# Patient Record
Sex: Female | Born: 1955 | Race: Black or African American | Hispanic: No | Marital: Single | Smoking: Former smoker
Health system: Southern US, Community
[De-identification: ages and names within clinical notes are randomized; demographics above are authoritative.]

## PROBLEM LIST (undated history)

## (undated) DIAGNOSIS — I639 Cerebral infarction, unspecified: Secondary | ICD-10-CM

## (undated) DIAGNOSIS — I10 Essential (primary) hypertension: Secondary | ICD-10-CM

## (undated) HISTORY — PX: TONSILLECTOMY: SUR1361

## (undated) HISTORY — PX: ABDOMINAL HYSTERECTOMY: SHX81

---

## 2019-07-21 ENCOUNTER — Other Ambulatory Visit: Payer: Self-pay

## 2019-07-21 ENCOUNTER — Emergency Department (HOSPITAL_BASED_OUTPATIENT_CLINIC_OR_DEPARTMENT_OTHER): Payer: Commercial Managed Care - PPO

## 2019-07-21 ENCOUNTER — Encounter (HOSPITAL_BASED_OUTPATIENT_CLINIC_OR_DEPARTMENT_OTHER): Payer: Self-pay | Admitting: *Deleted

## 2019-07-21 ENCOUNTER — Inpatient Hospital Stay (HOSPITAL_COMMUNITY): Payer: Commercial Managed Care - PPO

## 2019-07-21 ENCOUNTER — Inpatient Hospital Stay (HOSPITAL_BASED_OUTPATIENT_CLINIC_OR_DEPARTMENT_OTHER)
Admission: EM | Admit: 2019-07-21 | Discharge: 2019-07-25 | DRG: 065 | Disposition: A | Discharge status: Home Health | Payer: Commercial Managed Care - PPO | Attending: Neurology | Admitting: Neurology

## 2019-07-21 DIAGNOSIS — Z6829 Body mass index (BMI) 29.0-29.9, adult: Secondary | ICD-10-CM

## 2019-07-21 DIAGNOSIS — R29702 NIHSS score 2: Secondary | ICD-10-CM | POA: Diagnosis present

## 2019-07-21 DIAGNOSIS — E854 Organ-limited amyloidosis: Secondary | ICD-10-CM | POA: Diagnosis present

## 2019-07-21 DIAGNOSIS — I619 Nontraumatic intracerebral hemorrhage, unspecified: Secondary | ICD-10-CM | POA: Diagnosis present

## 2019-07-21 DIAGNOSIS — Z20828 Contact with and (suspected) exposure to other viral communicable diseases: Secondary | ICD-10-CM | POA: Diagnosis present

## 2019-07-21 DIAGNOSIS — Z79899 Other long term (current) drug therapy: Secondary | ICD-10-CM | POA: Diagnosis not present

## 2019-07-21 DIAGNOSIS — I10 Essential (primary) hypertension: Secondary | ICD-10-CM | POA: Diagnosis present

## 2019-07-21 DIAGNOSIS — I611 Nontraumatic intracerebral hemorrhage in hemisphere, cortical: Principal | ICD-10-CM | POA: Diagnosis present

## 2019-07-21 DIAGNOSIS — I6389 Other cerebral infarction: Secondary | ICD-10-CM | POA: Diagnosis not present

## 2019-07-21 DIAGNOSIS — E669 Obesity, unspecified: Secondary | ICD-10-CM | POA: Diagnosis present

## 2019-07-21 DIAGNOSIS — Z79890 Hormone replacement therapy (postmenopausal): Secondary | ICD-10-CM

## 2019-07-21 DIAGNOSIS — H53462 Homonymous bilateral field defects, left side: Secondary | ICD-10-CM | POA: Diagnosis present

## 2019-07-21 DIAGNOSIS — E871 Hypo-osmolality and hyponatremia: Secondary | ICD-10-CM | POA: Diagnosis present

## 2019-07-21 DIAGNOSIS — I680 Cerebral amyloid angiopathy: Secondary | ICD-10-CM | POA: Diagnosis present

## 2019-07-21 DIAGNOSIS — I629 Nontraumatic intracranial hemorrhage, unspecified: Secondary | ICD-10-CM

## 2019-07-21 DIAGNOSIS — R739 Hyperglycemia, unspecified: Secondary | ICD-10-CM | POA: Diagnosis not present

## 2019-07-21 DIAGNOSIS — E785 Hyperlipidemia, unspecified: Secondary | ICD-10-CM | POA: Diagnosis present

## 2019-07-21 DIAGNOSIS — E7800 Pure hypercholesterolemia, unspecified: Secondary | ICD-10-CM | POA: Diagnosis not present

## 2019-07-21 HISTORY — DX: Essential (primary) hypertension: I10

## 2019-07-21 LAB — DIFFERENTIAL
Abs Immature Granulocytes: 0.02 10*3/uL (ref 0.00–0.07)
Basophils Absolute: 0 10*3/uL (ref 0.0–0.1)
Basophils Relative: 0 %
Eosinophils Absolute: 0 10*3/uL (ref 0.0–0.5)
Eosinophils Relative: 0 %
Immature Granulocytes: 0 %
Lymphocytes Relative: 24 %
Lymphs Abs: 1.8 10*3/uL (ref 0.7–4.0)
Monocytes Absolute: 0.6 10*3/uL (ref 0.1–1.0)
Monocytes Relative: 8 %
Neutro Abs: 5 10*3/uL (ref 1.7–7.7)
Neutrophils Relative %: 68 %

## 2019-07-21 LAB — COMPREHENSIVE METABOLIC PANEL
ALT: 20 U/L (ref 0–44)
AST: 23 U/L (ref 15–41)
Albumin: 4.5 g/dL (ref 3.5–5.0)
Alkaline Phosphatase: 53 U/L (ref 38–126)
Anion gap: 11 (ref 5–15)
BUN: 11 mg/dL (ref 6–20)
CO2: 23 mmol/L (ref 22–32)
Calcium: 9.3 mg/dL (ref 8.9–10.3)
Chloride: 100 mmol/L (ref 98–111)
Creatinine, Ser: 0.91 mg/dL (ref 0.44–1.00)
GFR calc Af Amer: >60 mL/min (ref >60)
GFR calc non Af Amer: >60 mL/min (ref >60)
Glucose, Bld: 172 mg/dL — ABNORMAL HIGH (ref 70–99)
Potassium: 3.9 mmol/L (ref 3.5–5.1)
Sodium: 134 mmol/L — ABNORMAL LOW (ref 135–145)
Total Bilirubin: 1 mg/dL (ref 0.3–1.2)
Total Protein: 8.2 g/dL — ABNORMAL HIGH (ref 6.5–8.1)

## 2019-07-21 LAB — CBG MONITORING, ED: Glucose-Capillary: 155 mg/dL — ABNORMAL HIGH (ref 70–99)

## 2019-07-21 LAB — CBC
HCT: 44.7 % (ref 36.0–46.0)
HCT: 39.7 % (ref 36.0–46.0)
Hemoglobin: 14.3 g/dL (ref 12.0–15.0)
Hemoglobin: 12.8 g/dL (ref 12.0–15.0)
MCH: 28.4 pg (ref 26.0–34.0)
MCH: 28.3 pg (ref 26.0–34.0)
MCHC: 32 g/dL (ref 30.0–36.0)
MCHC: 32.2 g/dL (ref 30.0–36.0)
MCV: 88.9 fL (ref 80.0–100.0)
MCV: 87.6 fL (ref 80.0–100.0)
Platelets: 256 10*3/uL (ref 150–400)
Platelets: 248 10*3/uL (ref 150–400)
RBC: 5.03 MIL/uL (ref 3.87–5.11)
RBC: 4.53 MIL/uL (ref 3.87–5.11)
RDW: 14.4 % (ref 11.5–15.5)
RDW: 14.4 % (ref 11.5–15.5)
WBC: 7.5 10*3/uL (ref 4.0–10.5)
WBC: 7.3 10*3/uL (ref 4.0–10.5)
nRBC: 0 % (ref 0.0–0.2)
nRBC: 0 % (ref 0.0–0.2)

## 2019-07-21 LAB — PROTIME-INR
INR: 1 (ref 0.8–1.2)
Prothrombin Time: 12.6 seconds (ref 11.4–15.2)

## 2019-07-21 LAB — MRSA PCR SCREENING: MRSA by PCR: NEGATIVE

## 2019-07-21 LAB — APTT: aPTT: 27 seconds (ref 24–36)

## 2019-07-21 LAB — SARS CORONAVIRUS 2 AG (30 MIN TAT): SARS Coronavirus 2 Ag: NEGATIVE

## 2019-07-21 LAB — HIV ANTIBODY (ROUTINE TESTING W REFLEX): HIV Screen 4th Generation wRfx: NONREACTIVE

## 2019-07-21 MED ORDER — ACETAMINOPHEN 325 MG PO TABS
650.0000 mg | ORAL_TABLET | ORAL | Status: DC | PRN
  Administered 2019-07-24: 650 mg via ORAL
  Filled 2019-07-21: qty 2

## 2019-07-21 MED ORDER — PANTOPRAZOLE SODIUM 40 MG IV SOLR
40.0000 mg | Freq: Every day | INTRAVENOUS | Status: DC
  Administered 2019-07-21: 40 mg via INTRAVENOUS
  Filled 2019-07-21: qty 40

## 2019-07-21 MED ORDER — ACETAMINOPHEN 650 MG RE SUPP: 650.0000 mg | RECTAL | Status: DC | PRN

## 2019-07-21 MED ORDER — AMLODIPINE BESYLATE 5 MG PO TABS
5.0000 mg | ORAL_TABLET | Freq: Every day | ORAL | Status: DC
  Administered 2019-07-22 – 2019-07-25 (×4): 5 mg via ORAL
  Filled 2019-07-21 (×4): qty 1

## 2019-07-21 MED ORDER — SODIUM CHLORIDE 0.9 % IV BOLUS
1000.0000 mL | Freq: Once | INTRAVENOUS | Status: AC
  Administered 2019-07-21: 1000 mL via INTRAVENOUS

## 2019-07-21 MED ORDER — ACETAMINOPHEN 160 MG/5ML PO SOLN: 650.0000 mg | ORAL | Status: DC | PRN

## 2019-07-21 MED ORDER — ONDANSETRON HCL 4 MG/2ML IJ SOLN
4.0000 mg | Freq: Once | INTRAMUSCULAR | Status: AC
  Administered 2019-07-21: 4 mg via INTRAVENOUS
  Filled 2019-07-21: qty 2

## 2019-07-21 MED ORDER — POTASSIUM CHLORIDE 10 MEQ/100ML IV SOLN
10.0000 meq | INTRAVENOUS | Status: DC
  Administered 2019-07-21: 10 meq via INTRAVENOUS
  Filled 2019-07-21: qty 100

## 2019-07-21 MED ORDER — MORPHINE SULFATE (PF) 4 MG/ML IV SOLN
4.0000 mg | Freq: Once | INTRAVENOUS | Status: AC
  Administered 2019-07-21: 4 mg via INTRAVENOUS
  Filled 2019-07-21: qty 1

## 2019-07-21 MED ORDER — CLEVIDIPINE BUTYRATE 0.5 MG/ML IV EMUL
0.0000 mg/h | INTRAVENOUS | Status: DC
  Administered 2019-07-21: 2 mg/h via INTRAVENOUS
  Administered 2019-07-22: 3 mg/h via INTRAVENOUS
  Filled 2019-07-21: qty 100
  Filled 2019-07-21 (×2): qty 50

## 2019-07-21 MED ORDER — CHLORHEXIDINE GLUCONATE CLOTH 2 % EX PADS
6.0000 | MEDICATED_PAD | Freq: Every day | CUTANEOUS | Status: DC
  Administered 2019-07-21 – 2019-07-24 (×4): 6 via TOPICAL

## 2019-07-21 MED ORDER — SENNOSIDES-DOCUSATE SODIUM 8.6-50 MG PO TABS
1.0000 | ORAL_TABLET | Freq: Two times a day (BID) | ORAL | Status: DC
  Administered 2019-07-21 – 2019-07-24 (×5): 1 via ORAL
  Filled 2019-07-21 (×8): qty 1

## 2019-07-21 MED ORDER — STROKE: EARLY STAGES OF RECOVERY BOOK
Freq: Once | Status: AC
  Administered 2019-07-21: 21:00:00
  Filled 2019-07-21: qty 1

## 2019-07-21 NOTE — H&P (Signed)
Admission H&P    Chief Complaint: Acute right parietal ICH.   HPI: Stacy Flores is an 63 y.o. female who presented this AM to the [facility] ED with confusion. LKN was on Wednesday. Later that day the patient's daughter was contacted by patient's significant other who stated she was having difficulty getting her pants on. She was complaining of a headache. Today, daughter was called again with report that patient was having difficulty walking and had put her pants on backwards. She also fell into the wall. She was taken to the ED, where she reported a right frontal headache with onset last night suddenly. A CT head was obtained, revealing a large right parietal lobe ICH.    Past Medical History:  Diagnosis Date  . Hypertension     Past Surgical History:  Procedure Laterality Date  . ABDOMINAL HYSTERECTOMY    . TONSILLECTOMY      No family history on file. Social History:  reports that she has never smoked. She has never used smokeless tobacco. She reports current alcohol use. She reports that she does not use drugs.  Allergies: No Known Allergies  Home medications: Amlodipine Estradiol  ROS: As per HPI. Endorses no additional complaints.   Physical Examination: Blood pressure 137/82, pulse 95, temperature 99.8 F (37.7 C), temperature source Oral, resp. rate 14, height 5\' 2"  (1.575 m), weight 72.6 kg, SpO2 100 %.  HEENT-  /AT  Lungs - Respirations unlabored Extremities - Warm and well perfused  Neurologic Examination: Mental Status: Awake with decreased level of alertness. Increased latencies of verbal and motor responses. Speech fluent without dysarthria. Able to name objects and follow all commands.  Cranial Nerves: II:  Left visual field cut. PERRL.  III,IV, VI: EOM are full with hesitancy on gazing to the left.  V,VII: Smile symmetric, facial temp sensation equal bilaterally VIII: hearing intact to voice IX,X: No hypophonia XI: Head is at the midline XII: midline tongue  extension Motor: RUE and RLE 5/5 LUE 5/5 except for 4/5 triceps LLE 5/5 except for 3/5 ADF and APF Sensory: Temp decreased to LLE, otherwise normal sensation to temp and FT. Positive for extinction on the left to DSS.    Deep Tendon Reflexes:  2+ bilateral brachioradialis and biceps. 3+ right patellar 4+ left patellar Plantars: Right: downgoing   Left: downgoing Cerebellar: No ataxia with FNF bilaterally  Gait: Deferred  Results for orders placed or performed during the hospital encounter of 07/21/19 (from the past 48 hour(s))  CBG monitoring, ED     Status: Abnormal   Collection Time: 07/21/19 11:33 AM  Result Value Ref Range   Glucose-Capillary 155 (H) 70 - 99 mg/dL  Protime-INR     Status: None   Collection Time: 07/21/19 11:48 AM  Result Value Ref Range   Prothrombin Time 12.6 11.4 - 15.2 seconds   INR 1.0 0.8 - 1.2    Comment: (NOTE) INR goal varies based on device and disease states. Performed at [facility], [address] [place], [place] [address]   APTT     Status: None   Collection Time: 07/21/19 11:48 AM  Result Value Ref Range   aPTT 27 24 - 36 seconds    Comment: Performed at [facility], [address] [place], [place] [address]  CBC     Status: None   Collection Time: 07/21/19 11:48 AM  Result Value Ref Range   WBC 7.5 4.0 - 10.5 K/uL   RBC 5.03  3.87 - 5.11 MIL/uL   Hemoglobin 14.3 12.0 - 15.0 g/dL   HCT 44.7 36.0 - 46.0 %   MCV 88.9 80.0 - 100.0 fL   MCH 28.4 26.0 - 34.0 pg   MCHC 32.0 30.0 - 36.0 g/dL   RDW 14.4 11.5 - 15.5 %   Platelets 256 150 - 400 K/uL   nRBC 0.0 0.0 - 0.2 %    Comment: Performed at [facility], [address] [place], [place] [address]  Differential     Status: None   Collection Time: 07/21/19 11:48 AM  Result Value Ref Range   Neutrophils Relative % 68 %   Neutro Abs 5.0 1.7 - 7.7 K/uL   Lymphocytes Relative 24 %   Lymphs Abs 1.8 0.7 - 4.0 K/uL   Monocytes Relative 8 %   Monocytes  Absolute 0.6 0.1 - 1.0 K/uL   Eosinophils Relative 0 %   Eosinophils Absolute 0.0 0.0 - 0.5 K/uL   Basophils Relative 0 %   Basophils Absolute 0.0 0.0 - 0.1 K/uL   Immature Granulocytes 0 %   Abs Immature Granulocytes 0.02 0.00 - 0.07 K/uL    Comment: Performed at [facility], [address] [place], [place] [address]  Comprehensive metabolic panel     Status: Abnormal   Collection Time: 07/21/19 11:48 AM  Result Value Ref Range   Sodium 134 (L) 135 - 145 mmol/L   Potassium 3.9 3.5 - 5.1 mmol/L   Chloride 100 98 - 111 mmol/L   CO2 23 22 - 32 mmol/L   Glucose, Bld 172 (H) 70 - 99 mg/dL   BUN 11 6 - 20 mg/dL   Creatinine, Ser 0.91 0.44 - 1.00 mg/dL   Calcium 9.3 8.9 - 10.3 mg/dL   Total Protein 8.2 (H) 6.5 - 8.1 g/dL   Albumin 4.5 3.5 - 5.0 g/dL   AST 23 15 - 41 U/L   ALT 20 0 - 44 U/L   Alkaline Phosphatase 53 38 - 126 U/L   Total Bilirubin 1.0 0.3 - 1.2 mg/dL   GFR calc non Af Amer >60 >60 mL/min   GFR calc Af Amer >60 >60 mL/min   Anion gap 11 5 - 15    Comment: Performed at [facility], [address] [place], [place] [address]   CT HEAD WO CONTRAST  Result Date: 07/21/2019 CLINICAL DATA:  Neuro deficit, acute, stroke suspected. Additional history provided: Headache EXAM: CT HEAD WITHOUT CONTRAST TECHNIQUE: Contiguous axial images were obtained from the base of the skull through the vertex without intravenous contrast. COMPARISON:  No pertinent prior studies available for comparison. FINDINGS: Brain: There is an acute parenchymal hemorrhage centered within the right frontoparietal white matter measuring 3.3 x 2.3 x 3.2 cm (AP x TV x CC). Surrounding edema. Regional mass effect with partial effacement of the right lateral ventricle. Mild leftward bowing of the falx without midline shift at the level of the septum pellucidum. A subtle focus of cortical/subcortical edema is also questioned within the right occipital lobe (series 5, image 17). Additional  ill-defined hypoattenuation within the cerebral white matter is nonspecific, but consistent with chronic small vessel ischemic disease. No evidence of hydrocephalus. No extra-axial fluid collection. Cerebral volume is normal for age. Vascular: No definite hyperdense vessel. Skull: Normal. Negative for fracture or focal lesion. Sinuses/Orbits: Visualized orbits demonstrate no acute abnormality. Other: Opacification of a posterior right ethmoid air cell. No significant mastoid effusion. These results  were called by telephone at the time of interpretation on 07/21/2019 at 12:30 pm to provider Dr. Dalene Seltzer, Who verbally acknowledged these results. IMPRESSION: 3.3 x 2.3 x 3.2 cm acute parenchymal hemorrhage centered within the right frontoparietal white matter. Surrounding edema. Regional mass effect with leftward bowing of the falx but no midline shift at the level of the septum pellucidum. Given the hemorrhage location, consider MR venography to exclude venous thrombosis. Contrast-enhanced brain MRI is also recommended as the hematoma involutes to exclude an underlying lesion A subtle focus of cortical/subcortical edema is questioned within the right occipital lobe. This is nonspecific, but may reflect a small infarct. This too could be further evaluated with brain MRI. Electronically Signed   By: Jackey Loge DO   On: 07/21/2019 12:32   Assessment: 63 year old female with acute right parietal ICH.  1. Exam reveals expected deficits from the right parietal ICH.  2. CT head: 3.3 x 2.3 x 3.2 cm acute parenchymal hemorrhage centered within the right frontoparietal white matter. Surrounding edema. Regional mass effect with leftward bowing of the falx but no midline shift at the level of the septum pellucidum. Given the hemorrhage location, consider MR venography to exclude venous thrombosis. Contrast-enhanced brain MRI is also recommended as the hematoma involutes to exclude an underlying lesion 3. Also noted on CT  head: A subtle focus of cortical/subcortical edema is questioned within the right occipital lobe. This is nonspecific, but may reflect a small infarct. This too could be further evaluated with brain MRI.  Recommendations: 1. Admit to 4N under the Neurology service.  2. Brain MRV with and without contrast to rule out venous sinus thrombosis.  3. MRI brain, MRA head.  4. Carotid ultrasound 5. TTE 6. Cardiac telemetry 7. Frequent neuro checks 8. Hold of on hypertonic saline for now.  9. Repeat head CT in 24 hours.  10. BP management with clevidipine drip if needed. Goal SBP < 140 11. No antiplatelet medications or anticoagulants 12. PT consult, OT consult, Speech consult  50 minutes spent in the emergent neurological evaluation and management of this critically ill patient.   Electronically signed: Dr. Caryl Pina 07/21/2019, 12:51 PM

## 2019-07-21 NOTE — ED Notes (Signed)
Swabbed for COVID tolerated well.

## 2019-07-21 NOTE — ED Notes (Signed)
Pt resting, asked family to let usknow when can provide urine sample.

## 2019-07-21 NOTE — ED Triage Notes (Signed)
She woke confused. She was last seen normal yesterday and told her daughter her back hurt. She is slow to respond but responds appropriately. C.o headache.

## 2019-07-21 NOTE — Progress Notes (Signed)
Patient arrived to 4NICU around 74.  Patient is alert and oriented. Patient has some delay response and visual field cut on the leftside, see NIH documentation.  Neurology paged and alerted that patient has arrived from [facility].

## 2019-07-21 NOTE — ED Provider Notes (Signed)
[facility] EMERGENCY DEPARTMENT Provider Note   CSN: [id number] Arrival date & time: 07/21/19  1119     History Chief Complaint  Patient presents with  . Weakness    Stacy Flores is a 63 y.o. female.  63 year old female with past medical history of hypertension brought in by daughter for headache and weakness.  Daughter reports family attended a funeral on Tuesday, on Wednesday daughter was contacted by patient's significant other who stated she was having difficulty getting her pants on, patient stated this was because her back hurt her.  Daughter was called again today when patient is having difficulty walking, noted to have put her pants on backwards, was falling into the wall.  Patient reports right frontal headache onset last night, sudden onset, no similar headaches previously, described as "nagging" in nature.  Not associated with nausea or vomiting. Difficult to assess last known well, report of difficulty putting pants on on Wednesday which patient reported to be due to back pain from working too much; headache onset last night with difficulty ambulating today.        Past Medical History:  Diagnosis Date  . Hypertension     There are no problems to display for this patient.   Past Surgical History:  Procedure Laterality Date  . ABDOMINAL HYSTERECTOMY    . TONSILLECTOMY       OB History   No obstetric history on file.     No family history on file.  Social History   Tobacco Use  . Smoking status: Never Smoker  . Smokeless tobacco: Never Used  Substance Use Topics  . Alcohol use: Yes  . Drug use: Never    Home Medications Prior to Admission medications   Medication Sig Start Date End Date Taking? Authorizing Provider  amLODipine (NORVASC) 5 MG tablet Take by mouth. 12/18/16  Yes [provider]  estradiol (ESTRACE) 0.5 MG tablet Take by mouth. 12/26/16  Yes [provider]    Allergies    Patient has no known  allergies.  Review of Systems   Review of Systems  Constitutional: Negative for fever.  Respiratory: Negative for shortness of breath.   Cardiovascular: Negative for chest pain.  Gastrointestinal: Negative for abdominal pain.  Musculoskeletal: Positive for back pain and gait problem. Negative for neck pain and neck stiffness.  Skin: Negative for rash and wound.  Allergic/Immunologic: Negative for immunocompromised state.  Neurological: Positive for weakness and headaches.  Psychiatric/Behavioral: Negative for confusion.  All other systems reviewed and are negative.   Physical Exam Updated Vital Signs BP 127/75   Pulse 99   Temp 99.8 F (37.7 C) (Oral)   Resp 20   Ht 5\' 2"  (1.575 m)   Wt 72.6 kg   SpO2 99%   BMI 29.26 kg/m   Physical Exam Vitals and nursing note reviewed.  Constitutional:      General: She is not in acute distress.    Appearance: She is well-developed. She is not diaphoretic.  HENT:     Head: Normocephalic and atraumatic.  Eyes:     Extraocular Movements: Extraocular movements intact.     Pupils: Pupils are equal, round, and reactive to light.  Cardiovascular:     Rate and Rhythm: Normal rate and regular rhythm.     Pulses: Normal pulses.     Heart sounds: Normal heart sounds.  Pulmonary:     Effort: Pulmonary effort is normal.     Breath sounds: Normal breath sounds.  Abdominal:  Tenderness: There is no abdominal tenderness.  Musculoskeletal:     Right lower leg: No edema.     Left lower leg: No edema.  Skin:    General: Skin is warm and dry.  Neurological:     Mental Status: She is alert.     GCS: GCS eye subscore is 4. GCS verbal subscore is 4. GCS motor subscore is 6.     Sensory: No sensory deficit.     Motor: Weakness present. No pronator drift.     Coordination: Coordination abnormal.     Gait: Gait abnormal.     Comments: Oriented to person and place ([facility]). Left leg and arm with questionable weakness compared to right, delay  reaction when asked to move arm and leg, no arm drift. Difficulty with nose to finger on left, unable to answer/assess peripheral vision.  Psychiatric:        Behavior: Behavior normal.     ED Results / Procedures / Treatments   Labs (all labs ordered are listed, but only abnormal results are displayed) Labs Reviewed  COMPREHENSIVE METABOLIC PANEL - Abnormal; Notable for the following components:      Result Value   Sodium 134 (*)    Glucose, Bld 172 (*)    Total Protein 8.2 (*)    All other components within normal limits  CBG MONITORING, ED - Abnormal; Notable for the following components:   Glucose-Capillary 155 (*)    All other components within normal limits  SARS CORONAVIRUS 2 AG (30 MIN TAT)  SARS CORONAVIRUS 2 (TAT 6-24 HRS)  PROTIME-INR  APTT  CBC  DIFFERENTIAL  ETHANOL  RAPID URINE DRUG SCREEN, HOSP PERFORMED  URINALYSIS, ROUTINE W REFLEX MICROSCOPIC  CBG MONITORING, ED    EKG EKG Interpretation  Date/Time:  Thursday July 21 2019 11:41:39 EST Ventricular Rate:  73 PR Interval:    QRS Duration: 87 QT Interval:  366 QTC Calculation: 404 R Axis:   25 Text Interpretation: Sinus rhythm Borderline short PR interval Abnrm T, consider ischemia, anterolateral lds No previous ECGs available Confirmed by Alvira Monday ([id number]) on 07/21/2019 12:21:22 PM   Radiology CT HEAD WO CONTRAST  Result Date: 07/21/2019 CLINICAL DATA:  Neuro deficit, acute, stroke suspected. Additional history provided: Headache EXAM: CT HEAD WITHOUT CONTRAST TECHNIQUE: Contiguous axial images were obtained from the base of the skull through the vertex without intravenous contrast. COMPARISON:  No pertinent prior studies available for comparison. FINDINGS: Brain: There is an acute parenchymal hemorrhage centered within the right frontoparietal white matter measuring 3.3 x 2.3 x 3.2 cm (AP x TV x CC). Surrounding edema. Regional mass effect with partial effacement of the right lateral  ventricle. Mild leftward bowing of the falx without midline shift at the level of the septum pellucidum. A subtle focus of cortical/subcortical edema is also questioned within the right occipital lobe (series 5, image 17). Additional ill-defined hypoattenuation within the cerebral white matter is nonspecific, but consistent with chronic small vessel ischemic disease. No evidence of hydrocephalus. No extra-axial fluid collection. Cerebral volume is normal for age. Vascular: No definite hyperdense vessel. Skull: Normal. Negative for fracture or focal lesion. Sinuses/Orbits: Visualized orbits demonstrate no acute abnormality. Other: Opacification of a posterior right ethmoid air cell. No significant mastoid effusion. These results were called by telephone at the time of interpretation on 07/21/2019 at 12:30 pm to provider Dr. Dalene Seltzer, Who verbally acknowledged these results. IMPRESSION: 3.3 x 2.3 x 3.2 cm acute parenchymal hemorrhage centered within the right frontoparietal white matter.  Surrounding edema. Regional mass effect with leftward bowing of the falx but no midline shift at the level of the septum pellucidum. Given the hemorrhage location, consider MR venography to exclude venous thrombosis. Contrast-enhanced brain MRI is also recommended as the hematoma involutes to exclude an underlying lesion A subtle focus of cortical/subcortical edema is questioned within the right occipital lobe. This is nonspecific, but may reflect a small infarct. This too could be further evaluated with brain MRI. Electronically Signed   By: Kellie Simmering DO   On: 07/21/2019 12:32    Procedures .Critical Care Performed by: Tacy Learn, PA-C Authorized by: Tacy Learn, PA-C   Critical care provider statement:    Critical care time (minutes):  45   Critical care was time spent personally by me on the following activities:  Discussions with consultants, evaluation of patient's response to treatment, examination of  patient, ordering and performing treatments and interventions, ordering and review of laboratory studies, ordering and review of radiographic studies, pulse oximetry, re-evaluation of patient's condition, obtaining history from patient or surrogate and review of old charts   (including critical care time)  Medications Ordered in ED Medications  ondansetron (ZOFRAN) injection 4 mg (has no administration in time range)  morphine 4 MG/ML injection 4 mg (has no administration in time range)  potassium chloride 10 mEq in 100 mL IVPB (has no administration in time range)  sodium chloride 0.9 % bolus 1,000 mL (has no administration in time range)    ED Course  I have reviewed the triage vital signs and the nursing notes.  Pertinent labs & imaging results that were available during my care of the patient were reviewed by me and considered in my medical decision making (see chart for details).  Clinical Course as of Jul 20 1316  Thu Jul 21, 2019  1309 62yo female with complaint of headache behind the right eye onset last night and difficulty walking today. Unable to put on her pants yesterday, reportedly due to pain in her back from working too much. Daughter provides majority of history as patient is slow to answer questions and gives very brief responses. She has a history of hypertension, is not anticoagulated. On exam, no arm or leg drift, left arm and left leg require specific prompting to push/pull/squeeze. Finger to nose abnormal on left (has to carefully watch for my hand to make contact anywhere on my hand), does not participate in peripheral vision check on either eye. No changes in sensation. No facial weakness. CT with intracranial hemorrhage, discussed with Dr. Billy Fischer, ER attending, plan to consult neurosurgery and neurology at [facility]. BP currently 137/82. Case discussed with Dr. Cheral Marker, neuro service accepts patient to 4N at [facility], call on arrival, recommends NS at 125/hr, Clovidipine  drip if needed for BP management.  Case discussed with Dr. Arnoldo Morale with neurosurgery, non operative, neuro to follow.  Patient and family updated with plan of care. Patient given Zofran and Morphine for her headache.   [LM]    Clinical Course User Index [LM] Roque Lias   MDM Rules/Calculators/A&P                            Final Clinical Impression(s) / ED Diagnoses Final diagnoses:  Intracranial hemorrhage [facility])    Rx / DC Orders ED Discharge Orders    None       Tacy Learn, PA-C 07/21/19 1754    Billy Fischer, Junie Panning,  MD 07/21/19 2247

## 2019-07-21 NOTE — ED Notes (Signed)
Report to TK, Rn at [facility]

## 2019-07-22 ENCOUNTER — Inpatient Hospital Stay (HOSPITAL_COMMUNITY): Payer: Commercial Managed Care - PPO

## 2019-07-22 DIAGNOSIS — I629 Nontraumatic intracranial hemorrhage, unspecified: Secondary | ICD-10-CM

## 2019-07-22 MED ORDER — GADOBUTROL 1 MMOL/ML IV SOLN
7.0000 mL | Freq: Once | INTRAVENOUS | Status: AC | PRN
  Administered 2019-07-22: 7 mL via INTRAVENOUS

## 2019-07-22 MED ORDER — DOCUSATE SODIUM 100 MG PO CAPS
100.0000 mg | ORAL_CAPSULE | Freq: Every day | ORAL | Status: DC
  Administered 2019-07-22: 100 mg via ORAL
  Filled 2019-07-22 (×4): qty 1

## 2019-07-22 MED ORDER — LABETALOL HCL 5 MG/ML IV SOLN: 20.0000 mg | INTRAVENOUS | Status: DC | PRN

## 2019-07-22 MED ORDER — HYDRALAZINE HCL 20 MG/ML IJ SOLN: 20.0000 mg | Freq: Three times a day (TID) | INTRAMUSCULAR | Status: DC | PRN

## 2019-07-22 MED ORDER — PANTOPRAZOLE SODIUM 40 MG PO TBEC
40.0000 mg | DELAYED_RELEASE_TABLET | Freq: Every day | ORAL | Status: DC
  Administered 2019-07-22 – 2019-07-24 (×3): 40 mg via ORAL
  Filled 2019-07-22 (×3): qty 1

## 2019-07-22 NOTE — Plan of Care (Signed)
  Problem: Clinical Measurements: Goal: Respiratory complications will improve Outcome: Progressing   Problem: Activity: Goal: Risk for activity intolerance will decrease Outcome: Progressing   Problem: Nutrition: Goal: Adequate nutrition will be maintained Outcome: Progressing   Problem: Coping: Goal: Level of anxiety will decrease Outcome: Progressing   Problem: Pain Managment: Goal: General experience of comfort will improve Outcome: Progressing   Problem: Safety: Goal: Ability to remain free from injury will improve Outcome: Progressing   

## 2019-07-22 NOTE — Evaluation (Addendum)
Occupational Therapy Evaluation Patient Details Name: Stacy Flores MRN: [id number] DOB: 1955/12/21 Today's Date: 07/22/2019    History of Present Illness 63 y.o. female who presented 12/10 to the [facility] ED with confusion. CT revealed large right parietal lobe ICH.   Clinical Impression   PTA pt fully independent with all BADL/IADL, living with boyfriend and very active. At time of eval, she presents with L sided sensory deficits significantly impacting safety for BADL engagement. She is able to complete bed mobility at min guard and transfers at min A. She required min A with slow steady cadence when completing functional mobility in the hallway. She was observed to be unaware of L sided hazards when navigating the environment. Visual assessment completed as noted below. Pt having difficulty with pursuits and also has poor attention to complete assessment. Education given to pt on L sided deficits, still needing maximum verbal safety cues to correct throughout functional tasks. This impacts her BADL completion, due to constant need of max cues to maintain safety and prevent falls. She also presents with cognitive deficits in attention and awareness of deficits. Pt is seen inappropriately laughing and very poor insight into potential fall risks. Given previous independent baseline, recommend CIR for intensive neuro rehabilitation to address safety in BADL, cognition, L sided sensory deficits to reduce change of readmission. Will continue to follow acutely per POC listed below.    Follow Up Recommendations  CIR;Supervision/Assistance - 24 hour    Equipment Recommendations  Other (comment)(TBD)    Recommendations for Other Services       Precautions / Restrictions Precautions Precautions: Fall;Other (comment) Precaution Comments: L inattention Restrictions Weight Bearing Restrictions: No      Mobility Bed Mobility Overal bed mobility: Needs Assistance Bed Mobility: Supine to Sit      Supine to sit: HOB elevated;Min guard     General bed mobility comments: +rail, min guard for safety  Transfers Overall transfer level: Needs assistance Equipment used: 2 person hand held assist Transfers: Sit to/from Stand Sit to Stand: Min assist         General transfer comment: min assist to power up and stabilize balance    Balance Overall balance assessment: Needs assistance Sitting-balance support: No upper extremity supported;Feet supported Sitting balance-Leahy Scale: Fair Sitting balance - Comments: L sided inattention   Standing balance support: During functional activity;No upper extremity supported Standing balance-Leahy Scale: Fair Standing balance comment: can maintain static standing, does bump into L sided objects                           ADL either performed or assessed with clinical judgement   ADL Overall ADL's : Needs assistance/impaired Eating/Feeding: Set up;Sitting Eating/Feeding Details (indicate cue type and reason): cues to scan entirety of tray Grooming: Minimal assistance;Standing Grooming Details (indicate cue type and reason): standing at sink, needing min-mod cues to attend to L side for BADL items Upper Body Bathing: Minimal assistance;Sitting   Lower Body Bathing: Minimal assistance;Sit to/from stand;Sitting/lateral leans   Upper Body Dressing : Minimal assistance;Sitting   Lower Body Dressing: Minimal assistance;Sit to/from stand;Sitting/lateral leans   Toilet Transfer: Minimal assistance;Regular Toilet   Toileting- Clothing Manipulation and Hygiene: Minimal assistance;Sit to/from stand;Sitting/lateral lean   Tub/ Shower Transfer: Minimal assistance;Shower seat;Ambulation   Functional mobility during ADLs: Minimal assistance;Cueing for safety;Cueing for sequencing General ADL Comments: pt presents with L sided sensory deficits impacting safety in BADL     Vision Baseline Vision/History: Wears glasses  Wears Glasses:  Reading only(has bifocals, but not wearing all the time because she thinks she needs reassessed) Patient Visual Report: Other (comment)(pt not aware) Vision Assessment?: Yes Tracking/Visual Pursuits: Decreased smoothness of eye movement to LEFT superior field;Decreased smoothness of eye movement to LEFT inferior field;Impaired - to be further tested in functional context;Requires cues, head turns, or add eye shifts to track Saccades: Additional head turns occurred during testing Additional Comments: pt appears to have L sided sensory deficits/inattention     Perception     Praxis      Pertinent Vitals/Pain Pain Assessment: No/denies pain Faces Pain Scale: No hurt     Hand Dominance Right   Extremity/Trunk Assessment Upper Extremity Assessment Upper Extremity Assessment: LUE deficits/detail LUE Deficits / Details: presents with inattention and sensory deficits. Able to attend with multimodal cueing   Lower Extremity Assessment Lower Extremity Assessment: Defer to PT evaluation LLE Deficits / Details: 5/5 hip and knee, 3/5 ankle   Cervical / Trunk Assessment Cervical / Trunk Assessment: Normal   Communication Communication Communication: No difficulties   Cognition Arousal/Alertness: Awake/alert Behavior During Therapy: WFL for tasks assessed/performed Overall Cognitive Status: Impaired/Different from baseline Area of Impairment: Safety/judgement;Attention                   Current Attention Level: Sustained     Safety/Judgement: Decreased awareness of deficits;Decreased awareness of safety     General Comments: pt somewhat labile, laughing at inappropriate times. Presents with poor attention and awareness of deficits   General Comments  VSS    Exercises     Shoulder Instructions      Home Living Family/patient expects to be discharged to:: Private residence Living Arrangements: Spouse/significant other Available Help at Discharge: Family;Available 24  hours/day Type of Home: House Home Access: Stairs to enter [employer] of Steps: 1 (threshold) Entrance Stairs-Rails: None Home Layout: One level     Bathroom Shower/Tub: IT trainer: Standard     Home Equipment: None          Prior Functioning/Environment Level of Independence: Independent        Comments: Drives, grocery shops, active        OT Problem List: Decreased strength;Impaired vision/perception;Decreased knowledge of use of DME or AE;Decreased coordination;Decreased knowledge of precautions;Decreased activity tolerance;Decreased cognition;Impaired UE functional use;Impaired balance (sitting and/or standing);Decreased safety awareness;Impaired sensation      OT Treatment/Interventions: Self-care/ADL training;Visual/perceptual remediation/compensation;Therapeutic exercise;Patient/family education;Neuromuscular education;Balance training;Energy conservation;Therapeutic activities;DME and/or AE instruction;Cognitive remediation/compensation    OT Goals(Current goals can be found in the care plan section) Acute Rehab OT Goals Patient Stated Goal: return to high level of ind OT Goal Formulation: With patient Time For Goal Achievement: 08/05/19 Potential to Achieve Goals: Good  OT Frequency: Min 2X/week   Barriers to D/C:            Co-evaluation PT/OT/SLP Co-Evaluation/Treatment: Yes Reason for Co-Treatment: For patient/therapist safety;To address functional/ADL transfers PT goals addressed during session: Mobility/safety with mobility;Balance OT goals addressed during session: ADL's and self-care;Strengthening/ROM      AM-PAC OT "6 Clicks" Daily Activity     Outcome Measure Help from another person eating meals?: A Little Help from another person taking care of personal grooming?: A Little Help from another person toileting, which includes using toliet, bedpan, or urinal?: A Little Help from another person bathing  (including washing, rinsing, drying)?: A Little Help from another person to put on and taking off regular upper body clothing?: A Little Help from  another person to put on and taking off regular lower body clothing?: A Little 6 Click Score: 18   End of Session Equipment Utilized During Treatment: Gait belt Nurse Communication: Mobility status  Activity Tolerance: Patient tolerated treatment well Patient left: in chair;with call bell/phone within reach;with chair alarm set  OT Visit Diagnosis: Unsteadiness on feet (R26.81);Other abnormalities of gait and mobility (R26.89);Other symptoms and signs involving cognitive function                Time: 0951-1010 OT Time Calculation (min): 19 min Charges:  OT General Charges $OT Visit: 1 Visit OT Evaluation $OT Eval Moderate Complexity: 1 Mod  Dalphine HandingKaylee Johnisha Louks, MSOT, OTR/L Behavioral Health OT/ Acute Relief OT [facility] Office: [phone]  Dalphine HandingKaylee Marine Lezotte 07/22/2019, 1:34 PM

## 2019-07-22 NOTE — Consult Note (Signed)
Reason for Consult: Right intracerebral hemorrhage Referring Physician: Dr. [name] is an 63 y.o. female.  HPI: Stacy Flores is a 63 year old black female who began acting strangely yesterday.  She was brought to [facility] and a head CT was obtained which demonstrated a right posterior frontal/parietal cranial hemorrhage with mild mass-effect.  I was contacted by Stacy ER doctor and reviewed Stacy films.  Presently Stacy Flores is alert and pleasant.  She has no complaints.  Past Medical History:  Diagnosis Date  . Hypertension     Past Surgical History:  Procedure Laterality Date  . ABDOMINAL HYSTERECTOMY    . TONSILLECTOMY      No family history on file.  Social History:  reports that she has never smoked. She has never used smokeless tobacco. She reports current alcohol use. She reports that she does not use drugs.  Allergies: No Known Allergies  Medications:  I have reviewed Stacy Flores's current medications. Prior to Admission:  Medications Prior to Admission  Medication Sig Dispense Refill Last Dose  . amLODipine (NORVASC) 5 MG tablet Take by mouth.   07/21/2019 at Unknown time  . estradiol (ESTRACE) 0.5 MG tablet Take 0.5 mg by mouth daily.    Past Week at Unknown time   Scheduled: . amLODipine  5 mg Oral Daily  . Chlorhexidine Gluconate Cloth  6 each Topical Daily  . pantoprazole (PROTONIX) IV  40 mg Intravenous QHS  . senna-docusate  1 tablet Oral BID   Continuous: . clevidipine 3 mg/hr (07/22/19 0330)   [id number] **OR** acetaminophen (TYLENOL) oral liquid 160 mg/5 mL **OR** acetaminophen Anti-infectives (From admission, onward)   None       Results for orders placed or performed during Stacy hospital encounter of 07/21/19 (from Stacy past 48 hour(s))  CBG monitoring, ED     Status: Abnormal   Collection Time: 07/21/19 11:33 AM  Result Value Ref Range   Glucose-Capillary 155 (H) 70 - 99 mg/dL  Protime-INR     Status: None    Collection Time: 07/21/19 11:48 AM  Result Value Ref Range   Prothrombin Time 12.6 11.4 - 15.2 seconds   INR 1.0 0.8 - 1.2    Comment: (NOTE) INR goal varies based on device and disease states. Performed at [facility], [address] [place], [place] [address]   APTT     Status: None   Collection Time: 07/21/19 11:48 AM  Result Value Ref Range   aPTT 27 24 - 36 seconds    Comment: Performed at [facility], [address] [place], [place] [address]  CBC     Status: None   Collection Time: 07/21/19 11:48 AM  Result Value Ref Range   WBC 7.5 4.0 - 10.5 K/uL   RBC 5.03 3.87 - 5.11 MIL/uL   Hemoglobin 14.3 12.0 - 15.0 g/dL   HCT 44.7 36.0 - 46.0 %   MCV 88.9 80.0 - 100.0 fL   MCH 28.4 26.0 - 34.0 pg   MCHC 32.0 30.0 - 36.0 g/dL   RDW 14.4 11.5 - 15.5 %   Platelets 256 150 - 400 K/uL   nRBC 0.0 0.0 - 0.2 %    Comment: Performed at [facility], [address] [place], [place] [address]  Differential     Status: None   Collection Time: 07/21/19 11:48 AM  Result Value Ref Range   Neutrophils Relative % 68 %   Neutro  Abs 5.0 1.7 - 7.7 K/uL   Lymphocytes Relative 24 %   Lymphs Abs 1.8 0.7 - 4.0 K/uL   Monocytes Relative 8 %   Monocytes Absolute 0.6 0.1 - 1.0 K/uL   Eosinophils Relative 0 %   Eosinophils Absolute 0.0 0.0 - 0.5 K/uL   Basophils Relative 0 %   Basophils Absolute 0.0 0.0 - 0.1 K/uL   Immature Granulocytes 0 %   Abs Immature Granulocytes 0.02 0.00 - 0.07 K/uL    Comment: Performed at [facility], [address] [place], [place] [address]  Comprehensive metabolic panel     Status: Abnormal   Collection Time: 07/21/19 11:48 AM  Result Value Ref Range   Sodium 134 (L) 135 - 145 mmol/L   Potassium 3.9 3.5 - 5.1 mmol/L   Chloride 100 98 - 111 mmol/L   CO2 23 22 - 32 mmol/L   Glucose, Bld 172 (H) 70 - 99 mg/dL   BUN 11 6 - 20 mg/dL   Creatinine, Ser 0.91 0.44 - 1.00 mg/dL   Calcium 9.3 8.9 - 10.3 mg/dL   Total  Protein 8.2 (H) 6.5 - 8.1 g/dL   Albumin 4.5 3.5 - 5.0 g/dL   AST 23 15 - 41 U/L   ALT 20 0 - 44 U/L   Alkaline Phosphatase 53 38 - 126 U/L   Total Bilirubin 1.0 0.3 - 1.2 mg/dL   GFR calc non Af Amer >60 >60 mL/min   GFR calc Af Amer >60 >60 mL/min   Anion gap 11 5 - 15    Comment: Performed at [facility], [address] [place], [place] [address]  SARS Coronavirus 2 Ag (30 min TAT) - Nasal Swab (BD Veritor Kit)     Status: None   Collection Time: 07/21/19  1:35 PM   Specimen: Nasal Swab (BD Veritor Kit)  Result Value Ref Range   SARS Coronavirus 2 Ag NEGATIVE NEGATIVE    Comment: (NOTE) SARS-CoV-2 antigen NOT DETECTED.  Negative results are presumptive.  Negative results do not preclude SARS-CoV-2 infection and should not be used as Stacy sole basis for treatment or other Flores management decisions, including infection  control decisions, particularly in Stacy presence of clinical signs and  symptoms consistent with COVID-19, or in those who have been in contact with Stacy virus.  Negative results must be combined with clinical observations, Flores history, and epidemiological information. Stacy expected result is Negative. Fact Sheet for Patients: PodPark.tn Fact Sheet for Healthcare Providers: GiftContent.is This test is not yet approved or cleared by Stacy [place] FDA and  has been authorized for detection and/or diagnosis of SARS-CoV-2 by FDA under an Emergency Use Authorization (EUA).  This EUA will remain in effect (meaning this test can be used) for Stacy duration of  Stacy COVID-19 de claration under Section 564(b)(1) of Stacy Act, 21 U.S.C. section 360bbb-3(b)(1), unless Stacy authorization is terminated or revoked sooner. Performed at [facility], [address] [place], [place] [address]   MRSA PCR Screening     Status: None   Collection Time: 07/21/19  6:55 PM   Specimen: Nasal Mucosa;  Nasopharyngeal  Result Value Ref Range   MRSA by PCR NEGATIVE NEGATIVE    Comment:        Stacy GeneXpert MRSA Assay (FDA approved for NASAL specimens only), is one component of a comprehensive MRSA colonization surveillance program. It is not intended to diagnose MRSA infection nor to guide  or monitor treatment for MRSA infections. Performed at [facility], [place] [address], [place], [place] [address]   HIV Antibody (routine testing w rflx)     Status: None   Collection Time: 07/21/19  8:35 PM  Result Value Ref Range   HIV Screen 4th Generation wRfx NON REACTIVE NON REACTIVE    Comment: Performed at [facility], [place] [address], [place] [address]  CBC     Status: None   Collection Time: 07/21/19  8:35 PM  Result Value Ref Range   WBC 7.3 4.0 - 10.5 K/uL   RBC 4.53 3.87 - 5.11 MIL/uL   Hemoglobin 12.8 12.0 - 15.0 g/dL   HCT 39.7 36.0 - 46.0 %   MCV 87.6 80.0 - 100.0 fL   MCH 28.3 26.0 - 34.0 pg   MCHC 32.2 30.0 - 36.0 g/dL   RDW 14.4 11.5 - 15.5 %   Platelets 248 150 - 400 K/uL   nRBC 0.0 0.0 - 0.2 %    Comment: Performed at [facility], [place] [address] [place], [place] [address]    CT HEAD WO CONTRAST  Result Date: 07/21/2019 CLINICAL DATA:  Neuro deficit, acute, stroke suspected. Additional history provided: Headache EXAM: CT HEAD WITHOUT CONTRAST TECHNIQUE: Contiguous axial images were obtained from Stacy base of Stacy skull through Stacy vertex without intravenous contrast. COMPARISON:  No pertinent prior studies available for comparison. FINDINGS: Brain: There is an acute parenchymal hemorrhage centered within Stacy right frontoparietal white matter measuring 3.3 x 2.3 x 3.2 cm (AP x TV x CC). Surrounding edema. Regional mass effect with partial effacement of Stacy right lateral ventricle. Mild leftward bowing of Stacy falx without midline shift at Stacy level of Stacy septum pellucidum. A subtle focus of cortical/subcortical edema is also questioned within Stacy  right occipital lobe (series 5, image 17). Additional ill-defined hypoattenuation within Stacy cerebral white matter is nonspecific, but consistent with chronic small vessel ischemic disease. No evidence of hydrocephalus. No extra-axial fluid collection. Cerebral volume is normal for age. Vascular: No definite hyperdense vessel. Skull: Normal. Negative for fracture or focal lesion. Sinuses/Orbits: Visualized orbits demonstrate no acute abnormality. Other: Opacification of a posterior right ethmoid air cell. No significant mastoid effusion. These results were called by telephone at Stacy time of interpretation on 07/21/2019 at 12:30 pm to provider Dr. Billy Fischer, Who verbally acknowledged these results. IMPRESSION: 3.3 x 2.3 x 3.2 cm acute parenchymal hemorrhage centered within Stacy right frontoparietal white matter. Surrounding edema. Regional mass effect with leftward bowing of Stacy falx but no midline shift at Stacy level of Stacy septum pellucidum. Given Stacy hemorrhage location, consider MR venography to exclude venous thrombosis. Contrast-enhanced brain MRI is also recommended as Stacy hematoma involutes to exclude an underlying lesion A subtle focus of cortical/subcortical edema is questioned within Stacy right occipital lobe. This is nonspecific, but may reflect a small infarct. This too could be further evaluated with brain MRI. Electronically Signed   By: Kellie Simmering DO   On: 07/21/2019 12:32   DG Chest Port 1 View  Result Date: 07/21/2019 CLINICAL DATA:  Intracranial hemorrhage EXAM: PORTABLE CHEST 1 VIEW COMPARISON:  Portable exam 2007 hours compared to 12/16/2016 FINDINGS: Normal heart size, mediastinal contours, and pulmonary vascularity. Lungs clear. No infiltrate, pleural effusion or pneumothorax. Minimal chronic dextroconvex thoracic scoliosis. IMPRESSION: No acute abnormalities. Electronically Signed   By: Lavonia Dana M.D.   On: 07/21/2019 20:23    ROS: As above Blood pressure  132/74, pulse 92,  temperature 98.1 F (36.7 C), temperature source Oral, resp. rate 13, height _0  (1.575 m), weight 72.6 kg, SpO2 98 %. Estimated body mass index is 29.26 kg/m as calculated from Stacy following:   Height as of this encounter: _1  (1.575 m).   Weight as of this encounter: 72.6 kg.  Physical Exam  General: An alert and pleasant 63 year old black female in no apparent distress  HEENT: Normocephalic, atraumatic, pupils are equal, extraocular muscles intact  Neck: Supple without obvious deformity  Thorax: Symmetric  Abdomen: Soft  Extremities: Unremarkable  Neurologic exam Stacy Flores is alert and oriented x2, person and a hospital, Glasgow Coma Scale 14, [id number].  Stacy Flores's motor strength is grossly normal in her bilateral bicep, handgrip, gastrocnemius and dorsiflexors.  Cerebellar function is intact to rapid altering movements of Stacy upper extremities bilaterally.  Sensory function is intact to light touch sensation all tested dermatomes bilaterally.  Cranial nerve exam was grossly normal.  There is limited evaluation of her visual fields.  I have reviewed Stacy Flores's head CT performed yesterday at [facility].  She has a proximally 3 cm cerebral hemorrhage in Stacy right posterior frontal/parietal region with mild edema and without significant mass-effect.  Assessment/Plan: Right posterior frontal/parietal intracerebral hemorrhage: Stacy Flores is doing fairly well.  Presently Stacy Flores would not benefit from surgery.  Neurology is managing her stroke.  Ophelia Charter 07/22/2019, 7:40 AM

## 2019-07-22 NOTE — Evaluation (Signed)
Speech Language Pathology Evaluation Patient Details Name: Stacy Flores MRN: [id number] DOB: 1956-03-14 Today's Date: 07/22/2019 Time: 9417-4081 SLP Time Calculation (min) (ACUTE ONLY): 30 min  Problem List:  Patient Active Problem List   Diagnosis Date Noted  . Intracranial hemorrhage ([place]) 07/21/2019  . ICH (intracerebral hemorrhage) ([place]) 07/21/2019   Past Medical History:  Past Medical History:  Diagnosis Date  . Hypertension    Past Surgical History:  Past Surgical History:  Procedure Laterality Date  . ABDOMINAL HYSTERECTOMY    . TONSILLECTOMY     HPI:  Stacy Flores is an 63 y.o. female who presented this AM to the [facility] ED with confusion. LKN was on Wednesday. Later that day the patient's daughter was contacted by patient's significant other who stated she was having difficulty getting her pants on. She was complaining of a headache. Today, daughter was called again with report that patient was having difficulty walking and had put her pants on backwards. She also fell into the wall. MRI 12/11: "3.3 x 2.3 x 3.2 cm acute parenchymal hemorrhage centered within theright frontoparietal white matter. Surrounding edema. Regional masseffect with leftward bowing of the falx but no midline shift at thelevel of the septum pellucidum. Given the hemorrhage location,consider MR venography to exclude venous thrombosis.Contrast-enhanced brain MRI is also recommended as the hematomainvolutes to exclude an underlying lesion"   Assessment / Plan / Recommendation Clinical Impression  Pt presents with mild cognitive deficits in setting of ICH.  Pt assessed using COGNISTAT (see below for additional information).  There were deficits in memory, calculation, and naming.  Per daughter 2 of the 3 items missed are not words pt knew at baseline.  Completed screen in addition and pt answered 2 of 4 items correctly.  Pt demonstrates ability to use circumlocution and did not exhibit word finding difficulties  in conversation.  Calculation impairments were mild.  Pt and daughter do not have concerns about ability to manage finances.  Daughter notes preexisting memory deficits and does express concerns about pt's ability to manage medications, but also had these concerns prior to admission.  Pt is demonstrated L neglect and requires visual cuing to attend to L side of visual field.  Pt is able to attend and answer questions/perform tasks related to L side once she has received cuing. Pt exhibited uncontrollable and inappropriate laughter during evaluation.  Daughter asked if this was normal.  Behavior appears consistent with pseudobulbar affect.  Recommended discussion with neurologist.  Pt had difficulty regaining control, but pragmatics were appropriate outside of this episode.  SLP will work with pt to address L neglect, memory, and problem solving.   COGNISTAT - all subtest are within the average range except where otherwise specified Orientation: 12/12 Attention: 7/8 Comprehension: 6/6 Repetition: 12/12 Naming: 5/8, mild impairment Construction: not assessed Memory: 4/12, severe impairment Calculations: 2/4, mild impairment Similarities: 6/8 Judgment: 4/6    SLP Assessment  SLP Recommendation/Assessment: Patient needs continued Speech Lanaguage Pathology Services SLP Visit Diagnosis: Cognitive communication deficit (R41.841)    Follow Up Recommendations  (Continue ST at next level of care)    Frequency and Duration min 2x/week  2 weeks      SLP Evaluation Cognition  Overall Cognitive Status: Impaired/Different from baseline Arousal/Alertness: Awake/alert Orientation Level: Oriented X4 Attention: Focused;Sustained Focused Attention: Appears intact Sustained Attention: Appears intact Memory: Impaired Memory Impairment: Decreased recall of new information Awareness: Impaired Awareness Impairment: Intellectual impairment Problem Solving: Appears intact Executive Function:  Reasoning Reasoning: Appears intact Behaviors: Lability  Comprehension  Auditory Comprehension Overall Auditory Comprehension: Appears within functional limits for tasks assessed Commands: Within Functional Limits Conversation: Complex    Expression Verbal Expression Overall Verbal Expression: Appears within functional limits for tasks assessed Repetition: No impairment Naming: Impairment(very mild) Responsive: 76-100% accurate Confrontation: Impaired Pragmatics: Impairment Impairments: (possible pseudobulbar affect) Written Expression Dominant Hand: Right   Oral / Motor  Oral Motor/Sensory Function Overall Oral Motor/Sensory Function: Within functional limits Facial ROM: Within Functional Limits Facial Symmetry: Within Functional Limits Lingual ROM: Within Functional Limits Lingual Symmetry: Within Functional Limits Lingual Strength: Within Functional Limits Velum: Within Functional Limits Mandible: Within Functional Limits Motor Speech Overall Motor Speech: Appears within functional limits for tasks assessed Respiration: Within functional limits Resonance: Within functional limits Articulation: Within functional limitis Intelligibility: Intelligible Motor Planning: Witnin functional limits Motor Speech Errors: Not applicable   GO                    Kerrie Pleasure, MA, CCC-SLP Acute Rehabilitation Services Office: [phone]  07/22/2019, 1:16 PM

## 2019-07-22 NOTE — Evaluation (Signed)
Physical Therapy Evaluation Patient Details Name: Stacy Flores MRN: [id number] DOB: 1956-05-09 Today's Date: 07/22/2019   History of Present Illness  63 y.o. female who presented 12/10 to the [facility] ED with confusion. CT revealed large right parietal lobe ICH.  Clinical Impression  Pt admitted with above diagnosis. PTA pt lived at home with her boyfriend. She was active and independent. On eval, she required min guard assist bed mobility, min assist transfers and min assist ambulation 150' HHA of one. She presents with L visual field cut and L side inattention. Pt currently with functional limitations due to the deficits listed below (see PT Problem List). Pt will benefit from skilled PT to increase their independence and safety with mobility to allow discharge to the venue listed below.       Follow Up Recommendations CIR    Equipment Recommendations  None recommended by PT    Recommendations for Other Services Rehab consult     Precautions / Restrictions Precautions Precautions: Fall;Other (comment) Precaution Comments: L visual field cut, L inattention      Mobility  Bed Mobility Overal bed mobility: Needs Assistance Bed Mobility: Supine to Sit     Supine to sit: HOB elevated;Min guard     General bed mobility comments: +rail, min guard for safety  Transfers Overall transfer level: Needs assistance Equipment used: None Transfers: Sit to/from Stand Sit to Stand: Min assist         General transfer comment: min assist to power up and stabilize balance  Ambulation/Gait Ambulation/Gait assistance: Min assist Gait Distance (Feet): 150 Feet Assistive device: 1 person hand held assist Gait Pattern/deviations: Step-through pattern;Decreased stride length Gait velocity: decreased Gait velocity interpretation: <1.31 ft/sec, indicative of household ambulator General Gait Details: short step length bilat, guarded gait; not attending to obstacles on L  Stairs             Wheelchair Mobility    Modified Rankin (Stroke Patients Only) Modified Rankin (Stroke Patients Only) Pre-Morbid Rankin Score: No symptoms Modified Rankin: Moderately severe disability     Balance Overall balance assessment: Needs assistance Sitting-balance support: No upper extremity supported;Feet supported Sitting balance-Leahy Scale: Good     Standing balance support: During functional activity;No upper extremity supported Standing balance-Leahy Scale: Fair                               Pertinent Vitals/Pain Pain Assessment: No/denies pain    Home Living Family/patient expects to be discharged to:: Private residence Living Arrangements: Spouse/significant other Available Help at Discharge: Family;Available 24 hours/day Type of Home: House Home Access: Stairs to enter Entrance Stairs-Rails: None Entrance Stairs-Number of Steps: 1 (threshold) Home Layout: One level Home Equipment: None      Prior Function Level of Independence: Independent         Comments: Drives, grocery shops, active     Hand Dominance   Dominant Hand: Right    Extremity/Trunk Assessment   Upper Extremity Assessment Upper Extremity Assessment: Defer to OT evaluation    Lower Extremity Assessment Lower Extremity Assessment: LLE deficits/detail(symmetrical) LLE Deficits / Details: 5/5 hip and knee, 3/5 ankle    Cervical / Trunk Assessment Cervical / Trunk Assessment: Normal  Communication   Communication: No difficulties  Cognition Arousal/Alertness: Awake/alert Behavior During Therapy: WFL for tasks assessed/performed Overall Cognitive Status: Impaired/Different from baseline Area of Impairment: Safety/judgement  Safety/Judgement: Decreased awareness of deficits;Decreased awareness of safety     General Comments: L visual field cut, L inattention      General Comments General comments (skin integrity, edema, etc.):  VSS    Exercises     Assessment/Plan    PT Assessment Patient needs continued PT services  PT Problem List Decreased mobility;Decreased strength;Decreased balance;Decreased activity tolerance;Decreased knowledge of precautions;Decreased safety awareness       PT Treatment Interventions Therapeutic activities;Gait training;Therapeutic exercise;Patient/family education;Balance training;Functional mobility training;Stair training    PT Goals (Current goals can be found in the Care Plan section)  Acute Rehab PT Goals Patient Stated Goal: independence PT Goal Formulation: With patient Time For Goal Achievement: 08/05/19 Potential to Achieve Goals: Good    Frequency Min 4X/week   Barriers to discharge        Co-evaluation PT/OT/SLP Co-Evaluation/Treatment: Yes Reason for Co-Treatment: For patient/therapist safety;To address functional/ADL transfers;Necessary to address cognition/behavior during functional activity PT goals addressed during session: Mobility/safety with mobility;Balance         AM-PAC PT "6 Clicks" Mobility  Outcome Measure Help needed turning from your back to your side while in a flat bed without using bedrails?: None Help needed moving from lying on your back to sitting on the side of a flat bed without using bedrails?: A Little Help needed moving to and from a bed to a chair (including a wheelchair)?: A Little Help needed standing up from a chair using your arms (e.g., wheelchair or bedside chair)?: A Little Help needed to walk in hospital room?: A Little Help needed climbing 3-5 steps with a railing? : A Lot 6 Click Score: 18    End of Session Equipment Utilized During Treatment: Gait belt Activity Tolerance: Patient tolerated treatment well Patient left: in chair;with chair alarm set;with call bell/phone within reach Nurse Communication: Mobility status PT Visit Diagnosis: Unsteadiness on feet (R26.81);Difficulty in walking, not elsewhere classified  (R26.2)    Time: 5974-1638 PT Time Calculation (min) (ACUTE ONLY): 35 min   Charges:   PT Evaluation $PT Eval Moderate Complexity: 1 Mod          Lorrin Goodell, PT  Office # [phone] Pager [phone]   Lorriane Shire 07/22/2019, 11:52 AM

## 2019-07-22 NOTE — Progress Notes (Signed)
Rehab Admissions Coordinator Note:  Patient was screened by Cleatrice Burke for appropriateness for an Inpatient Acute Rehab Consult per PT recs. I will see how she progresses over the weekend.  Cleatrice Burke RN MSN 07/22/2019, 12:50 PM  I can be reached at [phone].

## 2019-07-22 NOTE — Progress Notes (Signed)
STROKE TEAM PROGRESS NOTE   HISTORY OF PRESENT ILLNESS (per record) Stacy Flores is an 63 y.o. female who presented this AM to the [facility] ED with confusion. LKN was on Wednesday. Later that day the patient's daughter was contacted by patient's significant other who stated she was having difficulty getting her pants on. She was complaining of a headache. Today, daughter was called again with report that patient was having difficulty walking and had put her pants on backwards. She also fell into the wall. She was taken to the ED, where she reported a right frontal headache with onset last night suddenly. A CT head was obtained, revealing a large right parietal lobe ICH.     INTERVAL HISTORY I have reviewed history of presenting illness, electronic medical records and imaging films in PACS.  Patient has remained stable overnight and blood pressure has been adequately controlled.  No new changes. Follow-up imaging is pending   OBJECTIVE Vitals:   07/21/19 2300 07/21/19 2330 07/22/19 0000 07/22/19 0400  BP: 130/79 133/71 132/74   Pulse: 86 83 92   Resp: 14 19 13    Temp:   98.6 F (37 C) 98.1 F (36.7 C)  TempSrc:   Oral Oral  SpO2: 99% 99% 98%   Weight:      Height:        CBC:  Recent Labs  Lab 07/21/19 1148 07/21/19 2035  WBC 7.5 7.3  NEUTROABS 5.0  --   HGB 14.3 12.8  HCT 44.7 39.7  MCV 88.9 87.6  PLT 256 248    Basic Metabolic Panel:  Recent Labs  Lab 07/21/19 1148  NA 134*  K 3.9  CL 100  CO2 23  GLUCOSE 172*  BUN 11  CREATININE 0.91  CALCIUM 9.3    Lipid Panel: No results found for: CHOL, TRIG, HDL, CHOLHDL, VLDL, LDLCALC HgbA1c: No results found for: HGBA1C Urine Drug Screen: No results found for: LABOPIA, COCAINSCRNUR, LABBENZ, AMPHETMU, THCU, LABBARB  Alcohol Level No results found for: ETH  IMAGING  CT HEAD WO CONTRAST 07/21/2019 IMPRESSION:  3.3 x 2.3 x 3.2 cm acute parenchymal hemorrhage centered within the right frontoparietal white matter.  Surrounding edema. Regional mass effect with leftward bowing of the falx but no midline shift at the level of the septum pellucidum. Given the hemorrhage location, consider MR venography to exclude venous thrombosis. Contrast-enhanced brain MRI is also recommended as the hematoma involutes to exclude an underlying lesion A subtle focus of cortical/subcortical edema is questioned within the right occipital lobe. This is nonspecific, but may reflect a small infarct. This too could be further evaluated with brain MRI.   MRI head W&Wo - pending  MRA head - pending  DG Chest Port 1 View 07/21/2019 IMPRESSION:  No acute abnormalities.    Bilateral Carotid Dopplers - pending   ECG - SR rate 73 BPM. (See cardiology reading for complete details)   PHYSICAL EXAM Blood pressure 132/74, pulse 92, temperature 98.1 F (36.7 C), temperature source Oral, resp. rate 13, height 5\' 2"  (1.575 m), weight 72.6 kg, SpO2 98 %. Pleasant middle-aged lady not in distress. . Afebrile. Head is nontraumatic. Neck is supple without bruit.    Cardiac exam no murmur or gallop. Lungs are clear to auscultation. Distal pulses are well felt. Neurological Exam :  Awake alert follows simple commands speech is slightly hesitant but able to speak clearly.  Left gaze preference but able to look to the right of the midline only.  Left homonymous hemianopsia.  Mild left lower facial weakness.  Tongue midline.  Motor system exam no upper or lower extremity drift but mild weakness of left grip and intrinsic hand muscles orbits right over left upper extremity.  Mild weakness of left ankle dorsiflexors and hip flexors only.  Sensation appears slightly reduced on the left compared to the right.  Deep tendon reflexes are symmetric.  Plantars downgoing.  Gait not tested.     ASSESSMENT/PLAN [name] is a 63 y.o. female with history of hypertension presenting with headache, confusion, difficulty walking, difficulty dressing  herself, and recent fall. She did not receive IV t-PA due to [place].  Stroke:  3.3 x 2.3 x 3.2 cm acute parenchymal hemorrhage centered within the right frontoparietal white matter.  Resultant left-sided visual field defect and mild weakness   code Stroke CT Head - not ordered  CT head - 3.3 x 2.3 x 3.2 cm acute parenchymal hemorrhage centered within the right frontoparietal white matter. Surrounding edema. Regional mass effect with leftward bowing of the falx but no midline shift at the level of the septum pellucidum. Given the hemorrhage location, consider MR venography to exclude venous thrombosis. Contrast-enhanced brain MRI is also recommended as the hematoma involutes to exclude an underlying lesion A subtle focus of cortical/subcortical edema is questioned within the right occipital lobe. This is nonspecific, but may reflect a small infarct.   MRI head W&Wo - pending  MRA head - pending  CTA H&N - not ordered  CT Perfusion - not ordered  Carotid Doppler - pending  2D Echo - not ordered  [employer] Virus 2  - negative  LDL - not ordered  HgbA1c - pending  UDS - not ordered  VTE prophylaxis - SCDs Diet  Diet Order            Diet regular Room service appropriate? Yes; Fluid consistency: Thin  Diet effective now               No antithrombotic prior to admission, now on No antithrombotic  Ongoing aggressive stroke risk factor management  Therapy recommendations:  pending  Disposition:  Pending  Hypertension  Home BP meds: amlodipine  Current BP meds: amlodipine (cleviprex, hydralazine and Labetalol prn)  Stable . SBP goal < 140 mm Hg initially . Long-term BP goal normotensive  Hyperlipidemia  Home Lipid lowering medication: none   LDL - not ordered, goal < 70  Current lipid lowering medication: none - contraindicated in ICH  Continue statin at discharge   Other Stroke Risk Factors  Advanced age  ETOH use, advised to drink no more than 1  alcoholic beverage per day.  Obesity, Body mass index is 29.26 kg/m., recommend weight loss, diet and exercise as appropriate   Possible Hx stroke/TIA by imaging  Family hx stroke - not on file  Other Active Problems  Full Code  Hyponatremia - 134  Hyperglycemia - 172 -> check hemaglobin A1C  Hospital day # 1  I have personally obtained history,examined this patient, reviewed notes, independently viewed imaging studies, participated in medical decision making and plan of care.ROS completed by me personally and pertinent positives fully documented  I have made any additions or clarifications directly to the above note.  She presented with a large right parietal intracerebral hemorrhage etiology possibly due to hypertensive small vessel disease versus amyloid angiopathy.  Continue strict control of blood pressure with systolic goal below 144 for 24 hours and then below 160.  Close neurological monitoring.  Check MRI  scan of the brain later today.  Maintain euvolemia, euglycemia and normothermia.  DVT and GI prophylaxis. This patient is critically ill and at significant risk of neurological worsening, death and care requires constant monitoring of vital signs, hemodynamics,respiratory and cardiac monitoring, extensive review of multiple databases, frequent neurological assessment, discussion with family, other specialists and medical decision making of high complexity.I have made any additions or clarifications directly to the above note.This critical care time does not reflect procedure time, or teaching time or supervisory time of PA/NP/Med Resident etc but could involve care discussion time.  I spent 30 minutes of neurocritical care time  in the care of  this patient.      Delia HeadyPramod Myalynn Lingle, MD Medical Director [facility] Pager: [phone] 07/22/2019 3:34 PM   To contact Stroke Continuity provider, please refer to [email or web address]. After hours, contact General Neurology

## 2019-07-22 NOTE — Evaluation (Signed)
Clinical/Bedside Swallow Evaluation Patient Details  Name: Stacy Flores MRN: [id number] Date of Birth: 16-Dec-1955  Today's Date: 07/22/2019 Time: SLP Start Time (ACUTE ONLY): 4270 SLP Stop Time (ACUTE ONLY): 1142 SLP Time Calculation (min) (ACUTE ONLY): 6 min  Past Medical History:  Past Medical History:  Diagnosis Date  . Hypertension    Past Surgical History:  Past Surgical History:  Procedure Laterality Date  . ABDOMINAL HYSTERECTOMY    . TONSILLECTOMY     HPI:  Stacy Flores is an 63 y.o. female who presented this AM to the [facility] ED with confusion. LKN was on Wednesday. Later that day the patient's daughter was contacted by patient's significant other who stated she was having difficulty getting her pants on. She was complaining of a headache. Today, daughter was called again with report that patient was having difficulty walking and had put her pants on backwards. She also fell into the wall. MRI 12/11: "3.3 x 2.3 x 3.2 cm acute parenchymal hemorrhage centered within theright frontoparietal white matter. Surrounding edema. Regional masseffect with leftward bowing of the falx but no midline shift at thelevel of the septum pellucidum. Given the hemorrhage location,consider MR venography to exclude venous thrombosis.Contrast-enhanced brain MRI is also recommended as the hematomainvolutes to exclude an underlying lesion"   Assessment / Plan / Recommendation Clinical Impression  Pt present with functional swallowing as assessed clinically.  Pt tolerated all consistencies trialed with no clinical s/s of aspiration and exhibited good oral clearance of solids.  Pt has no ST needs for swallowing at this time. SLP Visit Diagnosis: Dysphagia, unspecified (R13.10)    Aspiration Risk  No limitations    Diet Recommendation Regular;Thin liquid   Liquid Administration via: Cup;Straw Medication Administration: Whole meds with liquid Supervision: Patient able to self feed Compensations: Slow  rate;Small sips/bites Postural Changes: Seated upright at 90 degrees    Other  Recommendations Oral Care Recommendations: Oral care BID   Follow up Recommendations None      Frequency and Duration  N/A          Prognosis   N/A     Swallow Study   General HPI: Stacy Flores is an 63 y.o. female who presented this AM to the [facility] ED with confusion. LKN was on Wednesday. Later that day the patient's daughter was contacted by patient's significant other who stated she was having difficulty getting her pants on. She was complaining of a headache. Today, daughter was called again with report that patient was having difficulty walking and had put her pants on backwards. She also fell into the wall. MRI 12/11: "3.3 x 2.3 x 3.2 cm acute parenchymal hemorrhage centered within theright frontoparietal white matter. Surrounding edema. Regional masseffect with leftward bowing of the falx but no midline shift at thelevel of the septum pellucidum. Given the hemorrhage location,consider MR venography to exclude venous thrombosis.Contrast-enhanced brain MRI is also recommended as the hematomainvolutes to exclude an underlying lesion" Type of Study: Bedside Swallow Evaluation Previous Swallow Assessment: None Diet Prior to this Study: Regular;Thin liquids Temperature Spikes Noted: No Respiratory Status: Room air History of Recent Intubation: No Behavior/Cognition: Alert;Cooperative;Pleasant mood Oral Cavity Assessment: Within Functional Limits Oral Care Completed by SLP: No Oral Cavity - Dentition: Dentures, top;Adequate natural dentition Vision: Functional for self-feeding Self-Feeding Abilities: Able to feed self Patient Positioning: Upright in chair Baseline Vocal Quality: Normal Volitional Cough: Strong Volitional Swallow: Able to elicit    Oral/Motor/Sensory Function Overall Oral Motor/Sensory Function: Within functional limits Facial ROM: Within Functional Limits  Facial Symmetry: Within  Functional Limits Lingual ROM: Within Functional Limits Lingual Symmetry: Within Functional Limits Lingual Strength: Within Functional Limits Velum: Within Functional Limits Mandible: Within Functional Limits   Ice Chips Ice chips: Not tested   Thin Liquid Thin Liquid: Within functional limits Presentation: Cup;Straw    Nectar Thick Nectar Thick Liquid: Not tested   Honey Thick Honey Thick Liquid: Not tested   Puree Puree: Within functional limits Presentation: Spoon   Solid     Solid: Within functional limits Presentation: Self Fed      Kerrie Pleasure, MA, CCC-SLP Acute Rehabilitation Services Office: [phone] 07/22/2019,1:06 PM

## 2019-07-22 NOTE — Progress Notes (Signed)
PT Cancellation Note  Patient Details Name: Stacy Flores MRN: [id number] DOB: September 21, 1955   Cancelled Treatment:    Reason Eval/Treat Not Completed: Active bedrest order. Please update activity order when appropriate for PT to proceed with eval.    Lorriane Shire 07/22/2019, 8:04 AM   Lorrin Goodell, PT  Office # [phone] Pager [phone]

## 2019-07-23 ENCOUNTER — Inpatient Hospital Stay (HOSPITAL_COMMUNITY): Payer: Commercial Managed Care - PPO

## 2019-07-23 DIAGNOSIS — I10 Essential (primary) hypertension: Secondary | ICD-10-CM

## 2019-07-23 DIAGNOSIS — I6389 Other cerebral infarction: Secondary | ICD-10-CM

## 2019-07-23 DIAGNOSIS — I680 Cerebral amyloid angiopathy: Secondary | ICD-10-CM

## 2019-07-23 DIAGNOSIS — I611 Nontraumatic intracerebral hemorrhage in hemisphere, cortical: Secondary | ICD-10-CM

## 2019-07-23 LAB — LIPID PANEL
Cholesterol: 172 mg/dL (ref 0–200)
HDL: 58 mg/dL (ref >40)
LDL Cholesterol: 93 mg/dL (ref 0–99)
Total CHOL/HDL Ratio: 3 RATIO
Triglycerides: 106 mg/dL (ref <150)
VLDL: 21 mg/dL (ref 0–40)

## 2019-07-23 LAB — ECHOCARDIOGRAM COMPLETE
Height: 62 in
Weight: 2560 oz

## 2019-07-23 LAB — RAPID URINE DRUG SCREEN, HOSP PERFORMED
Amphetamines: NOT DETECTED
Barbiturates: NOT DETECTED
Benzodiazepines: NOT DETECTED
Cocaine: NOT DETECTED
Opiates: NOT DETECTED
Tetrahydrocannabinol: NOT DETECTED

## 2019-07-23 LAB — CBC
HCT: 38.5 % (ref 36.0–46.0)
Hemoglobin: 12.7 g/dL (ref 12.0–15.0)
MCH: 28.6 pg (ref 26.0–34.0)
MCHC: 33 g/dL (ref 30.0–36.0)
MCV: 86.7 fL (ref 80.0–100.0)
Platelets: 231 10*3/uL (ref 150–400)
RBC: 4.44 MIL/uL (ref 3.87–5.11)
RDW: 13.9 % (ref 11.5–15.5)
WBC: 5.6 10*3/uL (ref 4.0–10.5)
nRBC: 0 % (ref 0.0–0.2)

## 2019-07-23 LAB — BASIC METABOLIC PANEL
Anion gap: 9 (ref 5–15)
BUN: 9 mg/dL (ref 6–20)
CO2: 27 mmol/L (ref 22–32)
Calcium: 9 mg/dL (ref 8.9–10.3)
Chloride: 105 mmol/L (ref 98–111)
Creatinine, Ser: 0.92 mg/dL (ref 0.44–1.00)
GFR calc Af Amer: >60 mL/min (ref >60)
GFR calc non Af Amer: >60 mL/min (ref >60)
Glucose, Bld: 108 mg/dL — ABNORMAL HIGH (ref 70–99)
Potassium: 3.6 mmol/L (ref 3.5–5.1)
Sodium: 141 mmol/L (ref 135–145)

## 2019-07-23 LAB — HEMOGLOBIN A1C
Hgb A1c MFr Bld: 5.4 % (ref 4.8–5.6)
Mean Plasma Glucose: 108.28 mg/dL

## 2019-07-23 MED ORDER — LABETALOL HCL 5 MG/ML IV SOLN: 5.0000 mg | INTRAVENOUS | Status: DC | PRN

## 2019-07-23 NOTE — Progress Notes (Signed)
VASCULAR LAB PRELIMINARY  PRELIMINARY  PRELIMINARY  PRELIMINARY  Carotid duplex completed.    Preliminary report:  See CV proc for preliminary results.   Riccardo Holeman, RVT 07/23/2019, 12:59 PM

## 2019-07-23 NOTE — Progress Notes (Signed)
Physical Therapy Treatment Patient Details Name: Stacy Flores MRN: [id number] DOB: 03-Sep-1955 Today's Date: 07/23/2019    History of Present Illness 63 y.o. female who presented 12/10 to the [facility] ED with confusion. CT revealed large right parietal lobe ICH.    PT Comments    Patient progressing well towards PT goals. Feels strength has improved however continues to demonstrate left inattention and left visual field deficits putting pt at increased risk for falls. Pt has difficulty navigating environment and bumps into things on left side consistently. Also lacks awareness of these deficits. Discussed disposition options with pt and daughter present in room. If pt is denied CIR, recommend home with HHPT. Pt has family support regardless. Would really benefit from follow up services to address compensatory strategies for the inattention, awareness and overall safety. Will follow.    Follow Up Recommendations  CIR     Equipment Recommendations  None recommended by PT    Recommendations for Other Services       Precautions / Restrictions Precautions Precautions: Fall;Other (comment) Precaution Comments: Lft inattention, left visual deficits Restrictions Weight Bearing Restrictions: No    Mobility  Bed Mobility               General bed mobility comments: up in chair upon PT arrival.  Transfers Overall transfer level: Needs assistance Equipment used: None Transfers: Sit to/from Stand Sit to Stand: Min guard         General transfer comment: Min guard for safety. Stood from chair without difficulty using both UEs.  Ambulation/Gait Ambulation/Gait assistance: Min guard Gait Distance (Feet): 200 Feet Assistive device: None Gait Pattern/deviations: Step-through pattern;Decreased stride length;Drifts right/left Gait velocity: decreased Gait velocity interpretation: <1.31 ft/sec, indicative of household ambulator General Gait Details: very slow, mildly unsteady gait  bumping into things on left side or even in front (on left) without awareness, dizziness with head turns. Poor attention to left environment. Did path finding tasks to practice attending to left side/environment, able to find room numbers, items with increased time/cues.   Stairs Stairs: Yes Stairs assistance: Min guard Stair Management: Step to pattern;One rail Right Number of Stairs: 2 General stair comments: Cues for technique/safety.   Wheelchair Mobility    Modified Rankin (Stroke Patients Only) Modified Rankin (Stroke Patients Only) Pre-Morbid Rankin Score: No symptoms Modified Rankin: Moderately severe disability     Balance Overall balance assessment: Needs assistance Sitting-balance support: No upper extremity supported;Feet supported Sitting balance-Leahy Scale: Good     Standing balance support: During functional activity Standing balance-Leahy Scale: Fair Standing balance comment: can maintain static standing, does bump into L sided objects                            Cognition Arousal/Alertness: Awake/alert Behavior During Therapy: WFL for tasks assessed/performed Overall Cognitive Status: Impaired/Different from baseline Area of Impairment: Safety/judgement;Awareness                         Safety/Judgement: Decreased awareness of deficits;Decreased awareness of safety Awareness: Emergent   General Comments: laughing inappropriately at times with regards to running into things on left side; poor awareness of safety/deficits.      Exercises      General Comments General comments (skin integrity, edema, etc.): VSS. Daughter present during session.      Pertinent Vitals/Pain Pain Assessment: No/denies pain    Home Living  Prior Function            PT Goals (current goals can now be found in the care plan section) Progress towards PT goals: Progressing toward goals    Frequency    Min  4X/week      PT Plan Current plan remains appropriate    Co-evaluation              AM-PAC PT "6 Clicks" Mobility   Outcome Measure  Help needed turning from your back to your side while in a flat bed without using bedrails?: None Help needed moving from lying on your back to sitting on the side of a flat bed without using bedrails?: None Help needed moving to and from a bed to a chair (including a wheelchair)?: A Little Help needed standing up from a chair using your arms (e.g., wheelchair or bedside chair)?: A Little Help needed to walk in hospital room?: A Little Help needed climbing 3-5 steps with a railing? : A Little 6 Click Score: 20    End of Session Equipment Utilized During Treatment: Gait belt Activity Tolerance: Patient tolerated treatment well Patient left: in chair;with chair alarm set;with family/visitor present Nurse Communication: Mobility status PT Visit Diagnosis: Unsteadiness on feet (R26.81);Difficulty in walking, not elsewhere classified (R26.2)     Time: 5573-2202 PT Time Calculation (min) (ACUTE ONLY): 23 min  Charges:  $Gait Training: 8-22 mins $Neuromuscular Re-education: 8-22 mins                     Stacy Flores, PT, DPT Acute Rehabilitation Services Pager [phone] Office [phone]       Stacy Flores 07/23/2019, 3:34 PM

## 2019-07-23 NOTE — Progress Notes (Signed)
STROKE TEAM PROGRESS NOTE   INTERVAL HISTORY Pt sitting in bed, eating breakfast. Neuro intact. BP stable off cleviprex. On diet. MRI showed stable hematoma and etiology likely CAA.    OBJECTIVE Vitals:   07/23/19 0600 07/23/19 0604 07/23/19 0635 07/23/19 0700  BP:   136/71 116/61  Pulse: 67 95 81 65  Resp: 13 13 17 13   Temp:      TempSrc:      SpO2: 98% 100% 97% 96%  Weight:      Height:        CBC:  Recent Labs  Lab 07/21/19 1148 07/21/19 2035 07/23/19 0555  WBC 7.5 7.3 5.6  NEUTROABS 5.0  --   --   HGB 14.3 12.8 12.7  HCT 44.7 39.7 38.5  MCV 88.9 87.6 86.7  PLT 256 248 231    Basic Metabolic Panel:  Recent Labs  Lab 07/21/19 1148 07/23/19 0555  NA 134* 141  K 3.9 3.6  CL 100 105  CO2 23 27  GLUCOSE 172* 108*  BUN 11 9  CREATININE 0.91 0.92  CALCIUM 9.3 9.0    Lipid Panel:     Component Value Date/Time   CHOL 172 07/23/2019 0555   TRIG 106 07/23/2019 0555   HDL 58 07/23/2019 0555   CHOLHDL 3.0 07/23/2019 0555   VLDL 21 07/23/2019 0555   LDLCALC 93 07/23/2019 0555   HgbA1c:  Lab Results  Component Value Date   HGBA1C 5.4 07/23/2019   Urine Drug Screen: No results found for: LABOPIA, COCAINSCRNUR, LABBENZ, AMPHETMU, THCU, LABBARB  Alcohol Level No results found for: ETH  IMAGING  CT HEAD WO CONTRAST 07/21/2019 IMPRESSION:  3.3 x 2.3 x 3.2 cm acute parenchymal hemorrhage centered within the right frontoparietal white matter. Surrounding edema. Regional mass effect with leftward bowing of the falx but no midline shift at the level of the septum pellucidum. Given the hemorrhage location, consider MR venography to exclude venous thrombosis. Contrast-enhanced brain MRI is also recommended as the hematoma involutes to exclude an underlying lesion A subtle focus of cortical/subcortical edema is questioned within the right occipital lobe. This is nonspecific, but may reflect a small infarct. This too could be further evaluated with brain MRI.   MRI  Head WO Contrast (Dr Pearlean BrownieSethi ordered W&WO) MRA Head  07/22/2019 IMPRESSION: MRI head: 1. Motion degraded examination. 2. Unchanged size of a parenchymal hemorrhage centered within the right parietal white matter. Similar mass effect with partial effacement of the posterior right lateral ventricle and leftward bowing of the falx without midline shift at the level of the septum pellucidum. There are numerous small foci of supratentorial susceptibility weighted signal loss, greatest within the right parietal, occipital and temporal lobes. 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 3. No appreciable abnormal enhancement at the hematoma site on the current study. However, a repeat contrast-enhanced brain MRI is recommended as the hematoma involutes to assess for an underlying lesion. 4. Moderate chronic small vessel ischemic disease. MRA head: 1. Significantly motion degraded, limited examination. 2. No proximal large vessel occlusion. 3. No large intracranial aneurysm is identified. No evidence of AVM.   DG Chest Port 1 View 07/21/2019 IMPRESSION:  No acute abnormalities.   Transthoracic Echocardiogram - pending   Bilateral Carotid Dopplers - pending   ECG - SR rate 73 BPM. (See cardiology reading for complete details)   PHYSICAL EXAM  Temp:  [98.1 F (36.7 C)-99 F (37.2 C)] 98.1 F (36.7 C) (12/12 0800) Pulse Rate:  [63-95] 69 (  12/12 0900) Resp:  [12-21] 15 (12/12 0900) BP: (104-158)/(56-93) 137/82 (12/12 0900) SpO2:  [96 %-100 %] 99 % (12/12 0900)  General - Well nourished, well developed, in no apparent distress.  Ophthalmologic - fundi not visualized due to noncooperation.  Cardiovascular - Regular rhythm and rate.  Mental Status -  Level of arousal and orientation to time, place, and person were intact. Language including expression, naming, repetition, comprehension was assessed and found intact.  Cranial Nerves II - XII - II - Visual field intact  OU. Left hemianopia much improved III, IV, VI - Extraocular movements intact. 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 VII - Facial movement intact bilaterally. VIII - Hearing & vestibular intact bilaterally. X - Palate elevates symmetrically. XI - Chin turning & shoulder shrug intact bilaterally. XII - Tongue protrusion intact.  Motor Strength - The patient's strength was normal in all extremities and pronator drift was absent.  Bulk was normal and fasciculations were absent.   Motor Tone - Muscle tone was assessed at the neck and appendages and was normal.  Reflexes - The patient's reflexes were symmetrical in all extremities and she had no pathological reflexes.  Sensory - Light touch, temperature/pinprick were assessed and were symmetrical.    Coordination - The patient had normal movements in the hands with no ataxia or dysmetria.  Tremor was absent.  Gait and Station - deferred.   ASSESSMENT/PLAN Stacy Flores is a 63 y.o. female with history of hypertension presenting with headache, confusion, difficulty walking, difficulty dressing herself, and recent fall. She did not receive IV t-PA due to [place].  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   MRI head - Unchanged size of a parenchymal hemorrhage centered within the right parietal white matter. There are numerous small foci of supratentorial susceptibility weighted signal loss, greatest within the right parietal, occipital and temporal lobes. 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   MRA head - No proximal large vessel occlusion. No large intracranial aneurysm is identified. No evidence of AVM.  Carotid Doppler - pending  2D Echo - pending  Sars Corona Virus 2 -  negative  LDL - 93  HgbA1c - 5.4  UDS - pending  VTE prophylaxis - SCDs  No antithrombotic prior to admission, now on No antithrombotic. Avoid antiplatelet in the future due to probable CAA  Ongoing aggressive stroke risk factor management  Therapy recommendations:  CIR  Disposition:  Pending  Possible CAA  MRI showed right temporal parietal and occipital numerous CMBs, possible CAA  Strict BP control  Longer term BP goal < 140  Avoid antiplatelet  OK with tylenol for pain  Hypertension  Home BP meds: amlodipine  Current BP meds: amlodipine   Off cleviprex  Labetalol PRN  Stable . SBP goal < 160 mm now . Long-term BP goal < 140  Hyperlipidemia  Home Lipid lowering medication: none   LDL - 93, goal < 70  Current lipid lowering medication: none given acute ICH  Other Stroke Risk Factors  Advanced age  ETOH use, advised to drink no more than 1 alcoholic beverage per day.  Obesity, Body mass index is 29.26 kg/m., recommend weight loss, diet and exercise as appropriate   Other Active Problems  Hyponatremia, resolved - 134->141  Hospital day # 2  This patient is critically ill and at significant risk of neurological worsening, death and care requires constant monitoring of vital signs, hemodynamics,respiratory and cardiac monitoring, extensive review of multiple databases, frequent neurological assessment, discussion with family, other specialists and medical decision making of high complexity. I spent 35 minutes of neurocritical care time  in the care of  this patient.   Marvel Plan, MD PhD Stroke Neurology 07/23/2019 9:51 AM   To contact Stroke Continuity provider, please refer to [email or web address]. After hours, contact General Neurology

## 2019-07-23 NOTE — Progress Notes (Signed)
*  PRELIMINARY RESULTS* Echocardiogram 2D Echocardiogram has been performed.  Leavy Cella 07/23/2019, 12:21 PM

## 2019-07-24 DIAGNOSIS — E7800 Pure hypercholesterolemia, unspecified: Secondary | ICD-10-CM

## 2019-07-24 LAB — BASIC METABOLIC PANEL
Anion gap: 10 (ref 5–15)
BUN: 9 mg/dL (ref 6–20)
CO2: 27 mmol/L (ref 22–32)
Calcium: 9.1 mg/dL (ref 8.9–10.3)
Chloride: 104 mmol/L (ref 98–111)
Creatinine, Ser: 0.74 mg/dL (ref 0.44–1.00)
GFR calc Af Amer: >60 mL/min (ref >60)
GFR calc non Af Amer: >60 mL/min (ref >60)
Glucose, Bld: 101 mg/dL — ABNORMAL HIGH (ref 70–99)
Potassium: 3.6 mmol/L (ref 3.5–5.1)
Sodium: 141 mmol/L (ref 135–145)

## 2019-07-24 LAB — CBC
HCT: 38.8 % (ref 36.0–46.0)
Hemoglobin: 12.6 g/dL (ref 12.0–15.0)
MCH: 28.8 pg (ref 26.0–34.0)
MCHC: 32.5 g/dL (ref 30.0–36.0)
MCV: 88.6 fL (ref 80.0–100.0)
Platelets: 245 10*3/uL (ref 150–400)
RBC: 4.38 MIL/uL (ref 3.87–5.11)
RDW: 13.9 % (ref 11.5–15.5)
WBC: 6 10*3/uL (ref 4.0–10.5)
nRBC: 0 % (ref 0.0–0.2)

## 2019-07-24 NOTE — Progress Notes (Signed)
STROKE TEAM PROGRESS NOTE   INTERVAL HISTORY Pt sitting in chair, on the phone with daughter. Her left hemianopia much improved, essentially resolved. Discussed with pt at bedside and daughter on the phone, currently PT recommend CIR. Will see how she does tomorrow to see whether she can d/c home or needs CIR.    OBJECTIVE Vitals:   07/23/19 2352 07/24/19 0000 07/24/19 0359 07/24/19 0400  BP:  137/66  (!) 153/91  Pulse:      Resp:  20  14  Temp: 98.6 F (37 C)  (!) 97.3 F (36.3 C)   TempSrc: Oral  Oral   SpO2:  97%    Weight:      Height:        CBC:  Recent Labs  Lab 07/21/19 1148 07/23/19 0555 07/24/19 0514  WBC 7.5 5.6 6.0  NEUTROABS 5.0  --   --   HGB 14.3 12.7 12.6  HCT 44.7 38.5 38.8  MCV 88.9 86.7 88.6  PLT 256 231 245    Basic Metabolic Panel:  Recent Labs  Lab 07/23/19 0555 07/24/19 0514  NA 141 141  K 3.6 3.6  CL 105 104  CO2 27 27  GLUCOSE 108* 101*  BUN 9 9  CREATININE 0.92 0.74  CALCIUM 9.0 9.1    Lipid Panel:     Component Value Date/Time   CHOL 172 07/23/2019 0555   TRIG 106 07/23/2019 0555   HDL 58 07/23/2019 0555   CHOLHDL 3.0 07/23/2019 0555   VLDL 21 07/23/2019 0555   LDLCALC 93 07/23/2019 0555   HgbA1c:  Lab Results  Component Value Date   HGBA1C 5.4 07/23/2019   Urine Drug Screen:     Component Value Date/Time   LABOPIA NONE DETECTED 07/23/2019 1928   COCAINSCRNUR NONE DETECTED 07/23/2019 1928   LABBENZ NONE DETECTED 07/23/2019 1928   AMPHETMU NONE DETECTED 07/23/2019 1928   THCU NONE DETECTED 07/23/2019 1928   LABBARB NONE DETECTED 07/23/2019 1928    Alcohol Level No results found for: ETH  IMAGING  CT HEAD WO CONTRAST 07/21/2019 IMPRESSION:  3.3 x 2.3 x 3.2 cm acute parenchymal hemorrhage centered within the right frontoparietal white matter. Surrounding edema. Regional mass effect with leftward bowing of the falx but no midline shift at the level of the septum pellucidum. Given the hemorrhage location, consider  MR venography to exclude venous thrombosis. Contrast-enhanced brain MRI is also recommended as the hematoma involutes to exclude an underlying lesion A subtle focus of cortical/subcortical edema is questioned within the right occipital lobe. This is nonspecific, but may reflect a small infarct. This too could be further evaluated with brain MRI.   MRI Head WO Contrast (Dr Pearlean BrownieSethi ordered W&WO) MRA Head  07/22/2019 IMPRESSION: MRI head: 1. Motion degraded examination. 2. Unchanged size of a parenchymal hemorrhage centered within the right parietal white matter. Similar mass effect with partial effacement of the posterior right lateral ventricle and leftward bowing of the falx without midline shift at the level of the septum pellucidum. There are numerous small foci of supratentorial susceptibility weighted signal loss, greatest within the right parietal, occipital and temporal lobes. 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 3. No appreciable abnormal enhancement at the hematoma site on the current study. However, a repeat contrast-enhanced brain MRI is recommended as the hematoma involutes to assess for an underlying lesion. 4. Moderate chronic small vessel ischemic disease. MRA head: 1. Significantly motion degraded, limited examination. 2. No proximal large vessel occlusion. 3. No large  intracranial aneurysm is identified. No evidence of AVM.   DG Chest Port 1 View 07/21/2019 IMPRESSION:  No acute abnormalities.   Transthoracic Echocardiogram  07/23/19 IMPRESSIONS  1. Left ventricular ejection fraction, by visual estimation, is 60 to 65%. The left ventricle has normal function. There is no left ventricular hypertrophy.  2. Left ventricular diastolic parameters are consistent with Grade I diastolic dysfunction (impaired relaxation).  3. The left ventricle has no regional wall motion abnormalities.  4. Global right ventricle has normal systolic function.The right  ventricular size is normal. No increase in right ventricular wall thickness.  5. Left atrial size was normal.  6. Right atrial size was normal.  7. The mitral valve is normal in structure. No evidence of mitral valve regurgitation. No evidence of mitral stenosis.  8. The tricuspid valve is normal in structure. Tricuspid valve regurgitation is not demonstrated.  9. The aortic valve is normal in structure. Aortic valve regurgitation is not visualized. No evidence of aortic valve sclerosis or stenosis. 10. The pulmonic valve was not well visualized. Pulmonic valve regurgitation is not visualized.  Bilateral Carotid Dopplers - preliminary 07/23/19 Summary: Right Carotid: The extracranial vessels were near-normal with only minimal wall thickening or plaque. Left Carotid: The extracranial vessels were near-normal with only minimal wall thickening or plaque. Vertebrals:  Bilateral vertebral arteries demonstrate antegrade flow. Subclavians: Normal flow hemodynamics were seen in bilateral subclavian arteries.   ECG - SR rate 73 BPM. (See cardiology reading for complete details)   PHYSICAL EXAM  Temp:  [97.3 F (36.3 C)-98.6 F (37 C)] 97.3 F (36.3 C) (12/13 0359) Pulse Rate:  [66-78] 78 (12/12 2000) Resp:  [13-24] 14 (12/13 0400) BP: (129-157)/(66-91) 153/91 (12/13 0400) SpO2:  [97 %-99 %] 97 % (12/13 0000)  General - Well nourished, well developed, in no apparent distress.  Ophthalmologic - fundi not visualized due to noncooperation.  Cardiovascular - Regular rhythm and rate.  Mental Status -  Level of arousal and orientation to time, place, and person were intact. Language including expression, naming, repetition, comprehension was assessed and found intact.  Cranial Nerves II - XII - II - Visual field intact OU III, IV, VI - Extraocular movements intact. 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 VII - Facial movement intact bilaterally. VIII -  Hearing & vestibular intact bilaterally. X - Palate elevates symmetrically. XI - Chin turning & shoulder shrug intact bilaterally. XII - Tongue protrusion intact.  Motor Strength - The patient's strength was normal in all extremities and pronator drift was absent.  Bulk was normal and fasciculations were absent.   Motor Tone - Muscle tone was assessed at the neck and appendages and was normal.  Reflexes - The patient's reflexes were symmetrical in all extremities and she had no pathological reflexes.  Sensory - Light touch, temperature/pinprick were assessed and were symmetrical.    Coordination - The patient had normal movements in the hands with no ataxia or dysmetria.  Tremor was absent.  Gait and Station - deferred.   ASSESSMENT/PLAN [name] is a 63 y.o. female with history of hypertension presenting with headache, confusion, difficulty walking, difficulty dressing herself, and recent fall. She did not receive IV t-PA due to ICH.  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   MRI head - Unchanged size of a parenchymal hemorrhage centered within the right parietal white matter. There are numerous small foci of supratentorial susceptibility weighted signal loss, greatest within the right parietal, occipital and temporal lobes. 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   MRA head - No proximal large vessel occlusion. No large intracranial aneurysm is identified. No evidence of AVM.  Carotid Doppler unremarkable  2D Echo - EF 60 - 65%. No cardiac source of emboli identified.   Sars Corona Virus 2 - negative  LDL - 93  HgbA1c - 5.4  UDS - negative  VTE prophylaxis - SCDs  No antithrombotic prior to admission, now on No antithrombotic. Avoid  antiplatelet in the future due to probable CAA  Ongoing aggressive stroke risk factor management  Therapy recommendations:  CIR  Disposition:  Pending  Possible CAA  MRI showed right temporal parietal and occipital numerous CMBs, possible CAA  Strict BP control  Longer term BP goal < 140  Avoid antiplatelet  OK with tylenol for pain  Hypertension  Home BP meds: amlodipine  Current BP meds: amlodipine   Off cleviprex  Labetalol PRN  Stable . SBP goal < 160 mm now . Long-term BP goal < 140  Hyperlipidemia  Home Lipid lowering medication: none   LDL - 93, goal < 70  Current lipid lowering medication: none given acute ICH and possible CAA  Other Stroke Risk Factors  Advanced age  ETOH use, advised to drink no more than 1 alcoholic beverage per day.  Obesity, Body mass index is 29.26 kg/m., recommend weight loss, diet and exercise as appropriate   Other Active Problems  Hyponatremia, resolved - 134->141->141  Hospital day # 3  Rosalin Hawking, MD PhD Stroke Neurology 07/24/2019 10:42 AM   To contact Stroke Continuity provider, please refer to [email or web address] After hours, contact General Neurology

## 2019-07-25 DIAGNOSIS — I680 Cerebral amyloid angiopathy: Secondary | ICD-10-CM | POA: Diagnosis present

## 2019-07-25 DIAGNOSIS — I10 Essential (primary) hypertension: Secondary | ICD-10-CM | POA: Diagnosis present

## 2019-07-25 DIAGNOSIS — E785 Hyperlipidemia, unspecified: Secondary | ICD-10-CM | POA: Diagnosis present

## 2019-07-25 LAB — BASIC METABOLIC PANEL
Anion gap: 9 (ref 5–15)
BUN: 10 mg/dL (ref 6–20)
CO2: 27 mmol/L (ref 22–32)
Calcium: 8.9 mg/dL (ref 8.9–10.3)
Chloride: 105 mmol/L (ref 98–111)
Creatinine, Ser: 0.7 mg/dL (ref 0.44–1.00)
GFR calc Af Amer: >60 mL/min (ref >60)
GFR calc non Af Amer: >60 mL/min (ref >60)
Glucose, Bld: 104 mg/dL — ABNORMAL HIGH (ref 70–99)
Potassium: 3.6 mmol/L (ref 3.5–5.1)
Sodium: 141 mmol/L (ref 135–145)

## 2019-07-25 LAB — CBC
HCT: 37 % (ref 36.0–46.0)
Hemoglobin: 12.1 g/dL (ref 12.0–15.0)
MCH: 28.2 pg (ref 26.0–34.0)
MCHC: 32.7 g/dL (ref 30.0–36.0)
MCV: 86.2 fL (ref 80.0–100.0)
Platelets: 243 10*3/uL (ref 150–400)
RBC: 4.29 MIL/uL (ref 3.87–5.11)
RDW: 13.9 % (ref 11.5–15.5)
WBC: 5.4 10*3/uL (ref 4.0–10.5)
nRBC: 0 % (ref 0.0–0.2)

## 2019-07-25 NOTE — Plan of Care (Signed)
Pt able to complete ADLs independently. Pt involved in care and has positive attitude about current condition.

## 2019-07-25 NOTE — Discharge Summary (Addendum)
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 Hypertension    Past Surgical History:  Procedure Laterality Date  . ABDOMINAL HYSTERECTOMY    . TONSILLECTOMY      Allergies as of 07/25/2019   No Known Allergies     Medication List    STOP taking these medications   estradiol 0.5 MG tablet Commonly known as: ESTRACE     TAKE these medications   amLODipine 5 MG tablet Commonly known as: NORVASC Take by mouth.       LABORATORY STUDIES CBC    Component Value Date/Time   WBC 5.4 07/25/2019 0454   RBC 4.29 07/25/2019 0454   HGB 12.1 07/25/2019 0454   HCT 37.0 07/25/2019 0454   PLT 243 07/25/2019 0454   MCV 86.2 07/25/2019 0454   MCH 28.2 07/25/2019 0454   MCHC 32.7 07/25/2019 0454   RDW 13.9 07/25/2019 0454   LYMPHSABS 1.8 07/21/2019 1148   MONOABS 0.6 07/21/2019 1148   EOSABS 0.0 07/21/2019 1148   BASOSABS 0.0 07/21/2019 1148   CMP    Component Value Date/Time   NA 141 07/25/2019 0454   K 3.6 07/25/2019 0454   CL 105 07/25/2019 0454   CO2 27 07/25/2019 0454   GLUCOSE 104 (H) 07/25/2019 0454   BUN 10 07/25/2019 0454   CREATININE 0.70 07/25/2019 0454   CALCIUM 8.9 07/25/2019 0454   PROT 8.2 (H) 07/21/2019 1148   ALBUMIN 4.5 07/21/2019 1148   AST 23 07/21/2019 1148   ALT 20 07/21/2019 1148   ALKPHOS 53 07/21/2019 1148   BILITOT 1.0 07/21/2019 1148   GFRNONAA >60 07/25/2019 0454   GFRAA >60 07/25/2019 0454   COAGS Lab Results  Component Value Date   INR 1.0  07/21/2019   Lipid Panel    Component Value Date/Time   CHOL 172 07/23/2019 0555   TRIG 106 07/23/2019 0555   HDL 58 07/23/2019 0555   CHOLHDL 3.0 07/23/2019 0555   VLDL 21 07/23/2019 0555   LDLCALC 93 07/23/2019 0555   HgbA1C  Lab Results  Component Value Date   HGBA1C 5.4 07/23/2019   Urinalysis No results found for: COLORURINE, APPEARANCEUR, LABSPEC, PHURINE, GLUCOSEU, HGBUR, BILIRUBINUR, KETONESUR, PROTEINUR, UROBILINOGEN, NITRITE, LEUKOCYTESUR Urine Drug Screen     Component Value Date/Time   LABOPIA NONE DETECTED 07/23/2019 1928   COCAINSCRNUR NONE DETECTED 07/23/2019 1928   LABBENZ NONE DETECTED 07/23/2019 1928   AMPHETMU NONE DETECTED 07/23/2019 1928   THCU NONE DETECTED 07/23/2019 1928   LABBARB NONE DETECTED 07/23/2019 1928    Alcohol Level No results found for: [place] DIAGNOSTIC STUDIES CT HEAD WO CONTRAST 07/21/2019 3.3 x 2.3 x 3.2 cm acute parenchymal hemorrhage centered within the right frontoparietal white matter. Surrounding edema. Regional mass effect with leftward bowing of the falx but no midline shift at the level of the septum pellucidum. Given the hemorrhage location, consider MR venography to exclude venous thrombosis. Contrast-enhanced brain MRI is also recommended as the hematoma involutes to  exclude an underlying lesion A subtle focus of cortical/subcortical edema is questioned within the right occipital lobe. This is nonspecific, but may reflect a small infarct. This too could be further evaluated with brain MRI.   MRI Head W WO Contrast  07/22/2019 1. Motion degraded examination. 2. Unchanged size of a parenchymal hemorrhage centered within the right parietal white matter. Similar mass effect with partial effacement of the posterior right lateral ventricle and leftward bowing of the falx without midline shift at the level of the septum pellucidum. There are numerous small foci of supratentorial susceptibility weighted signal loss,  greatest within the right parietal, occipital and temporal lobes. 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 3. No appreciable abnormal enhancement at the hematoma site on the current study. However, a repeat contrast-enhanced brain MRI is recommended as the hematoma involutes to assess for an underlying lesion. 4. Moderate chronic small vessel ischemic disease.  MRA Head  07/22/2019 1. Significantly motion degraded, limited examination. 2. No proximal large vessel occlusion. 3. No large intracranial aneurysm is identified. No evidence of AVM.  DG Chest Port 1 View 07/21/2019 No acute abnormalities.   Transthoracic Echocardiogram  07/23/19 1. Left ventricular ejection fraction, by visual estimation, is 60 to 65%. The left ventricle has normal function. There is no left ventricular hypertrophy. 2. Left ventricular diastolic parameters are consistent with Grade I diastolic dysfunction (impaired relaxation). 3. The left ventricle has no regional wall motion abnormalities. 4. Global right ventricle has normal systolic function.The right ventricular size is normal. No increase in right ventricular wall thickness. 5. Left atrial size was normal. 6. Right atrial size was normal. 7. The mitral valve is normal in structure. No evidence of mitral valve regurgitation. No evidence of mitral stenosis. 8. The tricuspid valve is normal in structure. Tricuspid valve regurgitation is not demonstrated. 9. The aortic valve is normal in structure. Aortic valve regurgitation is not visualized. No evidence of aortic valve sclerosis or stenosis. 10. The pulmonic valve was not well visualized. Pulmonic valve regurgitation is not visualized.  Bilateral Carotid Dopplers 07/23/19 Summary: Right Carotid: The extracranial vessels were near-normal with only minimal wallthickening or plaque. Left Carotid: The extracranial vessels were near-normal with only minimal wallthickening or  plaque. Vertebrals: Bilateral vertebral arteries demonstrate antegrade flow. Subclavians: Normal flow hemodynamics were seen in bilateral subclavian arteries.  ECG - SR rate 73 BPM. (See cardiology reading for complete details)     HISTORY OF PRESENT ILLNESS Stacy Flores is an 63 y.o. female who [facility] this [facility] with confusion. LKN was on Wednesday. Later that day the patient's daughter was contacted by patient's significant other who stated she was having difficulty getting her pants on. She was complaining of a headache. Today, daughter was called again with report that patient was having difficulty walking and had put her pants on backwards. She also fell into the wall. She was taken to the ED, where she reported a right frontal headache with onset last night suddenly. A CT head was obtained, revealing a large right parietal lobe ICH.     HOSPITAL COURSE Ms. Stacy FairlyBrenda Franca is a 63 y.o. female with history of hypertension presenting with headache, confusion, difficulty walking, difficulty dressing herself, and recent fall. She did not receive IV t-PA due to ICH.  Stroke:  Acute small right frontoparietal ICH - likely due to CAA at that region shown on MRI  Code Stroke CT Head - not ordered  CT head - 3.3 x 2.3 x  3.2 cm acute parenchymal hemorrhage centered within the right frontoparietal white matter.   MRI head - Unchanged size of a parenchymal hemorrhage centered within the right parietal white matter. There are numerous small foci of supratentorial susceptibility weighted signal loss, greatest within the right parietal, occipital and temporal lobes. 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   MRA head - No proximal large vessel occlusion. No large intracranial aneurysm is identified. No evidence of AVM.  Carotid Doppler unremarkable  2D Echo - EF 60 - 65%. No cardiac source of emboli identified.   Sars Corona Virus 2 - negative  LDL -  93  HgbA1c - 5.4  UDS - negative  No antithrombotic prior to admission, now on No antithrombotic. Avoid antiplatelet in the future due to probable CAA  Therapy recommendations:  CIR->home with HH PT, OT, SLP, 24/7 supervision  Disposition:  return home  Possible CAA  MRI showed right temporal parietal and occipital numerous CMBs, possible CAA  Strict BP control  Longer term BP goal < 140  Avoid antiplatelet  OK with tylenol for pain  Hypertension  Home BP meds: amlodipine  Current BP meds: amlodipine   Off cleviprex  Stable  Long-term BP goal < 140  Hyperlipidemia  Home Lipid lowering medication: none   LDL - 93  Current lipid lowering medication: none given acute ICH and possible CAA  Other Stroke Risk Factors  Advanced age  ETOH use, advised to drink no more than 1 alcoholic beverage per day.  Overweight, Body mass index is 29.26 kg/m., recommend weight loss, diet and exercise as appropriate   Estrace not recommended for use in stroke patients. It was not resumed at discharge.  Other Active Problems  Hyponatremia, resolved  DISCHARGE EXAM Blood pressure (!) 151/81, pulse 66, temperature 98.3 F (36.8 C), temperature source Oral, resp. rate 14, height 5\' 2"  (1.575 m), weight 72.6 kg, SpO2 98 %. General - Well nourished, well developed, in no apparent distress.  Ophthalmologic - fundi not visualized due to noncooperation.  Cardiovascular - Regular rhythm and rate.  Mental Status -  Level of arousal and orientation to time, place, and person were intact. Language including expression, naming, repetition, comprehension was assessed and found intact.  Cranial Nerves II - XII - II - Visual field intact OU III, IV, VI - Extraocular movements intact. 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 VII - Facial movement intact bilaterally. VIII - Hearing & vestibular intact bilaterally. X - Palate elevates  symmetrically. XI - Chin turning & shoulder shrug intact bilaterally. XII - Tongue protrusion intact.  Motor Strength - The patient's strength was normal in all extremities and pronator drift was absent.  Bulk was normal and fasciculations were absent.   Motor Tone - Muscle tone was assessed at the neck and appendages and was normal.  Reflexes - The patient's reflexes were symmetrical in all extremities and she had no pathological reflexes.  Sensory - Light touch, temperature/pinprick were assessed and were symmetrical.    Coordination - The patient had normal movements in the hands with no ataxia or dysmetria.  Tremor was absent.  Gait and Station - deferred.  Discharge Diet   Regular thin liquids  DISCHARGE PLAN  Disposition:  Return home  Home health PT, OT, SLP. 24/7 supervision   Due to hemorrhage and CAA and long-term risk of bleeding, do not take aspirin, aspirin-containing medications, or ibuprofen products   Ongoing stroke risk factor control by Primary  Care Physician at time of discharge  Follow-up PCP in 2 weeks. Advised to get one if she does not have.   Follow-up in [facility] in 4 weeks, office to schedule an appointment.   35 minutes were spent preparing discharge.  Marvel Plan, MD PhD Stroke Neurology 07/25/2019 2:31 PM

## 2019-07-25 NOTE — Progress Notes (Signed)
Physical Therapy Treatment Patient Details Name: Stacy Flores MRN: [id number] DOB: 10-22-1955 Today's Date: 07/25/2019    History of Present Illness 63 y.o. female who presented 12/10 to the [facility] ED with confusion. CT revealed large right parietal lobe ICH.    PT Comments    Pt with improved ambulation distance and gait parameters this session, and accepts moderate challenge to gait without LOB. Pt with mild difficulty with head turning during gait, and moderate difficulty with turn and stop, stepping over obstacles, and stair navigation. Pt still with mild L inattention, but has developed compensatory head turning towards L to avoid obstacles located on L with min verbal cuing from PT as needed. Pt is more aware of L deficits and PT spoke with pt daughter who states she is also aware. Pt's daughters and boyfriend to assist pt 24/7 and PT recommended 24/7 assist with close supervision to assist pt with L obstacles as needed. Pt's daughter is understanding of this and very supportive of mother. PT feels pt's improved awareness of deficits coupled with improved hallway navigation with less frequent difficulty with obstacle navigation on L and 24/7 assist from family make pt appropriate to d/c home with HHPT. PT to continue to follow acutely.    Follow Up Recommendations  Home health PT;Supervision/Assistance - 24 hour     Equipment Recommendations  None recommended by PT    Recommendations for Other Services       Precautions / Restrictions Precautions Precautions: Fall;Other (comment) Precaution Comments: Lft inattention, left visual deficits Restrictions Weight Bearing Restrictions: No    Mobility  Bed Mobility               General bed mobility comments: up in chair upon PT arrival, requests back to chair upon PT exit  Transfers Overall transfer level: Needs assistance Equipment used: None Transfers: Sit to/from Stand Sit to Stand: Supervision         General  transfer comment: supervision for safety and for lines  Ambulation/Gait Ambulation/Gait assistance: Min guard;Supervision Gait Distance (Feet): 250 Feet Assistive device: None Gait Pattern/deviations: Step-through pattern;Decreased stride length;Drifts right/left Gait velocity: decreased   General Gait Details: slow and steady gait, bumped L hand on wall x2 when entering/exiting room but aware of this and course-corrected. No LOB or dizziness noted this session, even with dynamic gait challenges   Stairs             Wheelchair Mobility    Modified Rankin (Stroke Patients Only) Modified Rankin (Stroke Patients Only) Pre-Morbid Rankin Score: No symptoms Modified Rankin: Moderate disability     Balance Overall balance assessment: Needs assistance Sitting-balance support: No upper extremity supported;Feet supported Sitting balance-Leahy Scale: Good     Standing balance support: During functional activity Standing balance-Leahy Scale: Good Standing balance comment: minimal L inattention, orients to L well with min verbal cuing from PT                 Standardized Balance Assessment Standardized Balance Assessment : Dynamic Gait Index   Dynamic Gait Index Level Surface: Mild Impairment Change in Gait Speed: Mild Impairment Gait with Horizontal Head Turns: Mild Impairment Gait with Vertical Head Turns: Mild Impairment Gait and Pivot Turn: Moderate Impairment Step Over Obstacle: Moderate Impairment Step Around Obstacles: Mild Impairment Steps: Moderate Impairment Total Score: 13      Cognition Arousal/Alertness: Awake/alert Behavior During Therapy: WFL for tasks assessed/performed Overall Cognitive Status: Impaired/Different from baseline Area of Impairment: Safety/judgement;Awareness  Safety/Judgement: Decreased awareness of safety Awareness: Emergent   General Comments: Continued L inattention, but orients well with min  verbal cuing and navigates hallway obstacles on L side better without cuing from PT      Exercises      General Comments General comments (skin integrity, edema, etc.): VSS. L peripheral vision deficit, requires compensatory head turning to see far L periphery      Pertinent Vitals/Pain Pain Assessment: No/denies pain Pain Score: 0-No pain Pain Intervention(s): Monitored during session    Home Living                      Prior Function            PT Goals (current goals can now be found in the care plan section) Acute Rehab PT Goals PT Goal Formulation: With patient Time For Goal Achievement: 08/05/19 Potential to Achieve Goals: Good Progress towards PT goals: Progressing toward goals    Frequency    Min 4X/week      PT Plan Discharge plan needs to be updated    Co-evaluation              AM-PAC PT "6 Clicks" Mobility   Outcome Measure  Help needed turning from your back to your side while in a flat bed without using bedrails?: None Help needed moving from lying on your back to sitting on the side of a flat bed without using bedrails?: None Help needed moving to and from a bed to a chair (including a wheelchair)?: None Help needed standing up from a chair using your arms (e.g., wheelchair or bedside chair)?: A Little Help needed to walk in hospital room?: A Little Help needed climbing 3-5 steps with a railing? : A Little 6 Click Score: 21    End of Session Equipment Utilized During Treatment: Gait belt Activity Tolerance: Patient tolerated treatment well Patient left: in chair;with chair alarm set;with family/visitor present Nurse Communication: Mobility status PT Visit Diagnosis: Unsteadiness on feet (R26.81);Difficulty in walking, not elsewhere classified (R26.2)     Time: 1001-1017 PT Time Calculation (min) (ACUTE ONLY): 16 min  Charges:  $Gait Training: 8-22 mins                     [name], PT [place] Pager  [phone]  Office [phone]    [name] [place] 07/25/2019, 11:19 AM

## 2019-07-25 NOTE — Progress Notes (Signed)
Inpatient Rehabilitation-Admissions Coordinator   CIR consult received. Met with pt and her daughter at the bedside. We discussed that based on functional progress to date, pt does not need an IP Rehab stay. Pt and her daughter feel comfortable with returning home and [facility] services. I will contact TOC team.   AC will sign off.   Please call if questions.   Jhonnie Garner, OTR/L  Rehab Admissions Coordinator  [phone] 07/25/2019 12:00 PM

## 2019-08-17 ENCOUNTER — Inpatient Hospital Stay (INDEPENDENT_AMBULATORY_CARE_PROVIDER_SITE_OTHER): Payer: Commercial Managed Care - PPO | Admitting: Primary Care

## 2019-08-24 ENCOUNTER — Ambulatory Visit: Payer: Commercial Managed Care - PPO | Attending: Neurology | Admitting: Occupational Therapy

## 2019-08-24 ENCOUNTER — Ambulatory Visit: Payer: Commercial Managed Care - PPO | Admitting: Physical Therapy

## 2020-01-13 ENCOUNTER — Encounter (HOSPITAL_BASED_OUTPATIENT_CLINIC_OR_DEPARTMENT_OTHER): Payer: Self-pay | Admitting: Emergency Medicine

## 2020-01-13 ENCOUNTER — Inpatient Hospital Stay (HOSPITAL_BASED_OUTPATIENT_CLINIC_OR_DEPARTMENT_OTHER)
Admission: EM | Admit: 2020-01-13 | Discharge: 2020-01-15 | DRG: 101 | Disposition: A | Discharge status: Home / Self Care | Payer: Commercial Managed Care - PPO | Attending: Internal Medicine | Admitting: Internal Medicine

## 2020-01-13 ENCOUNTER — Emergency Department (HOSPITAL_BASED_OUTPATIENT_CLINIC_OR_DEPARTMENT_OTHER): Payer: Commercial Managed Care - PPO

## 2020-01-13 ENCOUNTER — Other Ambulatory Visit: Payer: Self-pay

## 2020-01-13 DIAGNOSIS — R29898 Other symptoms and signs involving the musculoskeletal system: Secondary | ICD-10-CM | POA: Diagnosis present

## 2020-01-13 DIAGNOSIS — I69398 Other sequelae of cerebral infarction: Secondary | ICD-10-CM

## 2020-01-13 DIAGNOSIS — I10 Essential (primary) hypertension: Secondary | ICD-10-CM | POA: Diagnosis present

## 2020-01-13 DIAGNOSIS — Z20822 Contact with and (suspected) exposure to covid-19: Secondary | ICD-10-CM | POA: Diagnosis present

## 2020-01-13 DIAGNOSIS — E785 Hyperlipidemia, unspecified: Secondary | ICD-10-CM | POA: Diagnosis present

## 2020-01-13 DIAGNOSIS — G40909 Epilepsy, unspecified, not intractable, without status epilepticus: Principal | ICD-10-CM | POA: Diagnosis present

## 2020-01-13 DIAGNOSIS — E854 Organ-limited amyloidosis: Secondary | ICD-10-CM | POA: Diagnosis present

## 2020-01-13 DIAGNOSIS — H5347 Heteronymous bilateral field defects: Secondary | ICD-10-CM | POA: Diagnosis present

## 2020-01-13 DIAGNOSIS — R253 Fasciculation: Secondary | ICD-10-CM | POA: Diagnosis present

## 2020-01-13 DIAGNOSIS — I680 Cerebral amyloid angiopathy: Secondary | ICD-10-CM | POA: Diagnosis present

## 2020-01-13 HISTORY — DX: Cerebral infarction, unspecified: I63.9

## 2020-01-13 LAB — COMPREHENSIVE METABOLIC PANEL
ALT: 21 U/L (ref 0–44)
AST: 21 U/L (ref 15–41)
Albumin: 4.5 g/dL (ref 3.5–5.0)
Alkaline Phosphatase: 55 U/L (ref 38–126)
Anion gap: 12 (ref 5–15)
BUN: 13 mg/dL (ref 8–23)
CO2: 25 mmol/L (ref 22–32)
Calcium: 9.4 mg/dL (ref 8.9–10.3)
Chloride: 100 mmol/L (ref 98–111)
Creatinine, Ser: 0.74 mg/dL (ref 0.44–1.00)
GFR calc Af Amer: >60 mL/min (ref >60)
GFR calc non Af Amer: >60 mL/min (ref >60)
Glucose, Bld: 104 mg/dL — ABNORMAL HIGH (ref 70–99)
Potassium: 3.6 mmol/L (ref 3.5–5.1)
Sodium: 137 mmol/L (ref 135–145)
Total Bilirubin: 0.5 mg/dL (ref 0.3–1.2)
Total Protein: 8 g/dL (ref 6.5–8.1)

## 2020-01-13 LAB — CBC
HCT: 40.6 % (ref 36.0–46.0)
Hemoglobin: 13.2 g/dL (ref 12.0–15.0)
MCH: 28.8 pg (ref 26.0–34.0)
MCHC: 32.5 g/dL (ref 30.0–36.0)
MCV: 88.5 fL (ref 80.0–100.0)
Platelets: 243 10*3/uL (ref 150–400)
RBC: 4.59 MIL/uL (ref 3.87–5.11)
RDW: 14.2 % (ref 11.5–15.5)
WBC: 6.8 10*3/uL (ref 4.0–10.5)
nRBC: 0 % (ref 0.0–0.2)

## 2020-01-13 LAB — URINALYSIS, ROUTINE W REFLEX MICROSCOPIC
Bilirubin Urine: NEGATIVE
Glucose, UA: NEGATIVE mg/dL
Hgb urine dipstick: NEGATIVE
Ketones, ur: NEGATIVE mg/dL
Leukocytes,Ua: NEGATIVE
Nitrite: NEGATIVE
Protein, ur: NEGATIVE mg/dL
Specific Gravity, Urine: <1.005 — ABNORMAL LOW (ref 1.005–1.030)
pH: 6 (ref 5.0–8.0)

## 2020-01-13 LAB — SARS CORONAVIRUS 2 BY RT PCR (HOSPITAL ORDER, PERFORMED IN ~~LOC~~ HOSPITAL LAB): SARS Coronavirus 2: NEGATIVE

## 2020-01-13 LAB — RAPID URINE DRUG SCREEN, HOSP PERFORMED
Amphetamines: NOT DETECTED
Barbiturates: NOT DETECTED
Benzodiazepines: NOT DETECTED
Cocaine: NOT DETECTED
Opiates: NOT DETECTED
Tetrahydrocannabinol: NOT DETECTED

## 2020-01-13 LAB — DIFFERENTIAL
Abs Immature Granulocytes: 0.04 10*3/uL (ref 0.00–0.07)
Basophils Absolute: 0 10*3/uL (ref 0.0–0.1)
Basophils Relative: 0 %
Eosinophils Absolute: 0.1 10*3/uL (ref 0.0–0.5)
Eosinophils Relative: 2 %
Immature Granulocytes: 1 %
Lymphocytes Relative: 33 %
Lymphs Abs: 2.3 10*3/uL (ref 0.7–4.0)
Monocytes Absolute: 0.7 10*3/uL (ref 0.1–1.0)
Monocytes Relative: 10 %
Neutro Abs: 3.7 10*3/uL (ref 1.7–7.7)
Neutrophils Relative %: 54 %

## 2020-01-13 LAB — PROTIME-INR
INR: 1 (ref 0.8–1.2)
Prothrombin Time: 12.3 seconds (ref 11.4–15.2)

## 2020-01-13 LAB — CBG MONITORING, ED: Glucose-Capillary: 99 mg/dL (ref 70–99)

## 2020-01-13 LAB — APTT: aPTT: 28 seconds (ref 24–36)

## 2020-01-13 LAB — ETHANOL: Alcohol, Ethyl (B): <10 mg/dL (ref <10)

## 2020-01-13 MED ORDER — LEVETIRACETAM IN NACL 1000 MG/100ML IV SOLN
1000.0000 mg | Freq: Once | INTRAVENOUS | Status: AC
  Administered 2020-01-13: 1000 mg via INTRAVENOUS
  Filled 2020-01-13: qty 100

## 2020-01-13 MED ORDER — LORAZEPAM 2 MG/ML IJ SOLN
1.0000 mg | Freq: Once | INTRAMUSCULAR | Status: AC
  Administered 2020-01-13: 1 mg via INTRAVENOUS

## 2020-01-13 MED ORDER — LORAZEPAM 2 MG/ML IJ SOLN
2.0000 mg | Freq: Once | INTRAMUSCULAR | Status: DC
  Filled 2020-01-13: qty 1

## 2020-01-13 MED ORDER — SODIUM CHLORIDE 0.9 % IV SOLN
INTRAVENOUS | Status: DC | PRN
  Administered 2020-01-13: 500 mL via INTRAVENOUS

## 2020-01-13 NOTE — Consult Note (Signed)
[facility] TeleNeurology Consult Services   Date of Service:   01/13/2020 16:15:33  Impression:     .  I63.89 - Cerebrovascular accident (CVA) due to other mechanism ([facility])     .  G40.9 - Epilepsy, unspecified  Comments/Sign-Out: The patient is a 64 year old woman with a history of hypertension, hyperlipidemia, cerebral amyloid angiopathy, right frontal ICH, who presents with L arm weakness and twitching. Concern is for CVA vs seizure. Not a candidate for acute stroke interventions. Recommend evaluation of vascular risk factors with MRI brain, MRA head/neck, TTE, lipid panel, HgbA1C. Tolerate permissive hypertension with BP goal < 185/110. No antiplatelets for now. Recommend ativan 2 mg IV x 1 now, keppra 1 g IV x 1 now, 750 mg BID maintenance, and EEG.  Metrics: Last Known Well: 01/13/2020 12:00:00 [facility] Notification Time: 01/13/2020 16:15:15 Arrival Time: 01/13/2020 15:57:00 Stamp Time: 01/13/2020 16:15:33 Time First Login Attempt: 01/13/2020 16:19:56 Symptoms: left sided weakness NIHSS Start Assessment Time: 01/13/2020 16:23:44 Patient is not a candidate for Alteplase/Activase. Alteplase Medical Decision: 01/13/2020 16:20:56 Patient was not deemed candidate for Alteplase/Activase thrombolytics because of following reasons: Current or Previous ICH.  CT head showed no acute hemorrhage or acute core infarct. CT head was reviewed and results were: No acute intracranial hemorrhage or definite evidence of acute infarction. Questionable small areas of loss of gray-white differentiation in left MCA and PCA territories are not consistent with reported history and probably on a technical basis  ED Physician notified of diagnostic impression and management plan on 01/13/2020 16:35:35  Advanced Imaging: Advanced Imaging Not Recommended because:  Clinical Presentation is not Suggestive of LVO and NIHSS is <6   Our recommendations are outlined  below.  Recommendations:     .  Activate Stroke Protocol Admission/Order Set     .  Stroke/Telemetry Floor     .  Neuro Checks     .  Bedside Swallow Eval     .  DVT Prophylaxis     .  IV Fluids, Normal Saline     .  Head of Bed 30 Degrees     .  Euglycemia and Avoid Hyperthermia (PRN Acetaminophen)     .  Hold Antithrombotics for Now   Sign Out:     .  Discussed with Emergency Department Provider    ------------------------------------------------------------------------------  History of Present Illness: Patient is a 64 year old Female.  Patient was brought by private transportation with symptoms of left sided weakness  Patient presents with weakness of the left arm starting 12 PM. Could not hold her phone. Daughter came to see patient. She seemed confused, could not put on flip flops. Had difficulty with walking, holding onto walls. En route to hospital, started having twitching of the left hand and arm. Occurring intermittently, mentation retained, semi-rhythmic repetitive opening-closing of left hand fingers noted. Past medical history includes cerebral amyloid angiopathy, right frontal ICH 12/20 with resolved weakness   Past Medical History:     . Hypertension     . Hyperlipidemia  Anticoagulant use:  No  Antiplatelet use: No    Examination: BP(128/52), Pulse(102), Blood Glucose(pending) 1A: Level of Consciousness - Alert; keenly responsive + 0 1B: Ask Month and Age - Both Questions Right + 0 1C: Blink Eyes & Squeeze Hands - Performs Both Tasks + 0 2: Test Horizontal Extraocular Movements - Normal + 0 3: Test Visual Fields - No Visual Loss + 0 4: Test Facial Palsy (Use Grimace if Obtunded) - Normal symmetry +  0 5A: Test Left Arm Motor Drift - Drift, but doesn't hit bed + 1 5B: Test Right Arm Motor Drift - No Drift for 10 Seconds + 0 6A: Test Left Leg Motor Drift - No Drift for 5 Seconds + 0 6B: Test Right Leg Motor Drift - No Drift for 5 Seconds + 0 7: Test  Limb Ataxia (FNF/Heel-Shin) - Ataxia in 1 Limb + 1 8: Test Sensation - Normal; No sensory loss + 0 9: Test Language/Aphasia - Normal; No aphasia + 0 10: Test Dysarthria - Normal + 0 11: Test Extinction/Inattention - No abnormality + 0  NIHSS Score: 2  Pre-Morbid Modified Ranking Scale: 0 Points = No symptoms at all   Patient/Family was informed the Neurology Consult would occur via TeleHealth consult by way of interactive audio and video telecommunications and consented to receiving care in this manner.   Patient is being evaluated for possible acute neurologic impairment and high probability of imminent or life-threatening deterioration. I spent total of 20 minutes providing care to this patient, including time for face to face visit via telemedicine, review of medical records, imaging studies and discussion of findings with providers, the patient and/or family.   Dr Serita Grammes   [facility] [phone]  Case [id number]

## 2020-01-13 NOTE — Progress Notes (Signed)
Patient is a 64 year old female with history of HTN,HLD, hemorrhagic stroke, cerebral myeloid angiopathy who presented to [facility] with complaints of left-sided deficits.  At around 12 PM today, she started having numbness and weakness of left upper extremity and  she could not grab her phone.  Daughter also noticed that she had difficulty walking.  She also had involuntary twitching movements of the muscles of left upper extremity.  On presentation, she had a pronator drift and weak grip strength of the left upper extremity.  Stroke suspected and code stroke called.  CT head did not show any acute intracranial abnormalities.  Teleneurology consulted and recommended to be transferred to [facility] for evaluation of possible stroke versus partial focal seizures.  Recommended MRI brain, MRA head and neck, EEG. Patient accepted for telemetry bed at [facility]. Since seizure couldn't  be ruled out, she was also given a dose of Keppra 1 g IV .

## 2020-01-13 NOTE — ED Triage Notes (Signed)
Per daughter pt started having some numbness and jumping to left arm at 1200.  Daughter states she noticed she was having some difficulty understanding what was said, and some difficulty ambulating.  Pt has history of stroke with some vision loss to right eye but had a full recovery otherwise.   Pt does have some muscle twitching to left shoulder.

## 2020-01-13 NOTE — ED Provider Notes (Addendum)
[facility] EMERGENCY DEPARTMENT Provider Note   CSN: [id number] Arrival date & time: 01/13/20  1557     History Chief Complaint  Patient presents with  . Stroke Symptoms    Stacy Flores is a 64 y.o. female past medical history of cerebral hemorrhage, hyperlipidemia, hypertension brought in for evaluation of left upper extremity numbness/weakness that began at approximately 12:30 PM.  Patient reports that she initially felt like her finger and hands were numb and then started feeling some numbness in her entire left upper extremity.  She states that her arm started twitching and moving which was causing her pain.  The daughter came over to get her and reports that while walking to the car, she had some difficulty walking.  They also felt like her eyes were gazing towards the left.  Patient with history of intracerebral hemorrhage and daughter feels like this is what her symptoms were previously.  She had been in her previous state of health prior to onset of symptoms.  Daughter reports that she had had some decreased grip strength in left upper extremity after the stroke but had worked with physical therapy and had regained most of it back.  She is not on blood thinners.  Patient denies any chest pain, difficulty breathing, abdominal pain, nausea/vomiting.  No falls.  Daughter denies any slurred speech, facial drooping.  The history is provided by the patient and a relative.       Past Medical History:  Diagnosis Date  . Hypertension   . Stroke [facility])     Patient Active Problem List   Diagnosis Date Noted  . Weakness of left upper extremity 01/13/2020  . Cerebral amyloid angiopathy (CODE) 07/25/2019  . Essential hypertension 07/25/2019  . Hyperlipemia 07/25/2019  . 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 ICH (intracerebral hemorrhage) (HCC) 07/21/2019    Past Surgical History:  Procedure Laterality Date  . ABDOMINAL HYSTERECTOMY    .  TONSILLECTOMY       OB History   No obstetric history on file.     History reviewed. No pertinent family history.  Social History   Tobacco Use  . Smoking status: Never Smoker  . Smokeless tobacco: Never Used  Substance Use Topics  . Alcohol use: Yes  . Drug use: Never    Home Medications Prior to Admission medications   Medication Sig Start Date End Date Taking? Authorizing Provider  amLODipine (NORVASC) 5 MG tablet Take by mouth. 12/18/16   [provider]    Allergies    Patient has no known allergies.  Review of Systems   Review of Systems  Constitutional: Negative for fever.  Respiratory: Negative for cough and shortness of breath.   Cardiovascular: Negative for chest pain.  Gastrointestinal: Negative for abdominal pain, nausea and vomiting.  Genitourinary: Negative for dysuria and hematuria.  Musculoskeletal: Positive for gait problem.  Neurological: Positive for weakness and numbness. Negative for facial asymmetry and speech difficulty.  All other systems reviewed and are negative.   Physical Exam Updated Vital Signs BP 123/80   Pulse 77   Resp 14   Ht 5\' 3"  (1.6 m)   Wt 71.7 kg   SpO2 97%   BMI 27.99 kg/m   Physical Exam Vitals and nursing note reviewed.  Constitutional:      Appearance: Normal appearance. She is well-developed.  HENT:     Head: Normocephalic and atraumatic.  Eyes:     General: Lids are normal.  Conjunctiva/sclera: Conjunctivae normal.     Pupils: Pupils are equal, round, and reactive to light.     Comments: Slight left-sided gaze preference but is able to do full EOMs without any difficulty. PERRL.   Cardiovascular:     Rate and Rhythm: Regular rhythm. Tachycardia present.     Pulses: Normal pulses.          Radial pulses are 2+ on the right side and 2+ on the left side.     Heart sounds: Normal heart sounds. No murmur. No friction rub. No gallop.   Pulmonary:     Effort: Pulmonary effort is normal.     Breath  sounds: Normal breath sounds.     Comments: Lungs clear to auscultation bilaterally.  Symmetric chest rise.  No wheezing, rales, rhonchi. Abdominal:     Palpations: Abdomen is soft. Abdomen is not rigid.     Tenderness: There is no abdominal tenderness. There is no guarding.  Musculoskeletal:        General: Normal range of motion.     Cervical back: Full passive range of motion without pain.     Comments: Tricep fasciculation noted to left upper extremity.  Skin:    General: Skin is warm and dry.     Capillary Refill: Capillary refill takes less than 2 seconds.     Comments: Left upper extremity is warm without any signs of dusky appearance or cool to touch. Good distal cap refill.   Neurological:     Mental Status: She is alert and oriented to person, place, and time.     Comments: Cranial nerves III-XII intact Alert and oriented x3. Follows commands, Moves all extremities  5/5 strength to right upper and bilateral lower extremities. Initial strength seem to be 3/5 of left upper extremity with slightly improved to 4/5.  Unequal grip strength noted.  She cannot grip a phone. Dysmetria noted.  Slight drift of LUE No slurred speech. No facial droop.   Psychiatric:        Speech: Speech normal.     ED Results / Procedures / Treatments   Labs (all labs ordered are listed, but only abnormal results are displayed) Labs Reviewed  COMPREHENSIVE METABOLIC PANEL - Abnormal; Notable for the following components:      Result Value   Glucose, Bld 104 (*)    All other components within normal limits  URINALYSIS, ROUTINE W REFLEX MICROSCOPIC - Abnormal; Notable for the following components:   Specific Gravity, Urine <1.005 (*)    All other components within normal limits  SARS CORONAVIRUS 2 BY RT PCR (HOSPITAL ORDER, [place] [facility])  ETHANOL  PROTIME-INR  APTT  CBC  DIFFERENTIAL  RAPID URINE DRUG SCREEN, HOSP PERFORMED  CBG MONITORING, ED    EKG EKG  Interpretation  Date/Time:  Friday January 13 2020 16:02:25 EDT Ventricular Rate:  92 PR Interval:    QRS Duration: 80 QT Interval:  372 QTC Calculation: 461 R Axis:   25 Text Interpretation: Sinus rhythm Borderline T abnormalities, diffuse leads No significant change since last tracing Confirmed by Theotis Burrow [phone]) on 01/13/2020 4:26:46 PM   Radiology CT HEAD CODE STROKE WO CONTRAST  Result Date: 01/13/2020 CLINICAL DATA:  Code stroke.  Left arm numbness EXAM: CT HEAD WITHOUT CONTRAST TECHNIQUE: Contiguous axial images were obtained from the base of the skull through the vertex without intravenous contrast. COMPARISON:  11/15/2019 FINDINGS: Brain: There is no acute intracranial hemorrhage. Subcentimeter focus of hyperdensity in the  region of encephalomalacia secondary to prior right parietal hemorrhage likely reflects mineralization. Questionable small areas of loss of gray-white differentiation in the left MCA and PCA territories, which are not consistent with reported history and may be on a technical basis. Ventricles are normal in size. Additional patchy and confluent areas hypoattenuation in the supratentorial white matter nonspecific probably reflect stable chronic microvascular ischemic changes. Vascular: No hyperdense vessel. Skull: Unremarkable. Sinuses/Orbits: Right posterior ethmoid focal opacification. Orbits are unremarkable. Other: Mastoid air cells are clear. IMPRESSION: No acute intracranial hemorrhage or definite evidence of acute infarction. Questionable small areas of loss of gray-white differentiation in left MCA and PCA territories are not consistent with reported history and probably on a technical basis. Electronically Signed   By: Guadlupe Spanish M.D.   On: 01/13/2020 16:35    Procedures .Critical Care Performed by: Maxwell Caul, PA-C Authorized by: Maxwell Caul, PA-C   Critical care provider statement:    Critical care time (minutes):  35   Critical care  was necessary to treat or prevent imminent or life-threatening deterioration of the following conditions:  CNS failure or compromise (CVA)   Critical care was time spent personally by me on the following activities:  Discussions with consultants, evaluation of patient's response to treatment, examination of patient, ordering and performing treatments and interventions, ordering and review of laboratory studies, ordering and review of radiographic studies, pulse oximetry, re-evaluation of patient's condition, obtaining history from patient or surrogate and review of old charts   (including critical care time)  Medications Ordered in ED Medications  0.9 %  sodium chloride infusion (500 mLs Intravenous New Bag/Given 01/13/20 1644)  levETIRAcetam (KEPPRA) IVPB 1000 mg/100 mL premix (0 mg Intravenous Stopped 01/13/20 1708)  LORazepam (ATIVAN) injection 1 mg (1 mg Intravenous Given 01/13/20 1645)    ED Course  I have reviewed the triage vital signs and the nursing notes.  Pertinent labs & imaging results that were available during my care of the patient were reviewed by me and considered in my medical decision making (see chart for details).    MDM Rules/Calculators/A&P                      64 year old female who presents for evaluation of left upper extremity numbness/weakness everything began about 12:30 PM.  Daughter reports history of stroke and was concerned that symptoms were the same.  Patient felt like she was having numbness in her fingers and then felt like it went to her entire arm.  Daughter said she could not grip or move the arm.  It was moving involuntarily and had some twitching.  Additionally, daughter felt like she was having some trouble walking.  No falls.  No blood thinners.  On initially arrival, she is afebrile, slightly tachycardic.  Vitals otherwise stable.  On exam, she has some left-sided gaze preference but is able to look midline without any difficulty.  EOMs intact.  Initially  she had some tricep fasciculation noted to the left upper extremity.  During this time, she had some 3/5 strength of the left upper extremity and was noted to have it continuously fall down to her side.  Once the fasciculation improved, she still had decreased range but it was about 4/5.  She had slight dysmetria as well as of left upper extremity.  He is still within the 4-hour window and discussed with Dr. Clarene Duke who agreed to initiate code stroke.  CMP shows normal BUN and creatinine.  INR  is unremarkable.  CBC shows no leukocytosis or anemia.  CT head shows no acute intracranial hemorrhage or definitive acute infarction.  I discussed with Dr. Imogene Burn (teleneuro).  Patient had symptoms of the same fasciculations/twitching on his evaluation. He is concerned the patient is having an acute stroke.  But at this time, given her history of hemorrhage, she is not a candidate for TPA.  He concerned about partial seizure.  He recommends giving Ativan, loading dose of Keppra.  Patient will need work-up for MRI and EEG for evaluation of stroke and possible seizure.  Discussed patient with Dr. Otelia Limes (Neurology). Agrees with plan for medical admission and stroke work up. Will plan to consult.  Patient will be admitted to [facility].  Discussed patient with Dr. Renford Dills (hospitalist) who accepts for admission.   Portions of this note were generated with Scientist, clinical (histocompatibility and immunogenetics). Dictation errors may occur despite best attempts at proofreading.   Final Clinical Impression(s) / ED Diagnoses Final diagnoses:  Weakness of left upper extremity    Rx / DC Orders ED Discharge Orders    None       Maxwell Caul, PA-C 01/13/20 2342    Maxwell Caul, PA-C 01/13/20 2342    Little, Ambrose Finland, MD 01/15/20 1126

## 2020-01-13 NOTE — ED Notes (Signed)
Patient claimed that she can not stopped her left arm specially her fingers from twitching.  She also stated that her arm is numbed and cold.  Ativan was given as ordered.  Patient is resting at this time with daughter at bedside.

## 2020-01-13 NOTE — ED Notes (Signed)
Patient has a ready bed at [facility], [facility] called for transport spoke with Selena Batten at [phone]

## 2020-01-14 ENCOUNTER — Encounter (HOSPITAL_COMMUNITY): Payer: Self-pay | Admitting: Family Medicine

## 2020-01-14 ENCOUNTER — Other Ambulatory Visit (HOSPITAL_COMMUNITY): Payer: Commercial Managed Care - PPO

## 2020-01-14 ENCOUNTER — Inpatient Hospital Stay (HOSPITAL_COMMUNITY): Payer: Commercial Managed Care - PPO

## 2020-01-14 DIAGNOSIS — R569 Unspecified convulsions: Secondary | ICD-10-CM

## 2020-01-14 DIAGNOSIS — R253 Fasciculation: Secondary | ICD-10-CM | POA: Diagnosis present

## 2020-01-14 MED ORDER — SODIUM CHLORIDE 0.9 % IV SOLN: INTRAVENOUS | Status: AC

## 2020-01-14 MED ORDER — LABETALOL HCL 5 MG/ML IV SOLN: 10.0000 mg | INTRAVENOUS | Status: DC | PRN

## 2020-01-14 MED ORDER — ENOXAPARIN SODIUM 40 MG/0.4ML ~~LOC~~ SOLN
40.0000 mg | SUBCUTANEOUS | Status: DC
  Filled 2020-01-14: qty 0.4

## 2020-01-14 MED ORDER — LEVETIRACETAM 750 MG PO TABS
750.0000 mg | ORAL_TABLET | Freq: Two times a day (BID) | ORAL | Status: DC
  Administered 2020-01-14 – 2020-01-15 (×3): 750 mg via ORAL
  Filled 2020-01-14 (×3): qty 1

## 2020-01-14 MED ORDER — ACETAMINOPHEN 160 MG/5ML PO SOLN: 650.0000 mg | ORAL | Status: DC | PRN

## 2020-01-14 MED ORDER — LORAZEPAM 2 MG/ML IJ SOLN
1.0000 mg | INTRAMUSCULAR | Status: AC | PRN
  Administered 2020-01-14: 1 mg via INTRAVENOUS
  Filled 2020-01-14: qty 1

## 2020-01-14 MED ORDER — ACETAMINOPHEN 650 MG RE SUPP: 650.0000 mg | RECTAL | Status: DC | PRN

## 2020-01-14 MED ORDER — STROKE: EARLY STAGES OF RECOVERY BOOK
Freq: Once | Status: DC
  Filled 2020-01-14: qty 1

## 2020-01-14 MED ORDER — SENNOSIDES-DOCUSATE SODIUM 8.6-50 MG PO TABS: 1.0000 | ORAL_TABLET | Freq: Every evening | ORAL | Status: DC | PRN

## 2020-01-14 MED ORDER — ACETAMINOPHEN 325 MG PO TABS: 650.0000 mg | ORAL_TABLET | ORAL | Status: DC | PRN

## 2020-01-14 NOTE — Consult Note (Addendum)
Neurology Consultation  Reason for Consult: Left arm numbness and twitching Referring Physician: Dr. Odie Sera, [facility] hospitalist  CC: Left arm twitching and numbness  History is obtained from: Chart review, patient  HPI: Stacy Flores is a 64 y.o. 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  Somewhere around little afternoon on 01/13/2020, she developed some tingling that marched from her fingertips all the way up to the shoulders and involving the whole arm twitching.  She was having difficulty talking and also difficulty with her gait at that time.  There was no facial droop noted. She said that the numbness and the weakness felt the same as with her stroke in December 2020.  She did not have any residual deficits for the last neurology note. A telemedicine neurology consultation was obtained as she was in the window for IV TPA when she presented but was deemed not a candidate for TPA and was recommended to be transferred over to [facility] for brain imaging and EEG with recommendations to do a full stroke work-up and also evaluation for seizures. She was given IV Ativan, loaded with Keppra as well. She denies any seizure-like activity in the past.  Denies any fevers chills or preceding illnesses sicknesses at this time. Family is not available at this time at bedside Does not complain of ongoing headaches or memory issues.   LKW: 12 PM 01/13/2020 tpa given?: no, decision made by telemedicine neurology on initial contact due to possible seizures, recent history of ICH due to cerebral amyloid angiopathy -outside the window while at [facility]. Premorbid modified Rankin scale (mRS): 0  ROS: Review of systems performed and negative except  noted in HPI  Past Medical History:  Diagnosis Date  . Hypertension   . Stroke [facility])    History reviewed. No pertinent family history.   Social History:   reports that she has never smoked. She has never used smokeless tobacco. She reports current alcohol use. She reports that she does not use drugs.  Medications  Current Facility-Administered Medications:  .   stroke: mapping our early stages of recovery book, , Does not apply, Once, Opyd, Timothy S, MD .  0.9 %  sodium chloride infusion, , Intravenous, PRN, Opyd, Lavone Neri, MD, Last Rate: 10 mL/hr at 01/13/20 1644, 500 mL at 01/13/20 1644 .  0.9 %  sodium chloride infusion, , Intravenous, Continuous, Opyd, Lavone Neri, MD, Last Rate: 75 mL/hr at 01/14/20 0346, New Bag at 01/14/20 0346 .  acetaminophen (TYLENOL) tablet 650 mg, 650 mg, Oral, Q4H PRN **OR** acetaminophen (TYLENOL) 160 MG/5ML solution 650 mg, 650 mg, Per Tube, Q4H PRN **OR** acetaminophen (TYLENOL) suppository 650 mg, 650 mg, Rectal, Q4H PRN, Opyd, Timothy S, MD .  enoxaparin (LOVENOX) injection 40 mg, 40 mg, Subcutaneous, Q24H, Opyd, Timothy S, MD .  labetalol (NORMODYNE) injection 10 mg, 10 mg, Intravenous, Q2H PRN, Opyd, Timothy S, MD .  levETIRAcetam (KEPPRA) tablet 750 mg, 750 mg, Oral, BID, Opyd, Timothy S, MD .  senna-docusate (Senokot-S) tablet 1 tablet, 1 tablet, Oral, QHS PRN, Opyd, Lavone Neri, MD   Exam: Current vital signs: BP (!) 147/63 (BP Location: Left Arm)   Pulse 81   Temp (!) 97.5 F (36.4 C) (Oral)   Resp 19   Ht 5\' 3"  (1.6 m)  Wt 71.7 kg   SpO2 99%   BMI 27.99 kg/m  Vital signs in last 24 hours: Temp:  [97.5 F (36.4 C)-98.1 F (36.7 C)] 97.5 F (36.4 C) (06/05 0352) Pulse Rate:  [72-102] 81 (06/05 0352) Resp:  [12-28] 19 (06/05 0352) BP: (106-150)/(52-98) 147/63 (06/05 0352) SpO2:  [95 %-100 %] 99 % (06/05 0352) Weight:  [71.7 kg] 71.7 kg (06/04 1607) General: Sleeping, had received Ativan for MRI which was done a little prior to this  consultation. HEENT: Normocephalic atraumatic CVS: Regular rhythm Respiratory: Breathing normally saturating well on room air Extremities warm well perfused Neurological exam Was sleeping in bed, open eyes to voice, follows all commands and had a conversation with me to provide some history but kept falling asleep and appeared lethargic-likely secondary to the Ativan that was given to her. During my examination, she was not dysarthric.  She had no difficulty naming objects or following commands.  Repetition was intact. Attention concentration was mildly reduced. Cranial nerves: Pupils are equal round reactive to light, extraocular movements are intact but the eyes are mildly disconjugate, she has a left hemianopsia, facial symmetry is rather preserved, facial sensation is preserved, tongue and palate midline. Motor exam: She is antigravity without vertical drift in all 4 extremities. Sensory exam: Intact to touch all over without extinction Coordination: Intact finger-nose-finger test. NIH stroke scale-2  Labs I have reviewed labs in epic and the results pertinent to this consultation are:  CBC    Component Value Date/Time   WBC 6.8 01/13/2020 1620   RBC 4.59 01/13/2020 1620   HGB 13.2 01/13/2020 1620   HCT 40.6 01/13/2020 1620   PLT 243 01/13/2020 1620   MCV 88.5 01/13/2020 1620   MCH 28.8 01/13/2020 1620   MCHC 32.5 01/13/2020 1620   RDW 14.2 01/13/2020 1620   LYMPHSABS 2.3 01/13/2020 1620   MONOABS 0.7 01/13/2020 1620   EOSABS 0.1 01/13/2020 1620   BASOSABS 0.0 01/13/2020 1620    CMP     Component Value Date/Time   NA 137 01/13/2020 1620   K 3.6 01/13/2020 1620   CL 100 01/13/2020 1620   CO2 25 01/13/2020 1620   GLUCOSE 104 (H) 01/13/2020 1620   BUN 13 01/13/2020 1620   CREATININE 0.74 01/13/2020 1620   CALCIUM 9.4 01/13/2020 1620   PROT 8.0 01/13/2020 1620   ALBUMIN 4.5 01/13/2020 1620   AST 21 01/13/2020 1620   ALT 21 01/13/2020 1620   ALKPHOS 55 01/13/2020  1620   BILITOT 0.5 01/13/2020 1620   GFRNONAA >60 01/13/2020 1620   GFRAA >60 01/13/2020 1620   Imaging I have reviewed the images obtained: CT[facility] at [facility] showed no evidence of acute infarction.  There were some questionable small areas of loss of gray-white differentiation in the left MCA PCA territories which were probably artifactual  MRI brain without contrast-completed-official read pending.  No evidence of acute infarction on my preliminary review.  SWI imaging suggestive of changes consistent with cerebral amyloid angiopathy.  Extensively motion riddled flair images with right frontoparietal encephalomalacia as well as chronic white matter disease.  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 I do not see a role for doing a stroke work-up. I do agree  with performing an EEG and do agree with antiepileptics.  See detailed recommendations below Other differentials could include amyloid spells.  Impression: -New onset seizure-likely secondary to encephalomalacia from the prior ICH involving the right frontoparietal lobe. -Other differentials to consider-amyloid spells -History of ICH secondary to cerebral amyloid angiopathy -Residual left hemianopsia  Recommendations: -I am discontinuing echocardiogram A1c and lipid panel due to the above reasons -Continue frequent neurochecks -Agree with Keppra 750 twice daily. -Seizure precautions -Routine EEG in the morning -Follow official MRA reading, MRI reviewed with neurorads and negative for stroke. -Upon discharge, patient needs to be counseled for no driving for at least 6 months unless seizure-free  according to [facility].  Detailed seizure precautions below.  I would also recommend primary team remove the diagnosis of acute ICH or stroke from her chart as she does not have an acute ICH or an acute ischemic stroke for this admission.  SEIZURE PRECAUTIONS Per [facility] statutes, patients with seizures are not allowed to drive until they have been seizure-free for six months.   Use caution when using heavy equipment or power tools. Avoid working on ladders or at heights. Take showers instead of baths. Ensure the water temperature is not too high on the home water heater. Do not go swimming alone. Do not lock yourself in a room alone (i.e. bathroom). When caring for infants or small children, sit down when holding, feeding, or changing them to minimize risk of injury to the child in the event you have a seizure. Maintain good sleep hygiene. Avoid alcohol.   If patient has another seizure, call 911 and bring them back to the ED if: A. The seizure lasts longer than 5 minutes.  B. The patient doesn't wake shortly after the seizure or has new problems such as difficulty seeing, speaking or moving following the seizure C. The patient was injured during the seizure D. The patient has a temperature over 102 F (39C) E. The patient vomited during the seizure and now is having trouble breathing  -- Amie Portland, MD [facility] Pager: [phone] If 7pm to 7am, please call on call as listed on AMION.

## 2020-01-14 NOTE — Evaluation (Signed)
Occupational Therapy Evaluation Patient Details Name: Stacy Flores MRN: [id number] DOB: Mar 16, 1956 Today's Date: 01/14/2020    History of Present Illness 64 yo female admitted to [facility] with L side weakness. 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 Pt currently with functional limitiations due to the deficits listed below (see OT problem list). Daughter expressed concerns with cognition not being back to baseline during session. Ot to follow for cognitive and balance deficits. Pt oriented x4 this session. OT to further access executive functional deficits.  Pt will benefit from skilled OT to increase their independence and safety with adls and balance to allow discharge home.     Follow Up Recommendations  No OT follow up    Equipment Recommendations  None recommended by OT    Recommendations for Other Services       Precautions / Restrictions Precautions Precaution Comments: seizure      Mobility Bed Mobility Overal bed mobility: Modified Independent                Transfers Overall transfer level: Needs assistance   Transfers: Sit to/from Stand Sit to Stand: Min assist         General transfer comment: pt provided hand held (A) as first time up and visual deficits    Balance Overall balance assessment: Mild deficits observed, not formally tested                                         ADL either performed or assessed with clinical judgement   ADL Overall ADL's : Needs assistance/impaired Eating/Feeding: Modified independent   Grooming: Wash/dry face;Supervision/safety;Standing   Upper Body Bathing: Supervision/ safety;Standing   Lower Body Bathing: Supervison/ safety;Sit to/from stand   Upper Body Dressing :  Minimal assistance;Standing Upper Body Dressing Details (indicate cue type and reason): (A) to don gown in standing and needed mod cues due to visual deficits      Toilet Transfer: Minimal assistance           Functional mobility during ADLs: Minimal assistance General ADL Comments: pt able to take sink bath due to leak in pure wick in bed      Vision Baseline Vision/History: Wears glasses Wears Glasses: At all times Patient Visual Report: Other (comment)(hemianopsia)       Perception     Praxis      Pertinent Vitals/Pain Pain Assessment: No/denies pain     Hand Dominance Right   Extremity/Trunk Assessment Upper Extremity Assessment Upper Extremity Assessment: Overall WFL for tasks assessed   Lower Extremity Assessment Lower Extremity Assessment: Defer to PT evaluation   Cervical / Trunk Assessment Cervical / Trunk Assessment: Normal   Communication Communication Communication: No difficulties   Cognition Arousal/Alertness: Awake/alert Behavior During Therapy: WFL for tasks assessed/performed Overall Cognitive Status: Within Functional Limits for tasks assessed                                 General Comments: daughter reports cognition is not baseline. pt able to recall location, reason for admission, date and room number this session.    General Comments  126/80 supine HR 85 O2  98%    Exercises     Shoulder Instructions      Home Living Family/patient expects to be discharged to:: Private residence Living Arrangements: Spouse/significant other Available Help at Discharge: Family;Available 24 hours/day Type of Home: House Home Access: Stairs to enter [employer] of Steps: 1 (threshold) Entrance Stairs-Rails: None Home Layout: One level     Bathroom Shower/Tub: IT trainer: Standard     Home Equipment: None          Prior Functioning/Environment Level of Independence: Independent         Comments: loves to eat (ice cream - butter peacan)        OT Problem List: Impaired balance (sitting and/or standing);Decreased cognition      OT Treatment/Interventions: Self-care/ADL training;Cognitive remediation/compensation;Patient/family education;Balance training    OT Goals(Current goals can be found in the care plan section) Acute Rehab OT Goals Patient Stated Goal: to eat ice cream OT Goal Formulation: With patient/family Time For Goal Achievement: 01/28/20 Potential to Achieve Goals: Good  OT Frequency: Min 2X/week   Barriers to D/C:            Co-evaluation              AM-PAC OT "6 Clicks" Daily Activity     Outcome Measure Help from another person eating meals?: A Little Help from another person taking care of personal grooming?: A Little Help from another person toileting, which includes using toliet, bedpan, or urinal?: A Little Help from another person bathing (including washing, rinsing, drying)?: A Little Help from another person to put on and taking off regular upper body clothing?: A Little Help from another person to put on and taking off regular lower body clothing?: A Little 6 Click Score: 18   End of Session Nurse Communication: Mobility status;Precautions  Activity Tolerance: Patient tolerated treatment well Patient left: Other (comment)(exiting room with PT for session)  OT Visit Diagnosis: Unsteadiness on feet (R26.81);Muscle weakness (generalized) (M62.81)                Time: 2585-2778 OT Time Calculation (min): 23 min Charges:  OT General Charges $OT Visit: 1 Visit   Timmothy Euler, OTR/L  Acute Rehabilitation Services Pager: [phone] Office: [phone] .   Mateo Flow 01/14/2020, 2:10 PM

## 2020-01-14 NOTE — Procedures (Signed)
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 EEG to evaluate for seizure.  Level of alertness: Awake, drowsy, sleep, comatose, lethargic  AEDs during EEG study: LEV, ativan  Technical aspects: This EEG study was done with scalp electrodes positioned according to the 10-20 International system of electrode placement. Electrical activity was acquired at a sampling rate of 500Hz  and reviewed with a high frequency filter of 70Hz  and a low frequency filter of 1Hz . EEG data were recorded continuously and digitally stored.   Description: The posterior dominant rhythm consists of 9 Hz activity of moderate voltage (25-35 uV) seen predominantly in posterior head regions, asymmetric ( R<L) and reactive to eye opening and eye closing. Sleep was characterized by vertex waves, sleep spindles (12 to 14 Hz), maximal frontocentral region. EEG showed continuous 3 to 6 Hz theta-delta slowing in right hemisphere, maximal right temporo-parietal region. Broadly distributed sharp transient was also seen in right hemisphere.  Physiology photic driving was not seen during photic stimulation.  Hyperventilation was not performed.     ABNORMALITY -Continued slow, right hemisphere, maximal right temporo-parietal region - Background asymmetry, Right <Left  IMPRESSION: This study is suggestive of cortical dysfunction in right hemisphere, maximal right temporo-parietal region likely secondary to underlying structural abnormality. No seizures or definite epileptiform discharges were seen throughout  the recording.  Stacy Flores 

## 2020-01-14 NOTE — Progress Notes (Signed)
EEG complete - results pending 

## 2020-01-14 NOTE — Evaluation (Signed)
Physical Therapy Evaluation Patient Details Name: Stacy Flores MRN: [id number] DOB: 06/13/1956 Today's Date: 01/14/2020   History of Present Illness  64 y.o. 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 She was having difficulty talking and also difficulty with her gait as well.  Clinical Impression  Pt presents to PT with deficits in cognition, gait, balance, and with chronic deficits in vision. Pt with chronic L hemianopsia which seems to affect gait and balance as pt is mobilizing in an unfamiliar environment. Pt gait does improve with continued ambulation during session, but the pt does continue to require close supervision for safety. Pt with some slowed processing and long-term memory deficits during session and pt's daughter reports that the pt is not back to baseline cognition at this time. Pt will continue to benefit from acute PT POC to assess more dual task balance and gait activities to ensure stability with higher level mobility tasks. PT currently recommends outpatient PT at the time of discharge.    Follow Up Recommendations Outpatient PT(for gait training, may progress to no needs)    Equipment Recommendations  None recommended by PT    Recommendations for Other Services       Precautions / Restrictions Precautions Precautions: Fall Precaution Comments: seizure, L hemianopsia Restrictions Weight Bearing Restrictions: No      Mobility  Bed Mobility Overal bed mobility: Modified Independent                Transfers Overall transfer level: Needs assistance Equipment used: 1 person hand held assist Transfers: Sit to/from Stand Sit to Stand: Min assist         General transfer comment: pt provided hand held (A) as first time  up and visual deficits  Ambulation/Gait Ambulation/Gait assistance: Min guard;Supervision Gait Distance (Feet): 120 Feet Assistive device: None Gait Pattern/deviations: Step-through pattern Gait velocity: reduced Gait velocity interpretation: 1.31 - 2.62 ft/sec, indicative of limited community ambulator General Gait Details: pt with step through gait, bumping into one object on left side initially during gait. Pt is able to read signs to left side but does have to turn head to left to do so during ambulation  Stairs            Wheelchair Mobility    Modified Rankin (Stroke Patients Only)       Balance Overall balance assessment: Mild deficits observed, not formally tested                                           Pertinent Vitals/Pain Pain Assessment: No/denies pain    Home Living Family/patient expects to be discharged to:: Private residence Living Arrangements: Spouse/significant other Available Help at Discharge: Family;Available 24 hours/day Type of Home: House Home Access: Stairs to enter Entrance Stairs-Rails: None Entrance Stairs-Number of Steps: 1 (threshold) Home Layout: One level Home Equipment: None      Prior Function Level of Independence: Independent         Comments: loves to eat (ice cream - butter peacan)     Hand Dominance   Dominant Hand: Right    Extremity/Trunk Assessment   Upper Extremity Assessment Upper Extremity Assessment: Overall WFL for tasks assessed    Lower Extremity Assessment  Lower Extremity Assessment: Overall WFL for tasks assessed    Cervical / Trunk Assessment Cervical / Trunk Assessment: Normal  Communication   Communication: No difficulties  Cognition Arousal/Alertness: Awake/alert Behavior During Therapy: WFL for tasks assessed/performed Overall Cognitive Status: Impaired/Different from baseline Area of Impairment: Problem solving;Memory                     Memory: (impaired  long-term memory)       Problem Solving: Slow processing General Comments: daughter reports cognition is not baseline. pt is oriented x4 and does recall room number after being told during session      General Comments General comments (skin integrity, edema, etc.): VSS on RA    Exercises     Assessment/Plan    PT Assessment Patient needs continued PT services  PT Problem List Decreased balance;Decreased mobility;Decreased cognition;Decreased knowledge of precautions       PT Treatment Interventions Gait training;Functional mobility training;Therapeutic activities;Balance training;Neuromuscular re-education;Patient/family education    PT Goals (Current goals can be found in the Care Plan section)  Acute Rehab PT Goals Patient Stated Goal: To return to independence PT Goal Formulation: With patient Time For Goal Achievement: 01/28/20 Potential to Achieve Goals: Good Additional Goals Additional Goal #1: Pt will perform trail finding task without bumping into any objects in hallway and without need for redirection or cueing with supervision    Frequency Min 4X/week   Barriers to discharge        Co-evaluation               AM-PAC PT "6 Clicks" Mobility  Outcome Measure Help needed turning from your back to your side while in a flat bed without using bedrails?: None Help needed moving from lying on your back to sitting on the side of a flat bed without using bedrails?: None Help needed moving to and from a bed to a chair (including a wheelchair)?: None Help needed standing up from a chair using your arms (e.g., wheelchair or bedside chair)?: A Little Help needed to walk in hospital room?: A Little Help needed climbing 3-5 steps with a railing? : A Little 6 Click Score: 21    End of Session   Activity Tolerance: Patient tolerated treatment well Patient left: in chair;with call bell/phone within reach;with chair alarm set;with family/visitor present Nurse  Communication: Mobility status PT Visit Diagnosis: Other abnormalities of gait and mobility (R26.89);Other symptoms and signs involving the nervous system (R29.898)    Time: 8563-1497 PT Time Calculation (min) (ACUTE ONLY): 10 min   Charges:   PT Evaluation $PT Eval Moderate Complexity: 1 Mod          Zenaida Niece, PT, DPT Acute Rehabilitation Pager: [phone]   Zenaida Niece 01/14/2020, 2:20 PM

## 2020-01-14 NOTE — ED Notes (Signed)
  Patient told by provider that she will be receiving an MRI after transport to [facility].  Patient states she gets anxious in tight spaces and would like earplugs.  I told the patient that I would pass along this message to receiving RN during report.  [facility] called for report and in route to pick up patient.

## 2020-01-14 NOTE — H&P (Signed)
History and Physical    Stacy Flores CWC:[id number] DOB: 1956-06-18 DOA: 01/13/2020  PCP: Patient, No Pcp Per   Patient coming from: Home   Chief Complaint: Left arm numbness and twitching   HPI: Stacy Flores is a 64 y.o. 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 Patient reported that she been in her usual state of health until approximately 12:30 PM on 01/13/2020 when she developed tingling and numbness involving the left arm beginning distally and soon involving the entire extremity.  She also developed some uncontrolled twitching of the left arm.  Patient's daughter reported that the patient appeared to have difficulty with ambulation.  There was no change in speech and no facial droop noted.  Patient reportedly had some left arm motor deficits with her ICH that have since resolved but continues to have vision deficit.    [facility] ED Course: Upon arrival to the ED, patient is found to be afebrile, saturating well on room air, and with normal blood pressure.  EKG features sinus rhythm and noncontrast head CT is negative for acute findings.  Chemistry panel and CBC are unremarkable, UDS and ethanol level are negative, urinalysis with low specific gravity, and COVID-19 screening test was negative.  Patient was evaluated by teleneurologist who was concerned for partial seizure and/or acute CVA and recommended IV Ativan, Keppra, and transfer for further work-up.  The ED PA discussed the case with the neurologist on-call at [facility] agreed with medical admission to [facility].  Review of Systems:  All other systems reviewed and apart from HPI, are negative.  Past Medical History:  Diagnosis Date  . Hypertension   . Stroke [facility])     Past Surgical History:  Procedure Laterality Date   . ABDOMINAL HYSTERECTOMY    . TONSILLECTOMY       reports that she has never smoked. She has never used smokeless tobacco. She reports current alcohol use. She reports that she does not use drugs.  No Known Allergies  History reviewed. No pertinent family history.   Prior to Admission medications   Medication Sig Start Date End Date Taking? Authorizing Provider  amLODipine (NORVASC) 5 MG tablet Take by mouth. 12/18/16   [provider]    Physical Exam: Vitals:   01/13/20 2230 01/13/20 2352 01/14/20 0000 01/14/20 0125  BP: 123/80 121/63 114/70 135/70  Pulse: 77 92 92 72  Resp: 14 (!) 21 18 13   Temp:   98.1 F (36.7 C) 98 F (36.7 C)  TempSrc:   Oral Oral  SpO2: 97% 99% 99% 98%  Weight:      Height:        Constitutional: NAD, calm  Eyes: PERTLA, lids and conjunctivae normal ENMT: Mucous membranes are moist. Posterior pharynx clear of any exudate or lesions.   Neck: normal, supple, no masses, no thyromegaly Respiratory:  no wheezing, no crackles. No accessory muscle use.  Cardiovascular: S1 & S2 heard, regular rate and rhythm. No extremity edema.   Abdomen: No distension, no tenderness, soft. Bowel sounds active.  Musculoskeletal: no clubbing / cyanosis. No joint deformity upper and lower extremities.   Skin: no significant rashes, lesions, ulcers. Warm, dry, well-perfused. Neurologic: CN 2-12 grossly intact. Sensation to light touch intact. Strength 5/5 in all 4 limbs.  Psychiatric: Sleeping, wakes to loud voice and is oriented to person,  place, and situation but quickly falls back asleep between questions.    Labs and Imaging on Admission: I have personally reviewed following labs and imaging studies  CBC: Recent Labs  Lab 01/13/20 1620  WBC 6.8  NEUTROABS 3.7  HGB 13.2  HCT 40.6  MCV 88.5  PLT 284   Basic Metabolic Panel: Recent Labs  Lab 01/13/20 1620  NA 137  K 3.6  CL 100  CO2 25  GLUCOSE 104*  BUN 13  CREATININE 0.74  CALCIUM 9.4    GFR: Estimated Creatinine Clearance: 70.1 mL/min (by C-G formula based on SCr of 0.74 mg/dL). Liver Function Tests: Recent Labs  Lab 01/13/20 1620  AST 21  ALT 21  ALKPHOS 55  BILITOT 0.5  PROT 8.0  ALBUMIN 4.5   No results for input(s): LIPASE, AMYLASE in the last 168 hours. No results for input(s): AMMONIA in the last 168 hours. Coagulation Profile: Recent Labs  Lab 01/13/20 1620  INR 1.0   Cardiac Enzymes: No results for input(s): CKTOTAL, CKMB, CKMBINDEX, TROPONINI in the last 168 hours. BNP (last 3 results) No results for input(s): PROBNP in the last 8760 hours. HbA1C: No results for input(s): HGBA1C in the last 72 hours. CBG: Recent Labs  Lab 01/13/20 1601  GLUCAP 99   Lipid Profile: No results for input(s): CHOL, HDL, LDLCALC, TRIG, CHOLHDL, LDLDIRECT in the last 72 hours. Thyroid Function Tests: No results for input(s): TSH, T4TOTAL, FREET4, T3FREE, THYROIDAB in the last 72 hours. Anemia Panel: No results for input(s): VITAMINB12, FOLATE, FERRITIN, TIBC, IRON, RETICCTPCT in the last 72 hours. Urine analysis:    Component Value Date/Time   COLORURINE YELLOW 01/13/2020 1850   APPEARANCEUR CLEAR 01/13/2020 1850   LABSPEC <1.005 (L) 01/13/2020 1850   PHURINE 6.0 01/13/2020 1850   GLUCOSEU NEGATIVE 01/13/2020 1850   HGBUR NEGATIVE 01/13/2020 1850   BILIRUBINUR NEGATIVE 01/13/2020 [place] 01/13/2020 1850   PROTEINUR NEGATIVE 01/13/2020 1850   NITRITE NEGATIVE 01/13/2020 1850   LEUKOCYTESUR NEGATIVE 01/13/2020 1850   Sepsis Labs: @LABRCNTIP (procalcitonin:4,lacticidven:4) ) Recent Results (from the past 240 hour(s))  SARS Coronavirus 2 by RT PCR (hospital order, performed in [place] hospital lab) Nasopharyngeal Nasopharyngeal Swab     Status: None   Collection Time: 01/13/20  5:11 PM   Specimen: Nasopharyngeal Swab  Result Value Ref Range Status   SARS Coronavirus 2 NEGATIVE NEGATIVE Final    Comment: (NOTE) SARS-CoV-2 target  nucleic acids are NOT DETECTED. The SARS-CoV-2 RNA is generally detectable in upper and lower respiratory specimens during the acute phase of infection. The lowest concentration of SARS-CoV-2 viral copies this assay can detect is 250 copies / mL. A negative result does not preclude SARS-CoV-2 infection and should not be used as the sole basis for treatment or other patient management decisions.  A negative result may occur with improper specimen collection / handling, submission of specimen other than nasopharyngeal swab, presence of viral mutation(s) within the areas targeted by this assay, and inadequate number of viral copies (<250 copies / mL). A negative result must be combined with clinical observations, patient history, and epidemiological information. Fact Sheet for Patients:   StrictlyIdeas.no Fact Sheet for Healthcare Providers: BankingDealers.co.za This test is not yet approved or cleared  by the [place] FDA and has been authorized for detection and/or diagnosis of SARS-CoV-2 by FDA under an Emergency Use Authorization (EUA).  This EUA will remain in effect (meaning this test can be used) for the duration of the COVID-19  declaration under Section 564(b)(1) of the Act, 21 U.S.C. section 360bbb-3(b)(1), unless the authorization is terminated or revoked sooner. Performed at [facility], [address] [place], [place] [address]      Radiological Exams on Admission: CT HEAD CODE STROKE WO CONTRAST  Result Date: 01/13/2020 CLINICAL DATA:  Code stroke.  Left arm numbness EXAM: CT HEAD WITHOUT CONTRAST TECHNIQUE: Contiguous axial images were obtained from the base of the skull through the vertex without intravenous contrast. COMPARISON:  11/15/2019 FINDINGS: Brain: There is no acute intracranial hemorrhage. Subcentimeter focus of hyperdensity in the region of encephalomalacia secondary to prior right parietal  hemorrhage likely reflects mineralization. Questionable small areas of loss of gray-white differentiation in the left MCA and PCA territories, which are not consistent with reported history and may be on a technical basis. Ventricles are normal in size. Additional patchy and confluent areas hypoattenuation in the supratentorial white matter nonspecific probably reflect stable chronic microvascular ischemic changes. Vascular: No hyperdense vessel. Skull: Unremarkable. Sinuses/Orbits: Right posterior ethmoid focal opacification. Orbits are unremarkable. Other: Mastoid air cells are clear. IMPRESSION: No acute intracranial hemorrhage or definite evidence of acute infarction. Questionable small areas of loss of gray-white differentiation in left MCA and PCA territories are not consistent with reported history and probably on a technical basis. Electronically Signed   By: Guadlupe Spanish M.D.   On: 01/13/2020 16:35    EKG: Independently reviewed. Sinus rhythm.   Assessment/Plan   1. Left arm numbness, weakness, and twitching  - Presents with left arm numbness, weakness, and twitching, had no acute findings on head CT and unremarkable basic labs, was treated with IV Ativan and Keppra for possible seizure, and reports feeling back to baseline by time of arrival at [facility]  - tPA not given d/t hx of ICH  - She passed swallow eval  - Neurology is consulting and much appreciated  - Continue cardiac monitoring, frequent neuro checks, therapy assessments  - Check MRI brain, MRA head and neck, echocardiogram, A1c, lipids, EEG, and continue Keppra pending further recommendations from neurology    2. Hypertension  - BP at goal - Teleneurology recommends permitting hypertension for now but treating if SBP >185 or DBP >110  - Hold Norvasc and use labetalol as needed for now    DVT prophylaxis: Lovenox  Code Status: Full  Family Communication: Discussed with patient  Disposition Plan:  Patient is from: Home    Anticipated d/c is to: Home  Anticipated d/c date is: 01/15/20 Patient currently: Pending MRI, MRA, echo, therapy assessments, neurology consultation   Consults called: Neurology  Admission status: Inpatient     Briscoe Deutscher, MD [facility] Pager: See [email or web address]  If 7AM-7PM, please contact the daytime attending [email or web address]  01/14/2020, 2:28 AM

## 2020-01-14 NOTE — Progress Notes (Signed)
PROGRESS NOTE  Stacy Flores SEG:[id number] DOB: 1955-10-26 DOA: 01/13/2020 PCP: Stacy Flores  Brief History   Stacy Flores is a 64 y.o. 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 Patient reported that she been in her usual state of health until approximately 12:30 PM on 01/13/2020 when she developed tingling and numbness involving the left arm beginning distally and soon involving the entire extremity.  She also developed some uncontrolled twitching of the left arm.  Patient's daughter reported that the patient appeared to have difficulty with ambulation.  There was no change in speech and no facial droop noted.  Patient reportedly had some left arm motor deficits with her ICH that have since resolved but continues to have vision deficit.    [facility] ED Course: Upon arrival to the ED, patient is found to be afebrile, saturating well on room air, and with normal blood pressure.  EKG features sinus rhythm and noncontrast head CT is negative for acute findings.  Chemistry panel and CBC are unremarkable, UDS and ethanol level are negative, urinalysis with low specific gravity, and COVID-19 screening test was negative.  Patient was evaluated by teleneurologist who was concerned for partial seizure and/or acute CVA and recommended IV Ativan, Keppra, and transfer for further work-up.  The ED PA discussed the case with the neurologist on-call at [facility] agreed with medical admission to [facility].  Consultants  . Neurology  Procedures  . None  Antibiotics   Anti-infectives (From admission, onward)   None    .  Subjective  The patient is resting comfortably. She states that her left upper extremity is feeling better.  Objective   Vitals:  Vitals:   01/14/20 0741 01/14/20  1223  BP: 98/74 126/80  Pulse: 75 84  Resp: 16   Temp: (!) 97.5 F (36.4 C) 98.7 F (37.1 C)  SpO2: 97% 99%   Exam:  Constitutional:  . The patient is awake, alert, and oriented x 3. No acute distress. Respiratory:  . No increased work of breathing. . No wheezes, rales, or rhonchi . No tactile fremitus Cardiovascular:  . Regular rate and rhythm . No murmurs, ectopy, or gallups. . No lateral PMI. No thrills. Abdomen:  . Abdomen is soft, non-tender, non-distended . No hernias, masses, or organomegaly . Normoactive bowel sounds.  Musculoskeletal:  . No cyanosis, clubbing, or edema Skin:  . No rashes, lesions, ulcers . palpation of skin: no induration or nodules Neurologic:  . CN 2-12 intact . Weakness of left upper extremity has resolved. Still some numbness Flores patient. Psychiatric:  . Mental status o Mood, affect appropriate o Orientation to person, place, time  . judgment and insight appear intact  I have personally reviewed the following:   Today's Data  . Vitals,   Imaging  . MRI Brain . MRA head and neck  Scheduled Meds: . enoxaparin (LOVENOX) injection  40 mg Subcutaneous Q24H  . levETIRAcetam  750 mg Oral BID   Continuous Infusions: . sodium chloride 500 mL (01/13/20 1644)    Principal Problem:   Weakness of left upper extremity Active Problems:   ICH (intracerebral hemorrhage) (HCC)   Essential hypertension   Muscle twitching   LOS: 1 day   A & P  Left arm numbness, weakness, and twitching: Presents with left arm numbness, weakness, and twitching, had  no acute findings on head CT and unremarkable basic labs, was treated with IV Ativan and Keppra for possible seizure, and reports feeling back to baseline by time of arrival at [facility]. MRI Brain, MRA of head and neck demonstrates presence and evolution of area of prior intraparenchymal hemorrhages with chronic micro hemorrhages in the bilateral cerebral and cerebellar hemispheres. This is most  pronounced on the right temporal occipital region consistent with the patient's known diagnosis of cerebral amyloid angiopathy. No acute hemorrhage or acute infarct. Echocardiogram has been cancelled by neurology. They do not see a role for stroke work up. I appreciate their assistance. EEG is pending. Continue antiepileptics. Besides seizure associated with encephalomalacia from prior ICH, amyloid spell is in the differential. She is on seizure prophylaxis.  Cerebral amyloid angiopathy: Noted. Possible cause of the patient's presenting symptoms includes amyloid spell. Cause of prior intracerebral hemorrhage. I appreciate neurology's assistance.   Hypertension: Blood pressures are well controlled without antihypertensives at this time. Monitor and restart antihypertensives as necessary. As needed labetalol in place. Pt is on Norvasc at home. This is currently held.   DVT prophylaxis: Lovenox  Code Status: Full  Family Communication: Discussed with patient  Disposition Plan:  Patient is from: Home  Anticipated d/c is to: Home  Anticipated d/c date is: 01/15/20 Patient currently: Pending EEG and therapy assessments  Graycen Sadlon, DO [facility] Direct contact: see [email or web address]  7PM-7AM contact night coverage as above 01/14/2020, 3:14 PM  LOS: 1 day

## 2020-01-14 NOTE — Progress Notes (Signed)
0145: Called daughter Claudia Desanctis) to notify that patient has been settled into room. Visitation hours and room number given.   7412: Paged on-call provider to notify that pt has returned to unit from MRI.

## 2020-01-15 MED ORDER — LEVETIRACETAM 750 MG PO TABS: 750.0000 mg | ORAL_TABLET | Freq: Two times a day (BID) | ORAL | 0 refills | Status: DC

## 2020-01-15 NOTE — Plan of Care (Signed)

## 2020-01-15 NOTE — Progress Notes (Addendum)
NEURO HOSPITALIST PROGRESS NOTE   Subjective: Patient in chair eating lunch, ready to go home, NAD. Family at bedside and on face time.  RN reported that patient was having hallucinations overnight. In talking to family these hallucinations are not new and her out patient neurologist is aware, patient being followed by outpatient psych.   Seizure precautions as mentioned below were given to patient and family. They both verbalize an understanding, and patient agrees to not drive until cleared by outpatient neurology.  Exam: Vitals:   01/15/20 0725 01/15/20 1142  BP: 138/80 112/77  Pulse: 67 75  Resp: 11 16  Temp: 97.6 F (36.4 C) 97.7 F (36.5 C)  SpO2: 99% 98%    Physical Exam  Constitutional: Appears well-developed and well-nourished.  Psych: Affect appropriate to situation Eyes: Normal external eye and conjunctiva. HENT: Normocephalic, no lesions, without obvious abnormality.  Musculoskeletal-no joint tenderness, deformity or swelling Cardiovascular: Normal rate and regular rhythm.  Respiratory: Effort normal, non-labored breathing saturations WNL GI: Soft.  No distension. There is no tenderness.  Skin: WDI  Neuro:  Mental Status: Alert, oriented, thought content appropriate.  Speech fluent without evidence of aphasia.  Able to follow commands without difficulty. Cranial Nerves: II: Left hemaniopsia. PERRL III,IV, VI: ptosis not present, disconjugate gaze, EOM full bilaterally V,VII: smile symmetric, facial light touch sensation normal bilaterally VIII: hearing normal bilaterally IX,X: uvula rises symmetrically XI: bilateral shoulder shrug XII: midline tongue extension Motor: Right : Upper extremity   5/5  Left:     Upper extremity   5/5  Lower extremity   5/5   Lower extremity   5/5 Tone and bulk:normal tone throughout; no atrophy noted Sensory: light touch intact throughout, bilaterally Deep Tendon Reflexes: 2+ and symmetric biceps,  patellae Cerebellar: No ataxia noted Gait: Deferred    Medications:  Scheduled:  enoxaparin (LOVENOX) injection  40 mg Subcutaneous Q24H   levETIRAcetam  750 mg Oral BID   Continuous:  sodium chloride 500 mL (01/13/20 1644)   XHB:ZJIRCV chloride, acetaminophen **OR** acetaminophen (TYLENOL) oral liquid 160 mg/5 mL **OR** acetaminophen, labetalol, senna-docusate  Pertinent Labs/Diagnostics:   EEG  Result Date: 01/14/2020 Charlsie Quest, MD     01/14/2020  7:28 PM Patient Name: Stacy Flores MRN: [id number] Epilepsy Attending: Charlsie Quest Referring Physician/Provider: Dr Odie Sera Date: 01/14/2020 Duration: 25.09 mins Patient history: 64 year old with 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 EEG to evaluate for seizure. Level of alertness: Awake, drowsy, sleep, comatose, lethargic AEDs during EEG study: LEV, ativan Technical aspects: This EEG study was done with scalp electrodes positioned according to the 10-20 International system of electrode placement. Electrical activity was acquired at a sampling rate of 500Hz  and reviewed with a high frequency filter of 70Hz  and a low frequency filter of 1Hz . EEG data were recorded continuously and digitally stored. Description: The posterior dominant rhythm consists of 9 Hz activity of moderate voltage (25-35 uV) seen predominantly in posterior head regions, asymmetric ( R<L) and reactive to eye opening and eye closing. Sleep was characterized by vertex waves, sleep spindles (12 to 14 Hz), maximal frontocentral region. EEG showed continuous 3 to 6 Hz theta-delta slowing in right hemisphere, maximal right temporo-parietal region. Broadly distributed  sharp transient was also seen in right hemisphere.  Physiology  photic driving was not seen during photic stimulation.  Hyperventilation was not performed.   ABNORMALITY -Continued slow, right hemisphere, maximal right temporo-parietal region - Background asymmetry, Right <Left IMPRESSION: This study is suggestive of cortical dysfunction in right hemisphere, maximal right temporo-parietal region likely secondary to underlying structural abnormality. No seizures or definite epileptiform discharges were seen throughout the recording. Priyanka Annabelle Harman   MR ANGIO HEAD WO CONTRAST  Result Date: 01/14/2020 CLINICAL DATA:  Initial evaluation for acute confusion, possible stroke. EXAM: MRI HEAD WITHOUT CONTRAST MRA HEAD WITHOUT CONTRAST MRA NECK WITHOUT CONTRAST TECHNIQUE: Multiplanar, multiecho pulse sequences of the brain and surrounding structures were obtained without intravenous contrast. Angiographic images of the Circle of Willis were obtained using MRA technique without intravenous contrast. Angiographic images of the neck were obtained using MRA technique without intravenous contrast. Carotid stenosis measurements (when applicable) are obtained utilizing NASCET criteria, using the distal internal carotid diameter as the denominator. COMPARISON:  Prior head CT from 01/13/2020. FINDINGS: MRI HEAD FINDINGS Brain: Examination moderately degraded by motion artifact. Generalized age-related cerebral atrophy with moderate chronic microvascular ischemic disease, stable from previous. There has been interval evolution of previously seen intraparenchymal hemorrhage positioned at the right parietal lobe, now chronic in appearance. Innumerable scattered chronic micro hemorrhages again seen involving the bilateral cerebral and cerebellar hemispheres, most pronounced at the right temporal occipital region, most suggestive of underlying cerebral amyloid angiopathy. No definite superimposed acute hemorrhage. No abnormal foci of restricted diffusion to suggest acute or subacute ischemia.  Gray-white matter differentiation maintained. No other areas of chronic cortical infarction. No mass lesion, mass effect, or midline shift. No hydrocephalus or extra-axial fluid collection. Pituitary gland suprasellar region normal. Midline structures intact. Vascular: Major intracranial vascular flow voids are maintained. Skull and upper cervical spine: Craniocervical junction within normal limits. Bone marrow signal intensity normal. No scalp soft tissue abnormality. Sinuses/Orbits: Globes and orbital soft tissues within normal limits. Paranasal sinuses are clear. Small right mastoid effusion noted, of doubtful significance. Other: None. MRA HEAD FINDINGS ANTERIOR CIRCULATION: Examination degraded by motion artifact. Distal cervical segments of the internal carotid arteries are widely patent with symmetric antegrade flow. Petrous, cavernous, and supraclinoid ICAs patent without definite stenosis, although evaluation mildly limited by motion. Widely patent left A1 segment. Right A1 hypoplastic and/or absent, accounting for the diminutive right ICA is compared to the left. Normal anterior communicating artery complex. Anterior cerebral arteries patent to their distal aspects without stenosis. No M1 stenosis or occlusion. Normal MCA bifurcations. Distal MCA branches well perfused and symmetric. POSTERIOR CIRCULATION: Both vertebral arteries widely patent to the vertebrobasilar junction without stenosis. Both picas patent. Basilar widely patent to its distal aspect without stenosis. Superior cerebellar and posterior cerebral arteries widely patent bilaterally. No intracranial aneurysm. MRA NECK FINDINGS Examination severely limited due to extensive motion artifact and lack of IV contrast. AORTIC ARCH: Partially visualized aortic arch normal in caliber with normal branch pattern. No definite flow-limiting stenosis about the origin of the great vessels. RIGHT CAROTID SYSTEM: Right common carotid artery patent to the  bifurcation without definite stenosis. No obvious hemodynamically significant stenosis about the right bifurcation, although evaluation limited. Right ICA patent distally to the skull base without stenosis or occlusion. LEFT CAROTID SYSTEM: Left common carotid artery patent to the bifurcation without appreciable stenosis. No obvious flow-limiting stenosis about the left bifurcation. Left ICA patent distally to the skull base without stenosis or occlusion. VERTEBRAL ARTERIES: Vertebral arteries are  poorly assessed proximally. Visualized portions of the vertebral arteries widely patent without stenosis or occlusion. IMPRESSION: MRI HEAD IMPRESSION: 1. Technically limited exam due to motion. 2. No acute intracranial infarct or other abnormality. 3. Interval evolution of prior right parietal intraparenchymal hemorrhage, now chronic in appearance. Innumerable scattered chronic micro hemorrhages throughout the supratentorial brain, most suggestive of underlying cerebral amyloid angiopathy. 4. Underlying age-related cerebral atrophy with moderate chronic microvascular ischemic disease. MRA HEAD IMPRESSION: 1. Technically limited exam due to motion. 2. Grossly stable and negative intracranial MRA. No large vessel occlusion, hemodynamically significant stenosis, or other vascular abnormality. MRA NECK IMPRESSION: 1. Severely limited exam due to motion artifact and lack of IV contrast. 2. Grossly negative MRA of the neck, with patency of both carotid artery systems and vertebral arteries. No hemodynamically significant stenosis identified. Electronically Signed   By: Jeannine Boga M.D.   On: 01/14/2020 06:19   MR ANGIO NECK WO CONTRAST  Result Date: 01/14/2020 CLINICAL DATA:  Initial evaluation for acute confusion, possible stroke. EXAM: MRI HEAD WITHOUT CONTRAST MRA HEAD WITHOUT CONTRAST MRA NECK WITHOUT CONTRAST TECHNIQUE: Multiplanar, multiecho pulse sequences of the brain and surrounding structures were obtained  without intravenous contrast. Angiographic images of the Circle of Willis were obtained using MRA technique without intravenous contrast. Angiographic images of the neck were obtained using MRA technique without intravenous contrast. Carotid stenosis measurements (when applicable) are obtained utilizing NASCET criteria, using the distal internal carotid diameter as the denominator. COMPARISON:  Prior head CT from 01/13/2020. FINDINGS: MRI HEAD FINDINGS Brain: Examination moderately degraded by motion artifact. Generalized age-related cerebral atrophy with moderate chronic microvascular ischemic disease, stable from previous. There has been interval evolution of previously seen intraparenchymal hemorrhage positioned at the right parietal lobe, now chronic in appearance. Innumerable scattered chronic micro hemorrhages again seen involving the bilateral cerebral and cerebellar hemispheres, most pronounced at the right temporal occipital region, most suggestive of underlying cerebral amyloid angiopathy. No definite superimposed acute hemorrhage. No abnormal foci of restricted diffusion to suggest acute or subacute ischemia. Gray-white matter differentiation maintained. No other areas of chronic cortical infarction. No mass lesion, mass effect, or midline shift. No hydrocephalus or extra-axial fluid collection. Pituitary gland suprasellar region normal. Midline structures intact. Vascular: Major intracranial vascular flow voids are maintained. Skull and upper cervical spine: Craniocervical junction within normal limits. Bone marrow signal intensity normal. No scalp soft tissue abnormality. Sinuses/Orbits: Globes and orbital soft tissues within normal limits. Paranasal sinuses are clear. Small right mastoid effusion noted, of doubtful significance. Other: None. MRA HEAD FINDINGS ANTERIOR CIRCULATION: Examination degraded by motion artifact. Distal cervical segments of the internal carotid arteries are widely patent with  symmetric antegrade flow. Petrous, cavernous, and supraclinoid ICAs patent without definite stenosis, although evaluation mildly limited by motion. Widely patent left A1 segment. Right A1 hypoplastic and/or absent, accounting for the diminutive right ICA is compared to the left. Normal anterior communicating artery complex. Anterior cerebral arteries patent to their distal aspects without stenosis. No M1 stenosis or occlusion. Normal MCA bifurcations. Distal MCA branches well perfused and symmetric. POSTERIOR CIRCULATION: Both vertebral arteries widely patent to the vertebrobasilar junction without stenosis. Both picas patent. Basilar widely patent to its distal aspect without stenosis. Superior cerebellar and posterior cerebral arteries widely patent bilaterally. No intracranial aneurysm. MRA NECK FINDINGS Examination severely limited due to extensive motion artifact and lack of IV contrast. AORTIC ARCH: Partially visualized aortic arch normal in caliber with normal branch pattern. No definite flow-limiting stenosis about the origin of the  great vessels. RIGHT CAROTID SYSTEM: Right common carotid artery patent to the bifurcation without definite stenosis. No obvious hemodynamically significant stenosis about the right bifurcation, although evaluation limited. Right ICA patent distally to the skull base without stenosis or occlusion. LEFT CAROTID SYSTEM: Left common carotid artery patent to the bifurcation without appreciable stenosis. No obvious flow-limiting stenosis about the left bifurcation. Left ICA patent distally to the skull base without stenosis or occlusion. VERTEBRAL ARTERIES: Vertebral arteries are poorly assessed proximally. Visualized portions of the vertebral arteries widely patent without stenosis or occlusion. IMPRESSION: MRI HEAD IMPRESSION: 1. Technically limited exam due to motion. 2. No acute intracranial infarct or other abnormality. 3. Interval evolution of prior right parietal  intraparenchymal hemorrhage, now chronic in appearance. Innumerable scattered chronic micro hemorrhages throughout the supratentorial brain, most suggestive of underlying cerebral amyloid angiopathy. 4. Underlying age-related cerebral atrophy with moderate chronic microvascular ischemic disease. MRA HEAD IMPRESSION: 1. Technically limited exam due to motion. 2. Grossly stable and negative intracranial MRA. No large vessel occlusion, hemodynamically significant stenosis, or other vascular abnormality. MRA NECK IMPRESSION: 1. Severely limited exam due to motion artifact and lack of IV contrast. 2. Grossly negative MRA of the neck, with patency of both carotid artery systems and vertebral arteries. No hemodynamically significant stenosis identified. Electronically Signed   By: Rise MuBenjamin  McClintock M.D.   On: 01/14/2020 06:19   MR BRAIN WO CONTRAST  Result Date: 01/14/2020 CLINICAL DATA:  Initial evaluation for acute confusion, possible stroke. EXAM: MRI HEAD WITHOUT CONTRAST MRA HEAD WITHOUT CONTRAST MRA NECK WITHOUT CONTRAST TECHNIQUE: Multiplanar, multiecho pulse sequences of the brain and surrounding structures were obtained without intravenous contrast. Angiographic images of the Circle of Willis were obtained using MRA technique without intravenous contrast. Angiographic images of the neck were obtained using MRA technique without intravenous contrast. Carotid stenosis measurements (when applicable) are obtained utilizing NASCET criteria, using the distal internal carotid diameter as the denominator. COMPARISON:  Prior head CT from 01/13/2020. FINDINGS: MRI HEAD FINDINGS Brain: Examination moderately degraded by motion artifact. Generalized age-related cerebral atrophy with moderate chronic microvascular ischemic disease, stable from previous. There has been interval evolution of previously seen intraparenchymal hemorrhage positioned at the right parietal lobe, now chronic in appearance. Innumerable scattered  chronic micro hemorrhages again seen involving the bilateral cerebral and cerebellar hemispheres, most pronounced at the right temporal occipital region, most suggestive of underlying cerebral amyloid angiopathy. No definite superimposed acute hemorrhage. No abnormal foci of restricted diffusion to suggest acute or subacute ischemia. Gray-white matter differentiation maintained. No other areas of chronic cortical infarction. No mass lesion, mass effect, or midline shift. No hydrocephalus or extra-axial fluid collection. Pituitary gland suprasellar region normal. Midline structures intact. Vascular: Major intracranial vascular flow voids are maintained. Skull and upper cervical spine: Craniocervical junction within normal limits. Bone marrow signal intensity normal. No scalp soft tissue abnormality. Sinuses/Orbits: Globes and orbital soft tissues within normal limits. Paranasal sinuses are clear. Small right mastoid effusion noted, of doubtful significance. Other: None. MRA HEAD FINDINGS ANTERIOR CIRCULATION: Examination degraded by motion artifact. Distal cervical segments of the internal carotid arteries are widely patent with symmetric antegrade flow. Petrous, cavernous, and supraclinoid ICAs patent without definite stenosis, although evaluation mildly limited by motion. Widely patent left A1 segment. Right A1 hypoplastic and/or absent, accounting for the diminutive right ICA is compared to the left. Normal anterior communicating artery complex. Anterior cerebral arteries patent to their distal aspects without stenosis. No M1 stenosis or occlusion. Normal MCA bifurcations. Distal MCA branches well  perfused and symmetric. POSTERIOR CIRCULATION: Both vertebral arteries widely patent to the vertebrobasilar junction without stenosis. Both picas patent. Basilar widely patent to its distal aspect without stenosis. Superior cerebellar and posterior cerebral arteries widely patent bilaterally. No intracranial aneurysm.  MRA NECK FINDINGS Examination severely limited due to extensive motion artifact and lack of IV contrast. AORTIC ARCH: Partially visualized aortic arch normal in caliber with normal branch pattern. No definite flow-limiting stenosis about the origin of the great vessels. RIGHT CAROTID SYSTEM: Right common carotid artery patent to the bifurcation without definite stenosis. No obvious hemodynamically significant stenosis about the right bifurcation, although evaluation limited. Right ICA patent distally to the skull base without stenosis or occlusion. LEFT CAROTID SYSTEM: Left common carotid artery patent to the bifurcation without appreciable stenosis. No obvious flow-limiting stenosis about the left bifurcation. Left ICA patent distally to the skull base without stenosis or occlusion. VERTEBRAL ARTERIES: Vertebral arteries are poorly assessed proximally. Visualized portions of the vertebral arteries widely patent without stenosis or occlusion. IMPRESSION: MRI HEAD IMPRESSION: 1. Technically limited exam due to motion. 2. No acute intracranial infarct or other abnormality. 3. Interval evolution of prior right parietal intraparenchymal hemorrhage, now chronic in appearance. Innumerable scattered chronic micro hemorrhages throughout the supratentorial brain, most suggestive of underlying cerebral amyloid angiopathy. 4. Underlying age-related cerebral atrophy with moderate chronic microvascular ischemic disease. MRA HEAD IMPRESSION: 1. Technically limited exam due to motion. 2. Grossly stable and negative intracranial MRA. No large vessel occlusion, hemodynamically significant stenosis, or other vascular abnormality. MRA NECK IMPRESSION: 1. Severely limited exam due to motion artifact and lack of IV contrast. 2. Grossly negative MRA of the neck, with patency of both carotid artery systems and vertebral arteries. No hemodynamically significant stenosis identified. Electronically Signed   By: Rise Mu M.D.    On: 01/14/2020 06:19   CT HEAD CODE STROKE WO CONTRAST  Result Date: 01/13/2020 CLINICAL DATA:  Code stroke.  Left arm numbness EXAM: CT HEAD WITHOUT CONTRAST TECHNIQUE: Contiguous axial images were obtained from the base of the skull through the vertex without intravenous contrast. COMPARISON:  11/15/2019 FINDINGS: Brain: There is no acute intracranial hemorrhage. Subcentimeter focus of hyperdensity in the region of encephalomalacia secondary to prior right parietal hemorrhage likely reflects mineralization. Questionable small areas of loss of gray-white differentiation in the left MCA and PCA territories, which are not consistent with reported history and may be on a technical basis. Ventricles are normal in size. Additional patchy and confluent areas hypoattenuation in the supratentorial white matter nonspecific probably reflect stable chronic microvascular ischemic changes. Vascular: No hyperdense vessel. Skull: Unremarkable. Sinuses/Orbits: Right posterior ethmoid focal opacification. Orbits are unremarkable. Other: Mastoid air cells are clear. IMPRESSION: No acute intracranial hemorrhage or definite evidence of acute infarction. Questionable small areas of loss of gray-white differentiation in left MCA and PCA territories are not consistent with reported history and probably on a technical basis. Electronically Signed   By: Guadlupe Spanish M.D.   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 1) EEG: This study is suggestive of cortical dysfunction in right hemisphere, maximal right temporo-parietal region likely secondary to underlying structural abnormality. No seizures or definite epileptiform discharges were  seen throughout the recording. 2) Exam reveals left hemianopsia (known), left eye exotropia.  Impression: -New  onset seizure-likely secondary to epileptogenicity of focal encephalomalacia from the prior ICH involving the right frontoparietal lobe. -Other differentials to consider-amyloid spells -History of ICH secondary to cerebral amyloid angiopathy -Residual left hemianopsia  Recommendations:  -- Continue Keppra 750 mg twice daily -- Patient is ready for discharge from a Neurological standpoint -- Follow up with outpatient Neurology -- Outpatient seizure precautions discussed with the patient: Per [facility] statutes, patients with seizures are not allowed to drive until  they have been seizure-free for six months. Use caution when using heavy equipment or power tools. Avoid working on ladders or at heights. Take showers instead of baths. Ensure the water temperature is not too high on the home water heater. Do not go swimming alone. When caring for infants or small children, sit down when holding, feeding, or changing them to minimize risk of injury to the child in the event you have a seizure. Also, Maintain good sleep hygiene. Avoid alcohol.  Valentina Lucks, MSN, NP-C [facility] [phone]  Electronically signed: Dr. Caryl Pina 01/15/2020, 1:11 PM

## 2020-01-15 NOTE — Care Management (Signed)
Outpatient PT referral to [facility] office sent.

## 2020-01-15 NOTE — Discharge Summary (Signed)
Physician Discharge Summary  Meggin Ola PXT:[id number] DOB: July 29, 1956 DOA: 01/13/2020  PCP: Patient, No Pcp Per  Admit date: 01/13/2020 Discharge date: 01/15/2020  Recommendations for Outpatient Follow-up:  No driving until cleared by outpatient neurology Follow up with PCP in 7-10 days. Outpatient PT Follow up with neurology in 4 weeks. Per [facility] statutes, patients with seizures are not allowed to drive until  they have been seizure-free for six months. Use caution when using heavy equipment or power tools. Avoid working on ladders or at heights. Take showers instead of baths. Ensure the water temperature is not too high on the home water heater. Do not go swimming alone. When caring for infants or small children, sit down when holding, feeding, or changing them to minimize risk of injury to the child in the event you have a seizure. Maintain good sleep hygeine. Avoid alcohol.  Discharge Diagnoses: Principal diagnosis is #1 1. Seizure 2. Left arm weakness with numbness and tingling. 3. Hypertension 4. Cerebral amyloid angiopathy 5. History of CVA 6. History of right fronto-parietal ICH, Chronic visual deficits, 7. Chronic left arm deficits following CVA  Discharge Condition: Fair  Disposition: Home  Diet recommendation: Heart healthy  Filed Weights   01/13/20 1607  Weight: 71.7 kg    History of present illness:   Stacy Flores is a 64 y.o. 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 Patient reported that she been in her usual state of health until approximately 12:30 PM on 01/13/2020 when she developed tingling and numbness involving the left arm beginning distally and soon involving the entire extremity.  She also developed some uncontrolled  twitching of the left arm.  Patient's daughter reported that the patient appeared to have difficulty with ambulation.  There was no change in speech and no facial droop noted.  Patient reportedly had some left arm motor deficits with her ICH that have since resolved but continues to have vision deficit.    [facility] ED Course: Upon arrival to the ED, patient is found to be afebrile, saturating well on room air, and with normal blood pressure.  EKG features sinus rhythm and noncontrast head CT is negative for acute findings.  Chemistry panel and CBC are unremarkable, UDS and ethanol level are negative, urinalysis with low specific gravity, and COVID-19 screening test was negative.  Patient was evaluated by teleneurologist who was concerned for partial seizure and/or acute CVA and recommended IV Ativan, Keppra, and transfer for further work-up.  The ED PA discussed the case with the neurologist on-call at [facility] agreed with medical admission to [facility].  Hospital Course: The patient was admitted to a telemetry bed on seizure precautions. She was started on Keppra. Neurology was consulted. She underwent an EEG which did not demonstrate a source for seizures. MRI brain demonstrated stability of the appearance of area of previous CVA. The patient has had no further seizures. She has been evaluated by PT/OT. Neurology has cleared her for discharge. She will follow up with them by outpatient.  Today's assessment: S: The patient is resting comfortably. No new complaints. O: Vitals:  Vitals:   01/15/20 0725 01/15/20 1142  BP: 138/80 112/77  Pulse: 67 75  Resp: 11 16  Temp: 97.6 F (36.4 C) 97.7 F (36.5 C)  SpO2: 99% 98%   Exam:  Constitutional:  .  The patient is awake, alert, and oriented x 3. No acute distress. Respiratory:  . No increased work of breathing. . No wheezes, rales, or rhonchi . No tactile fremitus Cardiovascular:  . Regular rate and rhythm . No murmurs, ectopy, or gallups. . No  lateral PMI. No thrills. Abdomen:  . Abdomen is soft, non-tender, non-distended . No hernias, masses, or organomegaly . Normoactive bowel sounds.  Musculoskeletal:  . No cyanosis, clubbing, or edema Skin:  . No rashes, lesions, ulcers . palpation of skin: no induration or nodules Neurologic:  . CN 2-12 intact . Sensation all 4 extremities intact Psychiatric:  . Mental status o Mood, affect appropriate o Orientation to person, place, time  . judgment and insight appear intact  Discharge Instructions  Discharge Instructions    Call MD for:   Complete by: As directed    Further seizure activity.   Diet - low sodium heart healthy   Complete by: As directed    Discharge instructions   Complete by: As directed    No driving until cleared by outpatient neurology Follow up with PCP in 7-10 days. Outpatient PT Follow up with neurology in 4 weeks. Per [facility] statutes, patients with seizures are not allowed to drive until  they have been seizure-free for six months. Use caution when using heavy equipment or power tools. Avoid working on ladders or at heights. Take showers instead of baths. Ensure the water temperature is not too high on the home water heater. Do not go swimming alone. When caring for infants or small children, sit down when holding, feeding, or changing them to minimize risk of injury to the child in the event you have a seizure. Maintain good sleep hygeine. Avoid alcohol.   Increase activity slowly   Complete by: As directed      Allergies as of 01/15/2020   No Known Allergies     Medication List    STOP taking these medications   acetaminophen 500 MG tablet Commonly known as: TYLENOL   amLODipine 5 MG tablet Commonly known as: NORVASC     TAKE these medications   levETIRAcetam 750 MG tablet Commonly known as: KEPPRA Take 1 tablet (750 mg total) by mouth 2 (two) times daily.      No Known Allergies  The results of significant diagnostics  from this hospitalization (including imaging, microbiology, ancillary and laboratory) are listed below for reference.    Significant Diagnostic Studies: EEG  Result Date: 01/14/2020 Charlsie Quest, MD     01/14/2020  7:28 PM Patient Name: Graciemae Delisle MRN: [id number] Epilepsy Attending: Charlsie Quest Referring Physician/Provider: Dr Odie Sera Date: 01/14/2020 Duration: 25.09 mins Patient history: 64 year old with 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 EEG to evaluate for seizure. Level of alertness: Awake, drowsy, sleep, comatose, lethargic AEDs during EEG study: LEV, ativan Technical aspects: This EEG study was done with scalp electrodes positioned according to the 10-20 International system of electrode placement. Electrical activity was acquired at a sampling rate of  and reviewed with a high frequency filter of  and a low frequency filter of . EEG data were recorded continuously and digitally stored. Description: The posterior dominant rhythm consists of 9 Hz activity of moderate voltage (25-35 uV) seen predominantly in posterior head regions, asymmetric ( R<L) and reactive to eye  opening and eye closing. Sleep was characterized by vertex waves, sleep spindles (12 to 14 Hz), maximal frontocentral region. EEG showed continuous 3 to 6 Hz theta-delta slowing in right hemisphere, maximal right temporo-parietal region. Broadly distributed sharp transient was also seen in right hemisphere.  Physiology photic driving was not seen during photic stimulation.  Hyperventilation was not performed.   ABNORMALITY -Continued slow, right hemisphere, maximal right temporo-parietal region - Background asymmetry, Right <Left IMPRESSION: This study is suggestive of cortical  dysfunction in right hemisphere, maximal right temporo-parietal region likely secondary to underlying structural abnormality. No seizures or definite epileptiform discharges were seen throughout the recording. Priyanka Annabelle Harman   MR ANGIO HEAD WO CONTRAST  Result Date: 01/14/2020 CLINICAL DATA:  Initial evaluation for acute confusion, possible stroke. EXAM: MRI HEAD WITHOUT CONTRAST MRA HEAD WITHOUT CONTRAST MRA NECK WITHOUT CONTRAST TECHNIQUE: Multiplanar, multiecho pulse sequences of the brain and surrounding structures were obtained without intravenous contrast. Angiographic images of the Circle of Willis were obtained using MRA technique without intravenous contrast. Angiographic images of the neck were obtained using MRA technique without intravenous contrast. Carotid stenosis measurements (when applicable) are obtained utilizing NASCET criteria, using the distal internal carotid diameter as the denominator. COMPARISON:  Prior head CT from 01/13/2020. FINDINGS: MRI HEAD FINDINGS Brain: Examination moderately degraded by motion artifact. Generalized age-related cerebral atrophy with moderate chronic microvascular ischemic disease, stable from previous. There has been interval evolution of previously seen intraparenchymal hemorrhage positioned at the right parietal lobe, now chronic in appearance. Innumerable scattered chronic micro hemorrhages again seen involving the bilateral cerebral and cerebellar hemispheres, most pronounced at the right temporal occipital region, most suggestive of underlying cerebral amyloid angiopathy. No definite superimposed acute hemorrhage. No abnormal foci of restricted diffusion to suggest acute or subacute ischemia. Gray-white matter differentiation maintained. No other areas of chronic cortical infarction. No mass lesion, mass effect, or midline shift. No hydrocephalus or extra-axial fluid collection. Pituitary gland suprasellar region normal. Midline structures intact.  Vascular: Major intracranial vascular flow voids are maintained. Skull and upper cervical spine: Craniocervical junction within normal limits. Bone marrow signal intensity normal. No scalp soft tissue abnormality. Sinuses/Orbits: Globes and orbital soft tissues within normal limits. Paranasal sinuses are clear. Small right mastoid effusion noted, of doubtful significance. Other: None. MRA HEAD FINDINGS ANTERIOR CIRCULATION: Examination degraded by motion artifact. Distal cervical segments of the internal carotid arteries are widely patent with symmetric antegrade flow. Petrous, cavernous, and supraclinoid ICAs patent without definite stenosis, although evaluation mildly limited by motion. Widely patent left A1 segment. Right A1 hypoplastic and/or absent, accounting for the diminutive right ICA is compared to the left. Normal anterior communicating artery complex. Anterior cerebral arteries patent to their distal aspects without stenosis. No M1 stenosis or occlusion. Normal MCA bifurcations. Distal MCA branches well perfused and symmetric. POSTERIOR CIRCULATION: Both vertebral arteries widely patent to the vertebrobasilar junction without stenosis. Both picas patent. Basilar widely patent to its distal aspect without stenosis. Superior cerebellar and posterior cerebral arteries widely patent bilaterally. No intracranial aneurysm. MRA NECK FINDINGS Examination severely limited due to extensive motion artifact and lack of IV contrast. AORTIC ARCH: Partially visualized aortic arch normal in caliber with normal branch pattern. No definite flow-limiting stenosis about the origin of the great vessels. RIGHT CAROTID SYSTEM: Right common carotid artery patent to the bifurcation without definite stenosis. No obvious hemodynamically significant stenosis about the right bifurcation, although evaluation limited. Right ICA patent distally to the skull base without stenosis or occlusion. LEFT CAROTID SYSTEM:  Left common carotid  artery patent to the bifurcation without appreciable stenosis. No obvious flow-limiting stenosis about the left bifurcation. Left ICA patent distally to the skull base without stenosis or occlusion. VERTEBRAL ARTERIES: Vertebral arteries are poorly assessed proximally. Visualized portions of the vertebral arteries widely patent without stenosis or occlusion. IMPRESSION: MRI HEAD IMPRESSION: 1. Technically limited exam due to motion. 2. No acute intracranial infarct or other abnormality. 3. Interval evolution of prior right parietal intraparenchymal hemorrhage, now chronic in appearance. Innumerable scattered chronic micro hemorrhages throughout the supratentorial brain, most suggestive of underlying cerebral amyloid angiopathy. 4. Underlying age-related cerebral atrophy with moderate chronic microvascular ischemic disease. MRA HEAD IMPRESSION: 1. Technically limited exam due to motion. 2. Grossly stable and negative intracranial MRA. No large vessel occlusion, hemodynamically significant stenosis, or other vascular abnormality. MRA NECK IMPRESSION: 1. Severely limited exam due to motion artifact and lack of IV contrast. 2. Grossly negative MRA of the neck, with patency of both carotid artery systems and vertebral arteries. No hemodynamically significant stenosis identified. Electronically Signed   By: Rise Mu M.D.   On: 01/14/2020 06:19   MR ANGIO NECK WO CONTRAST  Result Date: 01/14/2020 CLINICAL DATA:  Initial evaluation for acute confusion, possible stroke. EXAM: MRI HEAD WITHOUT CONTRAST MRA HEAD WITHOUT CONTRAST MRA NECK WITHOUT CONTRAST TECHNIQUE: Multiplanar, multiecho pulse sequences of the brain and surrounding structures were obtained without intravenous contrast. Angiographic images of the Circle of Willis were obtained using MRA technique without intravenous contrast. Angiographic images of the neck were obtained using MRA technique without intravenous contrast. Carotid stenosis  measurements (when applicable) are obtained utilizing NASCET criteria, using the distal internal carotid diameter as the denominator. COMPARISON:  Prior head CT from 01/13/2020. FINDINGS: MRI HEAD FINDINGS Brain: Examination moderately degraded by motion artifact. Generalized age-related cerebral atrophy with moderate chronic microvascular ischemic disease, stable from previous. There has been interval evolution of previously seen intraparenchymal hemorrhage positioned at the right parietal lobe, now chronic in appearance. Innumerable scattered chronic micro hemorrhages again seen involving the bilateral cerebral and cerebellar hemispheres, most pronounced at the right temporal occipital region, most suggestive of underlying cerebral amyloid angiopathy. No definite superimposed acute hemorrhage. No abnormal foci of restricted diffusion to suggest acute or subacute ischemia. Gray-white matter differentiation maintained. No other areas of chronic cortical infarction. No mass lesion, mass effect, or midline shift. No hydrocephalus or extra-axial fluid collection. Pituitary gland suprasellar region normal. Midline structures intact. Vascular: Major intracranial vascular flow voids are maintained. Skull and upper cervical spine: Craniocervical junction within normal limits. Bone marrow signal intensity normal. No scalp soft tissue abnormality. Sinuses/Orbits: Globes and orbital soft tissues within normal limits. Paranasal sinuses are clear. Small right mastoid effusion noted, of doubtful significance. Other: None. MRA HEAD FINDINGS ANTERIOR CIRCULATION: Examination degraded by motion artifact. Distal cervical segments of the internal carotid arteries are widely patent with symmetric antegrade flow. Petrous, cavernous, and supraclinoid ICAs patent without definite stenosis, although evaluation mildly limited by motion. Widely patent left A1 segment. Right A1 hypoplastic and/or absent, accounting for the diminutive right  ICA is compared to the left. Normal anterior communicating artery complex. Anterior cerebral arteries patent to their distal aspects without stenosis. No M1 stenosis or occlusion. Normal MCA bifurcations. Distal MCA branches well perfused and symmetric. POSTERIOR CIRCULATION: Both vertebral arteries widely patent to the vertebrobasilar junction without stenosis. Both picas patent. Basilar widely patent to its distal aspect without stenosis. Superior cerebellar and posterior cerebral arteries widely patent bilaterally. No intracranial aneurysm. MRA  NECK FINDINGS Examination severely limited due to extensive motion artifact and lack of IV contrast. AORTIC ARCH: Partially visualized aortic arch normal in caliber with normal branch pattern. No definite flow-limiting stenosis about the origin of the great vessels. RIGHT CAROTID SYSTEM: Right common carotid artery patent to the bifurcation without definite stenosis. No obvious hemodynamically significant stenosis about the right bifurcation, although evaluation limited. Right ICA patent distally to the skull base without stenosis or occlusion. LEFT CAROTID SYSTEM: Left common carotid artery patent to the bifurcation without appreciable stenosis. No obvious flow-limiting stenosis about the left bifurcation. Left ICA patent distally to the skull base without stenosis or occlusion. VERTEBRAL ARTERIES: Vertebral arteries are poorly assessed proximally. Visualized portions of the vertebral arteries widely patent without stenosis or occlusion. IMPRESSION: MRI HEAD IMPRESSION: 1. Technically limited exam due to motion. 2. No acute intracranial infarct or other abnormality. 3. Interval evolution of prior right parietal intraparenchymal hemorrhage, now chronic in appearance. Innumerable scattered chronic micro hemorrhages throughout the supratentorial brain, most suggestive of underlying cerebral amyloid angiopathy. 4. Underlying age-related cerebral atrophy with moderate chronic  microvascular ischemic disease. MRA HEAD IMPRESSION: 1. Technically limited exam due to motion. 2. Grossly stable and negative intracranial MRA. No large vessel occlusion, hemodynamically significant stenosis, or other vascular abnormality. MRA NECK IMPRESSION: 1. Severely limited exam due to motion artifact and lack of IV contrast. 2. Grossly negative MRA of the neck, with patency of both carotid artery systems and vertebral arteries. No hemodynamically significant stenosis identified. Electronically Signed   By: Rise Mu M.D.   On: 01/14/2020 06:19   MR BRAIN WO CONTRAST  Result Date: 01/14/2020 CLINICAL DATA:  Initial evaluation for acute confusion, possible stroke. EXAM: MRI HEAD WITHOUT CONTRAST MRA HEAD WITHOUT CONTRAST MRA NECK WITHOUT CONTRAST TECHNIQUE: Multiplanar, multiecho pulse sequences of the brain and surrounding structures were obtained without intravenous contrast. Angiographic images of the Circle of Willis were obtained using MRA technique without intravenous contrast. Angiographic images of the neck were obtained using MRA technique without intravenous contrast. Carotid stenosis measurements (when applicable) are obtained utilizing NASCET criteria, using the distal internal carotid diameter as the denominator. COMPARISON:  Prior head CT from 01/13/2020. FINDINGS: MRI HEAD FINDINGS Brain: Examination moderately degraded by motion artifact. Generalized age-related cerebral atrophy with moderate chronic microvascular ischemic disease, stable from previous. There has been interval evolution of previously seen intraparenchymal hemorrhage positioned at the right parietal lobe, now chronic in appearance. Innumerable scattered chronic micro hemorrhages again seen involving the bilateral cerebral and cerebellar hemispheres, most pronounced at the right temporal occipital region, most suggestive of underlying cerebral amyloid angiopathy. No definite superimposed acute hemorrhage. No  abnormal foci of restricted diffusion to suggest acute or subacute ischemia. Gray-white matter differentiation maintained. No other areas of chronic cortical infarction. No mass lesion, mass effect, or midline shift. No hydrocephalus or extra-axial fluid collection. Pituitary gland suprasellar region normal. Midline structures intact. Vascular: Major intracranial vascular flow voids are maintained. Skull and upper cervical spine: Craniocervical junction within normal limits. Bone marrow signal intensity normal. No scalp soft tissue abnormality. Sinuses/Orbits: Globes and orbital soft tissues within normal limits. Paranasal sinuses are clear. Small right mastoid effusion noted, of doubtful significance. Other: None. MRA HEAD FINDINGS ANTERIOR CIRCULATION: Examination degraded by motion artifact. Distal cervical segments of the internal carotid arteries are widely patent with symmetric antegrade flow. Petrous, cavernous, and supraclinoid ICAs patent without definite stenosis, although evaluation mildly limited by motion. Widely patent left A1 segment. Right A1 hypoplastic and/or absent, accounting  for the diminutive right ICA is compared to the left. Normal anterior communicating artery complex. Anterior cerebral arteries patent to their distal aspects without stenosis. No M1 stenosis or occlusion. Normal MCA bifurcations. Distal MCA branches well perfused and symmetric. POSTERIOR CIRCULATION: Both vertebral arteries widely patent to the vertebrobasilar junction without stenosis. Both picas patent. Basilar widely patent to its distal aspect without stenosis. Superior cerebellar and posterior cerebral arteries widely patent bilaterally. No intracranial aneurysm. MRA NECK FINDINGS Examination severely limited due to extensive motion artifact and lack of IV contrast. AORTIC ARCH: Partially visualized aortic arch normal in caliber with normal branch pattern. No definite flow-limiting stenosis about the origin of the great  vessels. RIGHT CAROTID SYSTEM: Right common carotid artery patent to the bifurcation without definite stenosis. No obvious hemodynamically significant stenosis about the right bifurcation, although evaluation limited. Right ICA patent distally to the skull base without stenosis or occlusion. LEFT CAROTID SYSTEM: Left common carotid artery patent to the bifurcation without appreciable stenosis. No obvious flow-limiting stenosis about the left bifurcation. Left ICA patent distally to the skull base without stenosis or occlusion. VERTEBRAL ARTERIES: Vertebral arteries are poorly assessed proximally. Visualized portions of the vertebral arteries widely patent without stenosis or occlusion. IMPRESSION: MRI HEAD IMPRESSION: 1. Technically limited exam due to motion. 2. No acute intracranial infarct or other abnormality. 3. Interval evolution of prior right parietal intraparenchymal hemorrhage, now chronic in appearance. Innumerable scattered chronic micro hemorrhages throughout the supratentorial brain, most suggestive of underlying cerebral amyloid angiopathy. 4. Underlying age-related cerebral atrophy with moderate chronic microvascular ischemic disease. MRA HEAD IMPRESSION: 1. Technically limited exam due to motion. 2. Grossly stable and negative intracranial MRA. No large vessel occlusion, hemodynamically significant stenosis, or other vascular abnormality. MRA NECK IMPRESSION: 1. Severely limited exam due to motion artifact and lack of IV contrast. 2. Grossly negative MRA of the neck, with patency of both carotid artery systems and vertebral arteries. No hemodynamically significant stenosis identified. Electronically Signed   By: Jeannine Boga M.D.   On: 01/14/2020 06:19   CT HEAD CODE STROKE WO CONTRAST  Result Date: 01/13/2020 CLINICAL DATA:  Code stroke.  Left arm numbness EXAM: CT HEAD WITHOUT CONTRAST TECHNIQUE: Contiguous axial images were obtained from the base of the skull through the vertex without  intravenous contrast. COMPARISON:  11/15/2019 FINDINGS: Brain: There is no acute intracranial hemorrhage. Subcentimeter focus of hyperdensity in the region of encephalomalacia secondary to prior right parietal hemorrhage likely reflects mineralization. Questionable small areas of loss of gray-white differentiation in the left MCA and PCA territories, which are not consistent with reported history and may be on a technical basis. Ventricles are normal in size. Additional patchy and confluent areas hypoattenuation in the supratentorial white matter nonspecific probably reflect stable chronic microvascular ischemic changes. Vascular: No hyperdense vessel. Skull: Unremarkable. Sinuses/Orbits: Right posterior ethmoid focal opacification. Orbits are unremarkable. Other: Mastoid air cells are clear. IMPRESSION: No acute intracranial hemorrhage or definite evidence of acute infarction. Questionable small areas of loss of gray-white differentiation in left MCA and PCA territories are not consistent with reported history and probably on a technical basis. Electronically Signed   By: Macy Mis M.D.   On: 01/13/2020 16:35    Microbiology: Recent Results (from the past 240 hour(s))  SARS Coronavirus 2 by RT PCR (hospital order, performed in [facility] hospital lab) Nasopharyngeal Nasopharyngeal Swab     Status: None   Collection Time: 01/13/20  5:11 PM   Specimen: Nasopharyngeal Swab  Result Value Ref  Range Status   SARS Coronavirus 2 NEGATIVE NEGATIVE Final    Comment: (NOTE) SARS-CoV-2 target nucleic acids are NOT DETECTED. The SARS-CoV-2 RNA is generally detectable in upper and lower respiratory specimens during the acute phase of infection. The lowest concentration of SARS-CoV-2 viral copies this assay can detect is 250 copies / mL. A negative result does not preclude SARS-CoV-2 infection and should not be used as the sole basis for treatment or other patient management decisions.  A negative result  may occur with improper specimen collection / handling, submission of specimen other than nasopharyngeal swab, presence of viral mutation(s) within the areas targeted by this assay, and inadequate number of viral copies (<250 copies / mL). A negative result must be combined with clinical observations, patient history, and epidemiological information. Fact Sheet for Patients:   BoilerBrush.com.cyhttps://www.fda.gov/media/136312/download Fact Sheet for Healthcare Providers: https://pope.com/https://www.fda.gov/media/136313/download This test is not yet approved or cleared  by the [place] FDA and has been authorized for detection and/or diagnosis of SARS-CoV-2 by FDA under an Emergency Use Authorization (EUA).  This EUA will remain in effect (meaning this test can be used) for the duration of the COVID-19 declaration under Section 564(b)(1) of the Act, 21 U.S.C. section 360bbb-3(b)(1), unless the authorization is terminated or revoked sooner. Performed at [facility] [place], [address] [place], [place] [address]      Labs: Basic Metabolic Panel: Recent Labs  Lab 01/13/20 1620  NA 137  K 3.6  CL 100  CO2 25  GLUCOSE 104*  BUN 13  CREATININE 0.74  CALCIUM 9.4   Liver Function Tests: Recent Labs  Lab 01/13/20 1620  AST 21  ALT 21  ALKPHOS 55  BILITOT 0.5  PROT 8.0  ALBUMIN 4.5   No results for input(s): LIPASE, AMYLASE in the last 168 hours. No results for input(s): AMMONIA in the last 168 hours. CBC: Recent Labs  Lab 01/13/20 1620  WBC 6.8  NEUTROABS 3.7  HGB 13.2  HCT 40.6  MCV 88.5  PLT 243   Cardiac Enzymes: No results for input(s): CKTOTAL, CKMB, CKMBINDEX, TROPONINI in the last 168 hours. BNP: BNP (last 3 results) No results for input(s): BNP in the last 8760 hours.  ProBNP (last 3 results) No results for input(s): PROBNP in the last 8760 hours.  CBG: Recent Labs  Lab 01/13/20 1601  GLUCAP 99    Principal Problem:   Weakness of left upper extremity Active  Problems:   Cerebral amyloid angiopathy (CODE)   Essential hypertension   Muscle twitching   Time coordinating discharge: 38 minutes.  Signed:        Queena Monrreal, DO [facility]  01/15/2020, 3:16 PM

## 2020-02-23 ENCOUNTER — Encounter (HOSPITAL_BASED_OUTPATIENT_CLINIC_OR_DEPARTMENT_OTHER): Payer: Self-pay

## 2020-02-23 ENCOUNTER — Other Ambulatory Visit: Payer: Self-pay

## 2020-02-23 ENCOUNTER — Inpatient Hospital Stay (HOSPITAL_BASED_OUTPATIENT_CLINIC_OR_DEPARTMENT_OTHER)
Admission: EM | Admit: 2020-02-23 | Discharge: 2020-02-26 | DRG: 065 | Disposition: A | Discharge status: Home Health | Payer: Self-pay | Attending: Neurology | Admitting: Neurology

## 2020-02-23 ENCOUNTER — Emergency Department (HOSPITAL_BASED_OUTPATIENT_CLINIC_OR_DEPARTMENT_OTHER): Payer: Self-pay

## 2020-02-23 DIAGNOSIS — Z8673 Personal history of transient ischemic attack (TIA), and cerebral infarction without residual deficits: Secondary | ICD-10-CM

## 2020-02-23 DIAGNOSIS — I680 Cerebral amyloid angiopathy: Secondary | ICD-10-CM | POA: Diagnosis present

## 2020-02-23 DIAGNOSIS — R739 Hyperglycemia, unspecified: Secondary | ICD-10-CM | POA: Diagnosis present

## 2020-02-23 DIAGNOSIS — I629 Nontraumatic intracranial hemorrhage, unspecified: Secondary | ICD-10-CM

## 2020-02-23 DIAGNOSIS — Z79899 Other long term (current) drug therapy: Secondary | ICD-10-CM

## 2020-02-23 DIAGNOSIS — E854 Organ-limited amyloidosis: Secondary | ICD-10-CM | POA: Diagnosis present

## 2020-02-23 DIAGNOSIS — E785 Hyperlipidemia, unspecified: Secondary | ICD-10-CM | POA: Diagnosis present

## 2020-02-23 DIAGNOSIS — I619 Nontraumatic intracerebral hemorrhage, unspecified: Secondary | ICD-10-CM | POA: Diagnosis present

## 2020-02-23 DIAGNOSIS — G8194 Hemiplegia, unspecified affecting left nondominant side: Secondary | ICD-10-CM | POA: Diagnosis present

## 2020-02-23 DIAGNOSIS — H5347 Heteronymous bilateral field defects: Secondary | ICD-10-CM | POA: Diagnosis present

## 2020-02-23 DIAGNOSIS — R29706 NIHSS score 6: Secondary | ICD-10-CM | POA: Diagnosis present

## 2020-02-23 DIAGNOSIS — I10 Essential (primary) hypertension: Secondary | ICD-10-CM | POA: Diagnosis present

## 2020-02-23 DIAGNOSIS — I611 Nontraumatic intracerebral hemorrhage in hemisphere, cortical: Principal | ICD-10-CM | POA: Diagnosis present

## 2020-02-23 DIAGNOSIS — R29810 Facial weakness: Secondary | ICD-10-CM | POA: Diagnosis present

## 2020-02-23 DIAGNOSIS — Z87891 Personal history of nicotine dependence: Secondary | ICD-10-CM

## 2020-02-23 DIAGNOSIS — Z20822 Contact with and (suspected) exposure to covid-19: Secondary | ICD-10-CM | POA: Diagnosis present

## 2020-02-23 DIAGNOSIS — G40909 Epilepsy, unspecified, not intractable, without status epilepticus: Secondary | ICD-10-CM | POA: Diagnosis present

## 2020-02-23 LAB — CBC WITH DIFFERENTIAL/PLATELET
Abs Immature Granulocytes: 0.02 10*3/uL (ref 0.00–0.07)
Basophils Absolute: 0 10*3/uL (ref 0.0–0.1)
Basophils Relative: 0 %
Eosinophils Absolute: 0 10*3/uL (ref 0.0–0.5)
Eosinophils Relative: 0 %
HCT: 42 % (ref 36.0–46.0)
Hemoglobin: 13.7 g/dL (ref 12.0–15.0)
Immature Granulocytes: 0 %
Lymphocytes Relative: 24 %
Lymphs Abs: 1.8 10*3/uL (ref 0.7–4.0)
MCH: 28.4 pg (ref 26.0–34.0)
MCHC: 32.6 g/dL (ref 30.0–36.0)
MCV: 87.1 fL (ref 80.0–100.0)
Monocytes Absolute: 0.6 10*3/uL (ref 0.1–1.0)
Monocytes Relative: 8 %
Neutro Abs: 5 10*3/uL (ref 1.7–7.7)
Neutrophils Relative %: 68 %
Platelets: 269 10*3/uL (ref 150–400)
RBC: 4.82 MIL/uL (ref 3.87–5.11)
RDW: 13.7 % (ref 11.5–15.5)
WBC: 7.5 10*3/uL (ref 4.0–10.5)
nRBC: 0 % (ref 0.0–0.2)

## 2020-02-23 LAB — COMPREHENSIVE METABOLIC PANEL
ALT: 20 U/L (ref 0–44)
AST: 17 U/L (ref 15–41)
Albumin: 4.3 g/dL (ref 3.5–5.0)
Alkaline Phosphatase: 70 U/L (ref 38–126)
Anion gap: 10 (ref 5–15)
BUN: 10 mg/dL (ref 8–23)
CO2: 27 mmol/L (ref 22–32)
Calcium: 9.4 mg/dL (ref 8.9–10.3)
Chloride: 100 mmol/L (ref 98–111)
Creatinine, Ser: 0.71 mg/dL (ref 0.44–1.00)
GFR calc Af Amer: >60 mL/min (ref >60)
GFR calc non Af Amer: >60 mL/min (ref >60)
Glucose, Bld: 119 mg/dL — ABNORMAL HIGH (ref 70–99)
Potassium: 4.5 mmol/L (ref 3.5–5.1)
Sodium: 137 mmol/L (ref 135–145)
Total Bilirubin: 0.5 mg/dL (ref 0.3–1.2)
Total Protein: 8.2 g/dL — ABNORMAL HIGH (ref 6.5–8.1)

## 2020-02-23 LAB — CBG MONITORING, ED: Glucose-Capillary: 106 mg/dL — ABNORMAL HIGH (ref 70–99)

## 2020-02-23 LAB — SARS CORONAVIRUS 2 BY RT PCR (HOSPITAL ORDER, PERFORMED IN ~~LOC~~ HOSPITAL LAB): SARS Coronavirus 2: NEGATIVE

## 2020-02-23 MED ORDER — PANTOPRAZOLE SODIUM 40 MG IV SOLR
40.0000 mg | Freq: Every day | INTRAVENOUS | Status: DC
  Administered 2020-02-24: 40 mg via INTRAVENOUS
  Filled 2020-02-23: qty 40

## 2020-02-23 MED ORDER — ACETAMINOPHEN 325 MG PO TABS
650.0000 mg | ORAL_TABLET | ORAL | Status: DC | PRN
  Administered 2020-02-24 – 2020-02-25 (×2): 650 mg via ORAL
  Filled 2020-02-23 (×4): qty 2

## 2020-02-23 MED ORDER — ACETAMINOPHEN 650 MG RE SUPP: 650.0000 mg | RECTAL | Status: DC | PRN

## 2020-02-23 MED ORDER — STROKE: EARLY STAGES OF RECOVERY BOOK
Freq: Once | Status: AC
  Filled 2020-02-23: qty 1

## 2020-02-23 MED ORDER — SENNOSIDES-DOCUSATE SODIUM 8.6-50 MG PO TABS
1.0000 | ORAL_TABLET | Freq: Two times a day (BID) | ORAL | Status: DC
  Administered 2020-02-24 – 2020-02-26 (×5): 1 via ORAL
  Filled 2020-02-23 (×5): qty 1

## 2020-02-23 MED ORDER — IOHEXOL 350 MG/ML SOLN
75.0000 mL | Freq: Once | INTRAVENOUS | Status: AC | PRN
  Administered 2020-02-23: 100 mL via INTRAVENOUS

## 2020-02-23 MED ORDER — CLEVIDIPINE BUTYRATE 0.5 MG/ML IV EMUL: 0.0000 mg/h | INTRAVENOUS | Status: DC

## 2020-02-23 MED ORDER — ACETAMINOPHEN 500 MG PO TABS
1000.0000 mg | ORAL_TABLET | Freq: Once | ORAL | Status: AC
  Administered 2020-02-23: 1000 mg via ORAL
  Filled 2020-02-23: qty 2

## 2020-02-23 MED ORDER — SODIUM CHLORIDE 0.9 % IV SOLN
750.0000 mg | Freq: Two times a day (BID) | INTRAVENOUS | Status: DC
  Administered 2020-02-24: 750 mg via INTRAVENOUS
  Filled 2020-02-23 (×3): qty 7.5

## 2020-02-23 MED ORDER — CLEVIDIPINE BUTYRATE 0.5 MG/ML IV EMUL
0.0000 mg/h | INTRAVENOUS | Status: DC
  Filled 2020-02-23: qty 50

## 2020-02-23 MED ORDER — ACETAMINOPHEN 160 MG/5ML PO SOLN: 650.0000 mg | ORAL | Status: DC | PRN

## 2020-02-23 MED ORDER — FENTANYL CITRATE (PF) 100 MCG/2ML IJ SOLN
25.0000 ug | Freq: Once | INTRAMUSCULAR | Status: AC
  Administered 2020-02-23: 25 ug via INTRAVENOUS
  Filled 2020-02-23: qty 2

## 2020-02-23 MED ORDER — LABETALOL HCL 5 MG/ML IV SOLN
10.0000 mg | INTRAVENOUS | Status: DC | PRN
  Administered 2020-02-23 (×3): 10 mg via INTRAVENOUS
  Filled 2020-02-23 (×2): qty 4

## 2020-02-23 MED ORDER — CLEVIDIPINE BUTYRATE 0.5 MG/ML IV EMUL
INTRAVENOUS | Status: AC
  Filled 2020-02-23: qty 50

## 2020-02-23 MED ORDER — CHLORHEXIDINE GLUCONATE CLOTH 2 % EX PADS: 6.0000 | MEDICATED_PAD | Freq: Every day | CUTANEOUS | Status: DC

## 2020-02-23 NOTE — Discharge Instructions (Addendum)
DISCHARGE INSTRUCTIONS FOR THE PATIENT 1. Your doctors have recommended that your systolic blood pressure (the top number) be maintained at less than 130. 2. Home therapies have not been recommended at this time due to the significant progress you have made. 3. It is recommended that you have supervision (for safety) initially when you are up out of bed.  4. Gradually increase your activity as tolerated. 5. You are not to have any aspirin or products that contain aspirin. You should avoid Advil, Aleve, Ibuprofen, and similar medications (NSAIDS). Tylenol (acetominophine) may be taken for pain or fever. 6. Your blood sugar was mildly high. Try to avoid sweets. Talk to your primary care provider about further recommendations. 7. Your cholesterol was high but you should not take statin medications because of your history of hemorrhagic stroke. You may need to make changes in your diet. 8. Your Norvasc (amlodipine) medication has been increased from 5 mg daily to 10 mg daily. If you still have 5 mg capsules left you may take two each day, instead of one, until they run out and then switch to the 10 mg strength.

## 2020-02-23 NOTE — ED Triage Notes (Addendum)
Pt c/o HA x 2 days-pain site across forehead-denies injury or hx of HAs-NAD-to triage in w/c

## 2020-02-23 NOTE — ED Notes (Signed)
[facility] notified (Kim) - patient ready for transport 

## 2020-02-23 NOTE — ED Provider Notes (Addendum)
[facility] EMERGENCY DEPARTMENT Provider Note   CSN: [id number] Arrival date & time: 02/23/20  1416     History Chief Complaint  Patient presents with  . Headache    Stacy Flores is a 64 y.o. female.  64 year old female with a history significant for intracranial hemorrhage in December of last year and June of this year presenting with headache and some confusion.  Patient's daughter states that headache started about 2 days ago and the patient was noted to have some confusion at that time, putting her bra on top of her shirt when getting dressed and speaking slower than normal.  Patient's daughter states this is how she presented during her last bout of intracranial hemorrhage.  Patient states that headache is in the bilateral forehead and feels like a pressure, 2/10 in intensity and comes and goes.  Patient alert and oriented to person, place, time, situation.  On additional questioning, per patient's daughter she thinks the patient may have had a seizure last night as she had some twitching of her arms and the confusion was noted after this.  Patient's daughter stated the patient did have a history of seizures and a previous admission due to this.  Patient daughter states that patient just taken Keppra prior to this episode.        Past Medical History:  Diagnosis Date  . Hypertension   . Stroke [facility])     Patient Active Problem List   Diagnosis Date Noted  . Muscle twitching 01/14/2020  . Weakness of left upper extremity 01/13/2020  . Cerebral amyloid angiopathy (CODE) 07/25/2019  . Essential hypertension 07/25/2019  . Hyperlipemia 07/25/2019  . 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 ICH (intracerebral hemorrhage) (HCC) 07/21/2019    Past Surgical History:  Procedure Laterality Date  . ABDOMINAL HYSTERECTOMY    . TONSILLECTOMY       OB History   No obstetric history on file.     No family history on file.  Social  History   Tobacco Use  . Smoking status: Former Games developer  . Smokeless tobacco: Never Used  Vaping Use  . Vaping Use: Never used  Substance Use Topics  . Alcohol use: Not Currently  . Drug use: Never    Home Medications Prior to Admission medications   Medication Sig Start Date End Date Taking? Authorizing Provider  levETIRAcetam (KEPPRA) 750 MG tablet Take 1 tablet (750 mg total) by mouth 2 (two) times daily. 01/15/20   Swayze, Ava, DO    Allergies    Patient has no known allergies.  Review of Systems   Review of Systems  Constitutional: Negative for fever.  Respiratory: Negative for shortness of breath.   Cardiovascular: Negative for chest pain.  Neurological: Positive for headaches. Negative for facial asymmetry.  Psychiatric/Behavioral: Positive for confusion.    Physical Exam Updated Vital Signs BP 140/77   Pulse 90   Temp 99 F (37.2 C) (Oral)   Resp 20   Ht 5\' 3"  (1.6 m)   Wt 72.1 kg   SpO2 100%   BMI 28.17 kg/m   Physical Exam HENT:     Head: Normocephalic and atraumatic.     Mouth/Throat:     Mouth: Mucous membranes are moist.  Eyes:     Extraocular Movements: Extraocular movements intact.     Pupils: Pupils are equal, round, and reactive to light.  Cardiovascular:     Rate and Rhythm: Normal rate and regular rhythm.  Heart sounds: No murmur heard.   Pulmonary:     Effort: Pulmonary effort is normal. No respiratory distress.     Breath sounds: Normal breath sounds. No wheezing or rales.  Abdominal:     General: Bowel sounds are normal.     Palpations: Abdomen is soft.  Musculoskeletal:     Cervical back: Normal range of motion.  Skin:    General: Skin is warm and dry.  Neurological:     Mental Status: She is alert and oriented to person, place, and time.     Cranial Nerves: No cranial nerve deficit or facial asymmetry.     Sensory: No sensory deficit.     Motor: Weakness (4/5 left upper/lower extremity, states this is chronic) present.      ED Results / Procedures / Treatments   Labs (all labs ordered are listed, but only abnormal results are displayed) Labs Reviewed  COMPREHENSIVE METABOLIC PANEL - Abnormal; Notable for the following components:      Result Value   Glucose, Bld 119 (*)    Total Protein 8.2 (*)    All other components within normal limits  CBG MONITORING, ED - Abnormal; Notable for the following components:   Glucose-Capillary 106 (*)    All other components within normal limits  SARS CORONAVIRUS 2 BY RT PCR (HOSPITAL ORDER, PERFORMED IN [facility])  CBC WITH DIFFERENTIAL/PLATELET    EKG None  Radiology CT Head Wo Contrast  Result Date: 02/23/2020 CLINICAL DATA:  Headache, forehead pain for 2 days, near syncope EXAM: CT HEAD WITHOUT CONTRAST TECHNIQUE: Contiguous axial images were obtained from the base of the skull through the vertex without intravenous contrast. COMPARISON:  01/13/2020 FINDINGS: Brain: There is a new area of intraparenchymal hemorrhage within the right parietal convexity, measuring 4.7 x 3.8 cm in transverse direction and extending approximately 4.8 cm in craniocaudal length. There is significant surrounding edema, with effacement of the right lateral ventricle. Basilar cisterns are preserved. There is no midline shift. Vascular: No hyperdense vessel or unexpected calcification. Skull: Normal. Negative for fracture or focal lesion. Sinuses/Orbits: No acute finding. Other: None. IMPRESSION: 1. Large right parietal intraparenchymal hemorrhage measuring 4.8 x 4.7 x 3.8 cm. Significant surrounding edema with effacement of the right lateral ventricle. These results were called by telephone at the time of interpretation on 02/23/2020 at 3:59 pm to provider DAN FLOYD , who verbally acknowledged these results. Electronically Signed   By: Sharlet Salina M.D.   On: 02/23/2020 16:05    Procedures Procedures (including critical care time)  Medications Ordered in ED Medications   labetalol (NORMODYNE) injection 10 mg (has no administration in time range)  iohexol (OMNIPAQUE) 350 MG/ML injection 75 mL (100 mLs Intravenous Contrast Given 02/23/20 1650)    ED Course  I have reviewed the triage vital signs and the nursing notes.  Pertinent labs & imaging results that were available during my care of the patient were reviewed by me and considered in my medical decision making (see chart for details).    MDM Rules/Calculators/A&P                          64 year old female with a history significant for previous admission for seizures and to previous intracranial hemorrhage presenting with a headache for 2 days and some confusion.  Patient's daughter noted that she thinks the patient may have had a seizure prior to the confusion with some twitching of the left arm.  Patient is on Keppra for this.  Will check CBG for hypoglycemia and get CT head to rule out intracranial hemorrhage as the patient has a history of these.  No symptoms concerning at this time for infection, hypoglycemia, electrolyte abnormality. CT results showed large right parietal intraparenchymal hemorrhage measuring 4.8 x 4.7 x 3.8 cm. Neurosurgery consulted. Per neurosurgery will consult stroke service. Consulted stroke service. Neurology recommendation to order CTA head and CTV head, keep pressure under 140 systolic with labetalol PRN.  Dr. Wilford Corner states he will accept the patient to his service and admit to neuro ICU.  Addendum: Due to over blood pressures will order Cleviprex to be started if patient's blood pressures remain greater than 140 systolic.  Provided small dose of fentanyl as well as acetaminophen for pain control.  Final Clinical Impression(s) / ED Diagnoses Final diagnoses:  Intracranial bleed [facility])    Rx / DC Orders ED Discharge Orders    None       Jackelyn Poling, DO 02/23/20 1705    Melene Plan, DO 02/23/20 1916    Jackelyn Poling, DO 02/23/20 2112    Melene Plan, DO 02/23/20  2200

## 2020-02-23 NOTE — H&P (Signed)
Admission H&P    Chief Complaint: Headache x 2 days  HPI: Stacy Flores is an 64 y.o. 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 She also complained of LUE shaking the night before, followed by confusion, that was concerning for seizure. Family felt that they saw some right-sided facial drooping which was new. Regarding the patient's confusion, her daughter noted her to have put her bra on top of her shirt when getting dressed and was speaking slower than normal. Shortly after arriving to [facility], the patient was alert and oriented.   CT head at [facility] revealed a large right parietal intraparenchymal hemorrhage measuring 4.8 x 4.7 x 3.8 cm. There was significant surrounding edema with effacement of the right lateral ventricle.  CTA neck: The common carotid, internal carotid and vertebral arteries are patent within the neck without hemodynamically significant stenosis. Mild atherosclerotic disease as described.  CTA head: 1. No intracranial aneurysm or AVM is identified. Follow-up CT or MR angiography following resolution of the right parietal hematoma can be considered for further evaluation, although no aneurysm or AVM was identified on the prior MRA of 01/14/2020. 2. No intracranial large vessel occlusion or proximal high-grade arterial stenosis.  CT venogram:  No evidence of intracranial venous thrombosis.  MRI brain, 01/14/20: 1. Technically limited exam due to motion. 2. No acute intracranial infarct or other abnormality. 3. Interval evolution of prior right parietal intraparenchymal hemorrhage, now chronic in appearance. Innumerable scattered chronic micro hemorrhages throughout the supratentorial brain, most suggestive of underlying cerebral amyloid angiopathy. 4.  Underlying age-related cerebral atrophy with moderate chronic microvascular ischemic disease.  Home medications include Keppra 750 mg BID.   Past Medical History:  Diagnosis Date  . Hypertension   . Stroke [facility])   Cerebral amyloid angiopathy HLD ICH x 2   Past Surgical History:  Procedure Laterality Date  . ABDOMINAL HYSTERECTOMY    . TONSILLECTOMY      No family history on file. Social History:  reports that she has quit smoking. She has never used smokeless tobacco. She reports previous alcohol use. She reports that she does not use drugs.  Allergies: No Known Allergies  Medications Prior to Admission  Medication Sig Dispense Refill  . levETIRAcetam (KEPPRA) 750 MG tablet Take 1 tablet (750 mg total) by mouth 2 (two) times daily. 60 tablet 0    ROS: Headache is now resolved. Other symptoms as per HPI, with comprehensive ROS otherwise negative.   Physical Examination: Blood pressure 133/72, pulse 77, temperature 99 F (37.2 C), temperature source Oral, resp. rate 20, height 5\' 3"  (1.6 m), weight 72.1 kg, SpO2 99 %.  HEENT-  /AT Lungs - Respirations unlabored Extremities - No edema  Neurologic Examination: Mental Status: Alert, fully oriented, thought content appropriate.  Speech fluent with intact naming and comprehension. Able to follow all commands without difficulty. Cranial Nerves: II:  Left sided visual field cut, upper and lower quadrants. PERRL.  III,IV, VI: No ptosis not present. Exotropia is noted (chronic per patient). EOM are full with no nystagmus seen.  V,VII: Mild left facial droop. Facial temp sensation equal bilaterally VIII: Hearing intact to voice IX,X: No hypophonia XI: Head is midline XII: Midline tongue extension  Motor: RUE and RLE 5/5 LUE 4+/5 proximal and distal LLE 5/5 HF,  KE, ADF and APF. Knee flexion 4/5.  Sensory: Temp and light touch sensation equal to bilateral upper and lower extremities. Positive for extinction on the left.   Deep Tendon Reflexes:  2+ right biceps and brachioradialis.  1+ left biceps and brachioradialis 3+ bilateral patellae Cerebellar: No ataxia with FNF on the right. Sensory ataxia is seen with left FNF Gait: Deferred  Results for orders placed or performed during the hospital encounter of 02/23/20 (from the past 48 hour(s))  CBG monitoring, ED     Status: Abnormal   Collection Time: 02/23/20  3:51 PM  Result Value Ref Range   Glucose-Capillary 106 (H) 70 - 99 mg/dL    Comment: Glucose reference range applies only to samples taken after fasting for at least 8 hours.  CBC with Differential     Status: None   Collection Time: 02/23/20  4:29 PM  Result Value Ref Range   WBC 7.5 4.0 - 10.5 K/uL   RBC 4.82 3.87 - 5.11 MIL/uL   Hemoglobin 13.7 12.0 - 15.0 g/dL   HCT 10.1 36 - 46 %   MCV 87.1 80.0 - 100.0 fL   MCH 28.4 26.0 - 34.0 pg   MCHC 32.6 30.0 - 36.0 g/dL   RDW 75.1 02.5 - 85.2 %   Platelets 269 150 - 400 K/uL   nRBC 0.0 0.0 - 0.2 %   Neutrophils Relative % 68 %   Neutro Abs 5.0 1.7 - 7.7 K/uL   Lymphocytes Relative 24 %   Lymphs Abs 1.8 0.7 - 4.0 K/uL   Monocytes Relative 8 %   Monocytes Absolute 0.6 0 - 1 K/uL   Eosinophils Relative 0 %   Eosinophils Absolute 0.0 0 - 0 K/uL   Basophils Relative 0 %   Basophils Absolute 0.0 0 - 0 K/uL   Immature Granulocytes 0 %   Abs Immature Granulocytes 0.02 0.00 - 0.07 K/uL    Comment: Performed at [facility], [address] [place], [place] [address]  Comprehensive metabolic panel     Status: Abnormal   Collection Time: 02/23/20  4:29 PM  Result Value Ref Range   Sodium 137 135 - 145 mmol/L   Potassium 4.5 3.5 - 5.1 mmol/L   Chloride 100 98 - 111 mmol/L   CO2 27 22 - 32 mmol/L   Glucose, Bld 119 (H) 70 - 99 mg/dL    Comment: Glucose reference range applies only to samples taken after fasting for at least 8 hours.   BUN 10 8 - 23 mg/dL   Creatinine, Ser 2.35 0.44 - 1.00 mg/dL   Calcium 9.4 8.9 - 36.1 mg/dL   Total  Protein 8.2 (H) 6.5 - 8.1 g/dL   Albumin 4.3 3.5 - 5.0 g/dL   AST 17 15 - 41 U/L   ALT 20 0 - 44 U/L   Alkaline Phosphatase 70 38 - 126 U/L   Total Bilirubin 0.5 0.3 - 1.2 mg/dL   GFR calc non Af Amer >60 >60 mL/min   GFR calc Af Amer >60 >60 mL/min   Anion gap 10 5 - 15    Comment: Performed at [facility], [address] [place], [place] [address]  SARS Coronavirus 2 by RT PCR (hospital order, performed in [facility] hospital lab) Nasopharyngeal Nasopharyngeal Swab     Status: None   Collection Time: 02/23/20  4:30 PM   Specimen: Nasopharyngeal Swab  Result Value Ref Range   SARS Coronavirus 2  NEGATIVE NEGATIVE    Comment: (NOTE) SARS-CoV-2 target nucleic acids are NOT DETECTED.  The SARS-CoV-2 RNA is generally detectable in upper and lower respiratory specimens during the acute phase of infection. The lowest concentration of SARS-CoV-2 viral copies this assay can detect is 250 copies / mL. A negative result does not preclude SARS-CoV-2 infection and should not be used as the sole basis for treatment or other patient management decisions.  A negative result may occur with improper specimen collection / handling, submission of specimen other than nasopharyngeal swab, presence of viral mutation(s) within the areas targeted by this assay, and inadequate number of viral copies (<250 copies / mL). A negative result must be combined with clinical observations, patient history, and epidemiological information.  Fact Sheet for Patients:   BoilerBrush.com.cy  Fact Sheet for Healthcare Providers: https://pope.com/  This test is not yet approved or  cleared by the [place] FDA and has been authorized for detection and/or diagnosis of SARS-CoV-2 by FDA under an Emergency Use Authorization (EUA).  This EUA will remain in effect (meaning this test can be used) for the duration of the COVID-19 declaration under Section  564(b)(1) of the Act, 21 U.S.C. section 360bbb-3(b)(1), unless the authorization is terminated or revoked sooner.  Performed at [facility], [address] [place], [place] [address]    CT Angio Head W or [place] Contrast  Result Date: 02/23/2020 CLINICAL DATA:  Cerebral hemorrhage suspected. EXAM: CT ANGIOGRAPHY HEAD AND NECK TECHNIQUE: Multidetector CT imaging of the head and neck was performed using the standard protocol during bolus administration of intravenous contrast. Multiplanar CT image reconstructions and MIPs were obtained to evaluate the vascular anatomy. Carotid stenosis measurements (when applicable) are obtained utilizing NASCET criteria, using the distal internal carotid diameter as the denominator. CONTRAST:  OMNIPAQUE IOHEXOL 350 MG/ML SOLN COMPARISON:  Noncontrast head CT performed earlier the same day 02/23/2019, MRI/MRA head 01/14/2020, MRA neck 01/14/2020. FINDINGS: CTA NECK FINDINGS Aortic arch: Common origin of the innominate and left common carotid arteries. Mild atherosclerotic plaque within the visualized aortic arch and proximal major branch vessels of the neck. No hemodynamically significant innominate or proximal subclavian artery stenosis. Right carotid system: CCA and ICA patent within the neck without significant stenosis (50% or greater). Mild calcified plaque within the proximal ICA Left carotid system: CCA and ICA patent within the neck without significant stenosis (50% or greater). Mild calcified plaque within the proximal ICA. Vertebral arteries: Codominant and patent within the neck without significant stenosis. Skeleton: No acute bony abnormality. Cervical spondylosis without high-grade bony spinal canal narrowing. C4-C5 grade 1 anterolisthesis. Other neck: No neck mass or cervical lymphadenopathy. Upper chest: No consolidation within the imaged lung apices. Review of the MIP images confirms the above findings CTA HEAD FINDINGS Anterior circulation:  The intracranial internal carotid arteries are patent. Mild atherosclerotic narrowing of the cavernous segment on the right. The M1 middle cerebral arteries are patent without significant stenosis. No M2 proximal branch occlusion or high-grade proximal stenosis is identified. The anterior cerebral arteries are patent. No aneurysm or arteriovenous malformation is identified. Posterior circulation: The intracranial vertebral arteries are patent without significant stenosis, as is the basilar artery. The posterior cerebral arteries are patent without significant proximal stenosis. A small left posterior communicating artery is present. The right posterior communicating artery is hypoplastic or absent. Venous sinuses: Within limitations of contrast timing, no convincing thrombus. Anatomic variants: As described Review of the MIP images confirms the above findings IMPRESSION: CTA neck:  The common carotid, internal carotid and vertebral arteries are patent within the neck without hemodynamically significant stenosis. Mild atherosclerotic disease as described. CTA head: 1. No intracranial aneurysm or AVM is identified. Follow-up CT or MR angiography following resolution of the right parietal hematoma can be considered for further evaluation, although no aneurysm or AVM was identified on the prior MRA of 01/14/2020. 2. No intracranial large vessel occlusion or proximal high-grade arterial stenosis. Electronically Signed   By: Jackey Loge DO   On: 02/23/2020 17:32   CT Head Wo Contrast  Result Date: 02/23/2020 CLINICAL DATA:  Headache, forehead pain for 2 days, near syncope EXAM: CT HEAD WITHOUT CONTRAST TECHNIQUE: Contiguous axial images were obtained from the base of the skull through the vertex without intravenous contrast. COMPARISON:  01/13/2020 FINDINGS: Brain: There is a new area of intraparenchymal hemorrhage within the right parietal convexity, measuring 4.7 x 3.8 cm in transverse direction and extending  approximately 4.8 cm in craniocaudal length. There is significant surrounding edema, with effacement of the right lateral ventricle. Basilar cisterns are preserved. There is no midline shift. Vascular: No hyperdense vessel or unexpected calcification. Skull: Normal. Negative for fracture or focal lesion. Sinuses/Orbits: No acute finding. Other: None. IMPRESSION: 1. Large right parietal intraparenchymal hemorrhage measuring 4.8 x 4.7 x 3.8 cm. Significant surrounding edema with effacement of the right lateral ventricle. These results were called by telephone at the time of interpretation on 02/23/2020 at 3:59 pm to provider DAN FLOYD , who verbally acknowledged these results. Electronically Signed   By: Sharlet Salina M.D.   On: 02/23/2020 16:05   CT Angio Neck W and/or Wo Contrast  Result Date: 02/23/2020 CLINICAL DATA:  Cerebral hemorrhage suspected. EXAM: CT ANGIOGRAPHY HEAD AND NECK TECHNIQUE: Multidetector CT imaging of the head and neck was performed using the standard protocol during bolus administration of intravenous contrast. Multiplanar CT image reconstructions and MIPs were obtained to evaluate the vascular anatomy. Carotid stenosis measurements (when applicable) are obtained utilizing NASCET criteria, using the distal internal carotid diameter as the denominator. CONTRAST:  OMNIPAQUE IOHEXOL 350 MG/ML SOLN COMPARISON:  Noncontrast head CT performed earlier the same day 02/23/2019, MRI/MRA head 01/14/2020, MRA neck 01/14/2020. FINDINGS: CTA NECK FINDINGS Aortic arch: Common origin of the innominate and left common carotid arteries. Mild atherosclerotic plaque within the visualized aortic arch and proximal major branch vessels of the neck. No hemodynamically significant innominate or proximal subclavian artery stenosis. Right carotid system: CCA and ICA patent within the neck without significant stenosis (50% or greater). Mild calcified plaque within the proximal ICA Left carotid system: CCA and  ICA patent within the neck without significant stenosis (50% or greater). Mild calcified plaque within the proximal ICA. Vertebral arteries: Codominant and patent within the neck without significant stenosis. Skeleton: No acute bony abnormality. Cervical spondylosis without high-grade bony spinal canal narrowing. C4-C5 grade 1 anterolisthesis. Other neck: No neck mass or cervical lymphadenopathy. Upper chest: No consolidation within the imaged lung apices. Review of the MIP images confirms the above findings CTA HEAD FINDINGS Anterior circulation: The intracranial internal carotid arteries are patent. Mild atherosclerotic narrowing of the cavernous segment on the right. The M1 middle cerebral arteries are patent without significant stenosis. No M2 proximal branch occlusion or high-grade proximal stenosis is identified. The anterior cerebral arteries are patent. No aneurysm or arteriovenous malformation is identified. Posterior circulation: The intracranial vertebral arteries are patent without significant stenosis, as is the basilar artery. The posterior cerebral arteries are patent without significant proximal stenosis.  A small left posterior communicating artery is present. The right posterior communicating artery is hypoplastic or absent. Venous sinuses: Within limitations of contrast timing, no convincing thrombus. Anatomic variants: As described Review of the MIP images confirms the above findings IMPRESSION: CTA neck: The common carotid, internal carotid and vertebral arteries are patent within the neck without hemodynamically significant stenosis. Mild atherosclerotic disease as described. CTA head: 1. No intracranial aneurysm or AVM is identified. Follow-up CT or MR angiography following resolution of the right parietal hematoma can be considered for further evaluation, although no aneurysm or AVM was identified on the prior MRA of 01/14/2020. 2. No intracranial large vessel occlusion or proximal high-grade  arterial stenosis. Electronically Signed   By: Jackey LogeKyle  Golden DO   On: 02/23/2020 17:32   CT VENOGRAM HEAD  Result Date: 02/23/2020 CLINICAL DATA:  Provided history: Brain bleed. EXAM: CT VENOGRAM HEAD TECHNIQUE: CT venography of the head was performed following the administration 100 mL Omnipaque 350 intravenous contrast. Coronal and sagittal reconstructions were also submitted for evaluation. Additionally, axial, coronal and sagittal MIP reconstructions were submitted for evaluation. CONTRAST:  100mL OMNIPAQUE IOHEXOL 350 MG/ML SOLN COMPARISON:  Same day non-contrast head CT and CT angiogram head/neck 02/23/2020 FINDINGS: The superior sagittal sinus, internal cerebral veins, vein of Galen, straight sinus, transverse sinuses, sigmoid sinuses and visualized jugular veins are patent. There is no appreciable intracranial venous thrombosis. A large right parietal parenchymal hematoma is grossly unchanged in size. Redemonstrated effacement of the posterior right lateral ventricle. No midline shift. No abnormal parenchymal enhancement is identified. IMPRESSION: No evidence of intracranial venous thrombosis. Electronically Signed   By: Jackey LogeKyle  Golden DO   On: 02/23/2020 17:41     Assessment: 64 year old female with acute right parietal-frontal ICH 1. Exam reveals left sided deficits, including left hemianopia.  2. ICH score: 2 3. Underlying etiology most likely cerebral amyloid angiopathy given the MRI findings from June and recurrent gross hemorrhages 4. History of seizures. Most likely due to a seizure focus associated with one or more of the microbleeds and/or the chronic right parietal hemorrhage seen on the MRI from June  Plan: 1. Admit to ICU under Neurology service 2. Continue Keppra at 750 mg IV BID 3. EEG in the AM 4. Repeat CT head in 24 hours.  5. PT consult, OT consult, Speech consult 6. Cardiac telemetry 7. Frequent neuro checks 8. BP management with clevidipine drip. Goal SBP < 140 9. No  antiplatelet medications or anticoagulants. DVT prophylaxis with SCDs   Electronically signed: Dr. Caryl PinaEric Uriah Philipson 02/23/2020, 10:45 PM

## 2020-02-23 NOTE — ED Notes (Signed)
Patient transported to CT 

## 2020-02-23 NOTE — ED Notes (Signed)
Verbal order Dr. Adela Lank for pt to take her home Keppra that she generally takes at 10pm.  Medication given by pts daughter.

## 2020-02-24 ENCOUNTER — Inpatient Hospital Stay (HOSPITAL_COMMUNITY): Payer: Self-pay

## 2020-02-24 DIAGNOSIS — R569 Unspecified convulsions: Secondary | ICD-10-CM

## 2020-02-24 DIAGNOSIS — I680 Cerebral amyloid angiopathy: Secondary | ICD-10-CM

## 2020-02-24 DIAGNOSIS — I6389 Other cerebral infarction: Secondary | ICD-10-CM

## 2020-02-24 LAB — CBC
HCT: 40.9 % (ref 36.0–46.0)
Hemoglobin: 13.1 g/dL (ref 12.0–15.0)
MCH: 28.5 pg (ref 26.0–34.0)
MCHC: 32 g/dL (ref 30.0–36.0)
MCV: 89.1 fL (ref 80.0–100.0)
Platelets: 261 10*3/uL (ref 150–400)
RBC: 4.59 MIL/uL (ref 3.87–5.11)
RDW: 14 % (ref 11.5–15.5)
WBC: 7 10*3/uL (ref 4.0–10.5)
nRBC: 0 % (ref 0.0–0.2)

## 2020-02-24 LAB — BASIC METABOLIC PANEL
Anion gap: 10 (ref 5–15)
BUN: 8 mg/dL (ref 8–23)
CO2: 25 mmol/L (ref 22–32)
Calcium: 9.7 mg/dL (ref 8.9–10.3)
Chloride: 105 mmol/L (ref 98–111)
Creatinine, Ser: 0.7 mg/dL (ref 0.44–1.00)
GFR calc Af Amer: >60 mL/min (ref >60)
GFR calc non Af Amer: >60 mL/min (ref >60)
Glucose, Bld: 111 mg/dL — ABNORMAL HIGH (ref 70–99)
Potassium: 3.8 mmol/L (ref 3.5–5.1)
Sodium: 140 mmol/L (ref 135–145)

## 2020-02-24 LAB — MRSA PCR SCREENING: MRSA by PCR: NEGATIVE

## 2020-02-24 LAB — ECHOCARDIOGRAM COMPLETE
Area-P 1/2: 2.99 cm2
Height: 63 in
S' Lateral: 3.4 cm
Weight: 2544 oz

## 2020-02-24 LAB — HEMOGLOBIN A1C
Hgb A1c MFr Bld: 5.7 % — ABNORMAL HIGH (ref 4.8–5.6)
Mean Plasma Glucose: 116.89 mg/dL

## 2020-02-24 LAB — LIPID PANEL
Cholesterol: 204 mg/dL — ABNORMAL HIGH (ref 0–200)
HDL: 54 mg/dL (ref >40)
LDL Cholesterol: 122 mg/dL — ABNORMAL HIGH (ref 0–99)
Total CHOL/HDL Ratio: 3.8 RATIO
Triglycerides: 140 mg/dL (ref <150)
VLDL: 28 mg/dL (ref 0–40)

## 2020-02-24 MED ORDER — AMLODIPINE BESYLATE 10 MG PO TABS
10.0000 mg | ORAL_TABLET | Freq: Every day | ORAL | Status: DC
  Administered 2020-02-24 – 2020-02-26 (×3): 10 mg via ORAL
  Filled 2020-02-24 (×3): qty 1

## 2020-02-24 MED ORDER — HYDRALAZINE HCL 20 MG/ML IJ SOLN
5.0000 mg | INTRAMUSCULAR | Status: DC | PRN
  Administered 2020-02-24: 5 mg via INTRAVENOUS
  Administered 2020-02-24: 10 mg via INTRAVENOUS
  Filled 2020-02-24 (×2): qty 1

## 2020-02-24 MED ORDER — PANTOPRAZOLE SODIUM 40 MG PO TBEC
40.0000 mg | DELAYED_RELEASE_TABLET | Freq: Every day | ORAL | Status: DC
  Administered 2020-02-24 – 2020-02-26 (×3): 40 mg via ORAL
  Filled 2020-02-24 (×3): qty 1

## 2020-02-24 MED ORDER — LEVETIRACETAM 750 MG PO TABS
750.0000 mg | ORAL_TABLET | Freq: Two times a day (BID) | ORAL | Status: DC
  Administered 2020-02-24 – 2020-02-26 (×5): 750 mg via ORAL
  Filled 2020-02-24 (×6): qty 1

## 2020-02-24 NOTE — Progress Notes (Signed)
EEG completed, results pending. 

## 2020-02-24 NOTE — Procedures (Addendum)
Patient Name: Stacy Flores  MRN: [id number]  Epilepsy Attending: Charlsie Quest  Referring Physician/Provider: Dr Caryl Pina Date: 02/24/2020 Duration: 23.32 mins  Patient history: 64 year old female with h/o seizures now with acute right parietal-frontal ICH. EEG to evaluate for seizure.  Level of alertness: Awake, asleep  AEDs during EEG study: LEV  Technical aspects: This EEG study was done with scalp electrodes positioned according to the 10-20 International system of electrode placement. Electrical activity was acquired at a sampling rate of 500Hz  and reviewed with a high frequency filter of 70Hz  and a low frequency filter of 1Hz . EEG data were recorded continuously and digitally stored.   Description: The posterior dominant rhythm consists of 9 Hz activity of moderate voltage (25-35 uV) seen predominantly in posterior head regions, asymmetric ( R<L) and reactive to eye opening and eye closing. Drowsiness was characterized by attenuation of the posterior background rhythm. Sleep was characterized by vertex waves, sleep spindles (12 to 14 Hz), maximal frontocentral region.  EEG showed intermittent generalized 2-3 Hz delta slowing as well as continuous 3-6hz  theta-delta slowing in right hemisphere, maximal right posterior quadrant. Sharp transients were also seen in right parietal region.  Hyperventilation and photic stimulation were not performed.     ABNORMALITY -Intermittent slow, generalized -Continuous slow, right hemisphere, maximal right posterior quadrant - Background asymmetry, R<L  IMPRESSION: This study is suggestive of cortical dysfunction in right hemisphere, maximal right posterior quadrant likely secondary to underlying hemorrhage as well as mild diffuse encephalopathy, nonspecific etiology. No seizures or definite epileptiform discharges were seen throughout the recording.  Aleta Manternach 

## 2020-02-24 NOTE — Evaluation (Signed)
Physical Therapy Evaluation Patient Details Name: Stacy Flores MRN: [id number] DOB: [birth year] Today's Date: 02/24/2020   History of Present Illness  64 y.o. 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 She also complained of LUE shaking the night before, followed by confusion, that was concerning for seizure. Family felt that they saw some right-sided facial drooping which was new. CT head at [facility] revealed a large right parietal intraparenchymal hemorrhage with significant edema and effacement of the R lateral ventricle.  Clinical Impression  Pt presents to PT with deficits in functional mobility, gait, balance, endurance, power. Pt is limited by orthostatic hypotension during session, reporting dizziness after standing and with a short bout of mobility. Pt requires minG for all OOB mobility to steady at this time, and is at an increased risk for falls due to acute as well as chronic deficits from prior strokes, including L hemianopsia. Pt will continue to benefit form acute PT POC to improve activity tolerance and reduce falls risk. PT recommends return home with HHPT and supervision from family for all OOB mobility. PT will continue to assess DME as pt may require RW or cane to improve stability at the time of discharge.    Follow Up Recommendations Home health PT;Supervision - Intermittent    Equipment Recommendations   (TBD, may need RW vs cane pending progress)    Recommendations for Other Services       Precautions / Restrictions Precautions Precautions: Fall Precaution Comments: per Dr. Roda Shutters SBP <130 Restrictions Weight Bearing Restrictions: No      Mobility  Bed Mobility Overal bed mobility: Needs Assistance Bed Mobility: Supine to Sit;Rolling;Sit to  Supine Rolling: Min assist   Supine to sit: Supervision Sit to supine: Supervision   General bed mobility comments: physical assistance to roll moreso due to difficulty understanding command than need for physical assist  Transfers Overall transfer level: Needs assistance Equipment used: None Transfers: Sit to/from Stand;Stand Pivot Transfers Sit to Stand: Supervision Stand pivot transfers: Supervision          Ambulation/Gait Ambulation/Gait assistance: Min guard Gait Distance (Feet): 12 Feet Assistive device: None Gait Pattern/deviations: Step-to pattern Gait velocity: reduced Gait velocity interpretation: <1.8 ft/sec, indicate of risk for recurrent falls General Gait Details: pt with short step to gait in room, limited by dizziness  Stairs            Wheelchair Mobility    Modified Rankin (Stroke Patients Only) Modified Rankin (Stroke Patients Only) Pre-Morbid Rankin Score: No significant disability Modified Rankin: Moderately severe disability     Balance Overall balance assessment: Needs assistance Sitting-balance support: No upper extremity supported;Feet supported Sitting balance-Leahy Scale: Good Sitting balance - Comments: supervision   Standing balance support: No upper extremity supported Standing balance-Leahy Scale: Poor Standing balance comment: minG or reliant on UE support                             Pertinent Vitals/Pain Pain Assessment: Faces Faces Pain Scale: Hurts even more Pain Location: head Pain Descriptors / Indicators: Aching Pain Intervention(s): Monitored during session    Home Living Family/patient expects to be discharged to:: Private residence Living Arrangements: Spouse/significant other Available Help at Discharge: Family;Available 24 hours/day Type of Home: House  Home Access: Stairs to enter Entrance Stairs-Rails: None Entrance Stairs-Number of Steps: 1 (threshold) Home Layout: One level Home  Equipment: None      Prior Function Level of Independence: Independent         Comments: loves to eat (ice cream - butter peacan)     Hand Dominance   Dominant Hand: Right    Extremity/Trunk Assessment   Upper Extremity Assessment Upper Extremity Assessment: Defer to OT evaluation (dtr reports reduced use of LUE, nothing noticeable)    Lower Extremity Assessment Lower Extremity Assessment: Generalized weakness    Cervical / Trunk Assessment Cervical / Trunk Assessment: Kyphotic  Communication   Communication: No difficulties  Cognition Arousal/Alertness: Awake/alert Behavior During Therapy: WFL for tasks assessed/performed Overall Cognitive Status: Impaired/Different from baseline Area of Impairment: Safety/judgement;Awareness;Problem solving;Following commands                       Following Commands: Follows one step commands with increased time Safety/Judgement: Decreased awareness of deficits Awareness: Intellectual Problem Solving: Slow processing        General Comments General comments (skin integrity, edema, etc.): pt limited by orthostatic hypotension. BP 127/65 pre-mobility, 102/59 after transfer to chair, 126/65 back in supine. Pt reports dizziness    Exercises     Assessment/Plan    PT Assessment Patient needs continued PT services  PT Problem List Decreased strength;Decreased activity tolerance;Decreased balance;Decreased mobility;Decreased knowledge of use of DME;Decreased safety awareness;Decreased knowledge of precautions;Pain       PT Treatment Interventions DME instruction;Gait training;Stair training;Functional mobility training;Therapeutic activities;Therapeutic exercise;Balance training;Neuromuscular re-education;Patient/family education    PT Goals (Current goals can be found in the Care Plan section)  Acute Rehab PT Goals Patient Stated Goal: To return to baseline PT Goal Formulation: With patient Time For Goal Achievement:  03/09/20 Potential to Achieve Goals: Good    Frequency Min 4X/week   Barriers to discharge        Co-evaluation               AM-PAC PT "6 Clicks" Mobility  Outcome Measure Help needed turning from your back to your side while in a flat bed without using bedrails?: A Little Help needed moving from lying on your back to sitting on the side of a flat bed without using bedrails?: None Help needed moving to and from a bed to a chair (including a wheelchair)?: None Help needed standing up from a chair using your arms (e.g., wheelchair or bedside chair)?: None Help needed to walk in hospital room?: A Little Help needed climbing 3-5 steps with a railing? : A Lot 6 Click Score: 20    End of Session   Activity Tolerance: Treatment limited secondary to medical complications (Comment) (orthostatic) Patient left: in bed;with call bell/phone within reach;with bed alarm set;with family/visitor present Nurse Communication: Mobility status PT Visit Diagnosis: Unsteadiness on feet (R26.81);Other abnormalities of gait and mobility (R26.89);Muscle weakness (generalized) (M62.81);Other symptoms and signs involving the nervous system (R29.898)    Time: 3220-2542 PT Time Calculation (min) (ACUTE ONLY): 27 min   Charges:   PT Evaluation $PT Eval Moderate Complexity: 1 Mod PT Treatments $Therapeutic Activity: 8-22 mins        Arlyss Gandy, PT, DPT Acute Rehabilitation Pager: [phone]   Arlyss Gandy 02/24/2020, 2:17 PM

## 2020-02-24 NOTE — Progress Notes (Signed)
STROKE TEAM PROGRESS NOTE   SUBJECTIVE (INTERVAL HISTORY) Her RN is at the bedside.  Overall her condition is stable. She is sitting in bed for breakfast. She is orientated x 3. She still has left hemianopia. Moving all extremities equally. BP 130s. Will resume home BP meds.  OBJECTIVE Temp:  [98.2 F (36.8 C)-99 F (37.2 C)] 98.4 F (36.9 C) (07/16 0800) Pulse Rate:  [63-90] 70 (07/16 0700) Resp:  [12-22] 14 (07/16 0700) BP: (105-155)/(60-86) 126/86 (07/16 0700) SpO2:  [96 %-100 %] 99 % (07/16 0700) Weight:  [72.1 kg] 72.1 kg (07/15 1426)  Recent Labs  Lab 02/23/20 1551  GLUCAP 106*   Recent Labs  Lab 02/23/20 1629 02/24/20 0805  NA 137 140  K 4.5 3.8  CL 100 105  CO2 27 25  GLUCOSE 119* 111*  BUN 10 8  CREATININE 0.71 0.70  CALCIUM 9.4 9.7   Recent Labs  Lab 02/23/20 1629  AST 17  ALT 20  ALKPHOS 70  BILITOT 0.5  PROT 8.2*  ALBUMIN 4.3   Recent Labs  Lab 02/23/20 1629 02/24/20 0805  WBC 7.5 7.0  NEUTROABS 5.0  --   HGB 13.7 13.1  HCT 42.0 40.9  MCV 87.1 89.1  PLT 269 261   No results for input(s): CKTOTAL, CKMB, CKMBINDEX, TROPONINI in the last 168 hours. No results for input(s): LABPROT, INR in the last 72 hours. No results for input(s): COLORURINE, LABSPEC, PHURINE, GLUCOSEU, HGBUR, BILIRUBINUR, KETONESUR, PROTEINUR, UROBILINOGEN, NITRITE, LEUKOCYTESUR in the last 72 hours.  Invalid input(s): APPERANCEUR     Component Value Date/Time   CHOL 204 (H) 02/24/2020 0805   TRIG 140 02/24/2020 0805   HDL 54 02/24/2020 0805   CHOLHDL 3.8 02/24/2020 0805   VLDL 28 02/24/2020 0805   LDLCALC 122 (H) 02/24/2020 0805   Lab Results  Component Value Date   HGBA1C 5.7 (H) 02/24/2020      Component Value Date/Time   LABOPIA NONE DETECTED 01/13/2020 1850   COCAINSCRNUR NONE DETECTED 01/13/2020 1850   LABBENZ NONE DETECTED 01/13/2020 1850   AMPHETMU NONE DETECTED 01/13/2020 1850   THCU NONE DETECTED 01/13/2020 1850   LABBARB NONE DETECTED 01/13/2020  1850    No results for input(s): ETH in the last 168 hours.  I have personally reviewed the radiological images below and agree with the radiology interpretations.  CT Angio Head W or Wo Contrast  Result Date: 02/23/2020 CLINICAL DATA:  Cerebral hemorrhage suspected. EXAM: CT ANGIOGRAPHY HEAD AND NECK TECHNIQUE: Multidetector CT imaging of the head and neck was performed using the standard protocol during bolus administration of intravenous contrast. Multiplanar CT image reconstructions and MIPs were obtained to evaluate the vascular anatomy. Carotid stenosis measurements (when applicable) are obtained utilizing NASCET criteria, using the distal internal carotid diameter as the denominator. CONTRAST:  OMNIPAQUE IOHEXOL 350 MG/ML SOLN COMPARISON:  Noncontrast head CT performed earlier the same day 02/23/2019, MRI/MRA head 01/14/2020, MRA neck 01/14/2020. FINDINGS: CTA NECK FINDINGS Aortic arch: Common origin of the innominate and left common carotid arteries. Mild atherosclerotic plaque within the visualized aortic arch and proximal major branch vessels of the neck. No hemodynamically significant innominate or proximal subclavian artery stenosis. Right carotid system: CCA and ICA patent within the neck without significant stenosis (50% or greater). Mild calcified plaque within the proximal ICA Left carotid system: CCA and ICA patent within the neck without significant stenosis (50% or greater). Mild calcified plaque within the proximal ICA. Vertebral arteries: Codominant and patent within the neck  without significant stenosis. Skeleton: No acute bony abnormality. Cervical spondylosis without high-grade bony spinal canal narrowing. C4-C5 grade 1 anterolisthesis. Other neck: No neck mass or cervical lymphadenopathy. Upper chest: No consolidation within the imaged lung apices. Review of the MIP images confirms the above findings CTA HEAD FINDINGS Anterior circulation: The intracranial internal carotid  arteries are patent. Mild atherosclerotic narrowing of the cavernous segment on the right. The M1 middle cerebral arteries are patent without significant stenosis. No M2 proximal branch occlusion or high-grade proximal stenosis is identified. The anterior cerebral arteries are patent. No aneurysm or arteriovenous malformation is identified. Posterior circulation: The intracranial vertebral arteries are patent without significant stenosis, as is the basilar artery. The posterior cerebral arteries are patent without significant proximal stenosis. A small left posterior communicating artery is present. The right posterior communicating artery is hypoplastic or absent. Venous sinuses: Within limitations of contrast timing, no convincing thrombus. Anatomic variants: As described Review of the MIP images confirms the above findings IMPRESSION: CTA neck: The common carotid, internal carotid and vertebral arteries are patent within the neck without hemodynamically significant stenosis. Mild atherosclerotic disease as described. CTA head: 1. No intracranial aneurysm or AVM is identified. Follow-up CT or MR angiography following resolution of the right parietal hematoma can be considered for further evaluation, although no aneurysm or AVM was identified on the prior MRA of 01/14/2020. 2. No intracranial large vessel occlusion or proximal high-grade arterial stenosis. Electronically Signed   By: Jackey Loge DO   On: 02/23/2020 17:32   CT Head Wo Contrast  Result Date: 02/23/2020 CLINICAL DATA:  Headache, forehead pain for 2 days, near syncope EXAM: CT HEAD WITHOUT CONTRAST TECHNIQUE: Contiguous axial images were obtained from the base of the skull through the vertex without intravenous contrast. COMPARISON:  01/13/2020 FINDINGS: Brain: There is a new area of intraparenchymal hemorrhage within the right parietal convexity, measuring 4.7 x 3.8 cm in transverse direction and extending approximately 4.8 cm in craniocaudal  length. There is significant surrounding edema, with effacement of the right lateral ventricle. Basilar cisterns are preserved. There is no midline shift. Vascular: No hyperdense vessel or unexpected calcification. Skull: Normal. Negative for fracture or focal lesion. Sinuses/Orbits: No acute finding. Other: None. IMPRESSION: 1. Large right parietal intraparenchymal hemorrhage measuring 4.8 x 4.7 x 3.8 cm. Significant surrounding edema with effacement of the right lateral ventricle. These results were called by telephone at the time of interpretation on 02/23/2020 at 3:59 pm to provider DAN FLOYD , who verbally acknowledged these results. Electronically Signed   By: Sharlet Salina M.D.   On: 02/23/2020 16:05   CT Angio Neck W and/or Wo Contrast  Result Date: 02/23/2020 CLINICAL DATA:  Cerebral hemorrhage suspected. EXAM: CT ANGIOGRAPHY HEAD AND NECK TECHNIQUE: Multidetector CT imaging of the head and neck was performed using the standard protocol during bolus administration of intravenous contrast. Multiplanar CT image reconstructions and MIPs were obtained to evaluate the vascular anatomy. Carotid stenosis measurements (when applicable) are obtained utilizing NASCET criteria, using the distal internal carotid diameter as the denominator. CONTRAST:  OMNIPAQUE IOHEXOL 350 MG/ML SOLN COMPARISON:  Noncontrast head CT performed earlier the same day 02/23/2019, MRI/MRA head 01/14/2020, MRA neck 01/14/2020. FINDINGS: CTA NECK FINDINGS Aortic arch: Common origin of the innominate and left common carotid arteries. Mild atherosclerotic plaque within the visualized aortic arch and proximal major branch vessels of the neck. No hemodynamically significant innominate or proximal subclavian artery stenosis. Right carotid system: CCA and ICA patent within the neck without  significant stenosis (50% or greater). Mild calcified plaque within the proximal ICA Left carotid system: CCA and ICA patent within the neck without  significant stenosis (50% or greater). Mild calcified plaque within the proximal ICA. Vertebral arteries: Codominant and patent within the neck without significant stenosis. Skeleton: No acute bony abnormality. Cervical spondylosis without high-grade bony spinal canal narrowing. C4-C5 grade 1 anterolisthesis. Other neck: No neck mass or cervical lymphadenopathy. Upper chest: No consolidation within the imaged lung apices. Review of the MIP images confirms the above findings CTA HEAD FINDINGS Anterior circulation: The intracranial internal carotid arteries are patent. Mild atherosclerotic narrowing of the cavernous segment on the right. The M1 middle cerebral arteries are patent without significant stenosis. No M2 proximal branch occlusion or high-grade proximal stenosis is identified. The anterior cerebral arteries are patent. No aneurysm or arteriovenous malformation is identified. Posterior circulation: The intracranial vertebral arteries are patent without significant stenosis, as is the basilar artery. The posterior cerebral arteries are patent without significant proximal stenosis. A small left posterior communicating artery is present. The right posterior communicating artery is hypoplastic or absent. Venous sinuses: Within limitations of contrast timing, no convincing thrombus. Anatomic variants: As described Review of the MIP images confirms the above findings IMPRESSION: CTA neck: The common carotid, internal carotid and vertebral arteries are patent within the neck without hemodynamically significant stenosis. Mild atherosclerotic disease as described. CTA head: 1. No intracranial aneurysm or AVM is identified. Follow-up CT or MR angiography following resolution of the right parietal hematoma can be considered for further evaluation, although no aneurysm or AVM was identified on the prior MRA of 01/14/2020. 2. No intracranial large vessel occlusion or proximal high-grade arterial stenosis. Electronically  Signed   By: Jackey LogeKyle  Golden DO   On: 02/23/2020 17:32   CT VENOGRAM HEAD  Result Date: 02/23/2020 CLINICAL DATA:  Provided history: Brain bleed. EXAM: CT VENOGRAM HEAD TECHNIQUE: CT venography of the head was performed following the administration 100 mL Omnipaque 350 intravenous contrast. Coronal and sagittal reconstructions were also submitted for evaluation. Additionally, axial, coronal and sagittal MIP reconstructions were submitted for evaluation. CONTRAST:  100mL OMNIPAQUE IOHEXOL 350 MG/ML SOLN COMPARISON:  Same day non-contrast head CT and CT angiogram head/neck 02/23/2020 FINDINGS: The superior sagittal sinus, internal cerebral veins, vein of Galen, straight sinus, transverse sinuses, sigmoid sinuses and visualized jugular veins are patent. There is no appreciable intracranial venous thrombosis. A large right parietal parenchymal hematoma is grossly unchanged in size. Redemonstrated effacement of the posterior right lateral ventricle. No midline shift. No abnormal parenchymal enhancement is identified. IMPRESSION: No evidence of intracranial venous thrombosis. Electronically Signed   By: Jackey LogeKyle  Golden DO   On: 02/23/2020 17:41    PHYSICAL EXAM  Temp:  [98.2 F (36.8 C)-99 F (37.2 C)] 98.4 F (36.9 C) (07/16 0800) Pulse Rate:  [63-90] 70 (07/16 0700) Resp:  [12-22] 14 (07/16 0700) BP: (105-155)/(60-86) 126/86 (07/16 0700) SpO2:  [96 %-100 %] 99 % (07/16 0700) Weight:  [72.1 kg] 72.1 kg (07/15 1426)  General - Well nourished, well developed, in no apparent distress.  Ophthalmologic - fundi not visualized due to noncooperation.  Cardiovascular - Regular rhythm and rate.  Mental Status -  Level of arousal and orientation to time, place, and person were intact. Language including expression, naming, repetition, comprehension was assessed and found intact.  Cranial Nerves II - XII - II - left hemianopia. III, IV, VI - Extraocular movements intact except left eye mild INO. Chronic left  "lazy eye" per  pt V - Facial sensation intact bilaterally. VII - left nasolabial fold mild flattening. VIII - Hearing & vestibular intact bilaterally. X - Palate elevates symmetrically. XI - Chin turning & shoulder shrug intact bilaterally. XII - Tongue protrusion intact.  Motor Strength - The patient's strength was normal in all extremities and pronator drift was absent.  Bulk was normal and fasciculations were absent.   Motor Tone - Muscle tone was assessed at the neck and appendages and was normal.  Reflexes - The patient's reflexes were symmetrical in all extremities and she had no pathological reflexes.  Sensory - Light touch, temperature/pinprick were assessed and were symmetrical.    Coordination - The patient had normal movements in the hands with no ataxia or dysmetria although slow on action.  Tremor was absent.  Gait and Station - deferred.   ASSESSMENT/PLAN [name] is a 64 y.o. female with history of ICH, CAA, seizure admitted for HA, confusion, speaking slower than before. No tPA given due to ICH.    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 CUS neg. EF 60-65%. LDL 93 and A1C 5.4. UDS neg. Recommend no antithrombotics and keep BP < 140.   Seizure  01/2020 admitted for seizure activity  EEG 01/14/20 no seizure, cortical dysfunction in right hemisphere, maximal right temporo-parietal region    On keppra 750 bid  This admission EEG pending  Continue keppra  BP management . BP 130s . Home meds amlodipine 5 . Now on amlodipine 10 . 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 This patient's care requires constant monitoring of vital signs, hemodynamics, respiratory and cardiac monitoring, review of multiple databases, neurological assessment, discussion with family, other specialists and medical decision making of high complexity. I spent 35 minutes of neurocritical care time in the care of this patient.  Marvel Plan, MD PhD Stroke Neurology 02/24/2020 10:09 AM    To contact Stroke Continuity provider, please refer to [email or web address]. After hours, contact General Neurology

## 2020-02-24 NOTE — Progress Notes (Signed)
  Echocardiogram 2D Echocardiogram has been performed.  Delcie Roch 02/24/2020, 11:41 AM

## 2020-02-24 NOTE — Plan of Care (Signed)
  Problem: Clinical Measurements: Goal: Ability to maintain clinical measurements within normal limits will improve Outcome: Progressing   

## 2020-02-24 NOTE — Progress Notes (Signed)
OT Cancellation Note  Patient Details Name: Stacy Flores MRN: [id number] DOB: [birth year]   Cancelled Treatment:    Reason Eval/Treat Not Completed: Active bedrest order. Will assess when medically appropriate/activity order updated.   Thornell Mule, OT/L   Acute OT Clinical Specialist Acute Rehabilitation Services Pager [phone] Office [phone]  02/24/2020, 6:51 AM

## 2020-02-25 LAB — BASIC METABOLIC PANEL
Anion gap: 10 (ref 5–15)
BUN: 6 mg/dL — ABNORMAL LOW (ref 8–23)
CO2: 25 mmol/L (ref 22–32)
Calcium: 9.8 mg/dL (ref 8.9–10.3)
Chloride: 103 mmol/L (ref 98–111)
Creatinine, Ser: 0.76 mg/dL (ref 0.44–1.00)
GFR calc Af Amer: >60 mL/min (ref >60)
GFR calc non Af Amer: >60 mL/min (ref >60)
Glucose, Bld: 107 mg/dL — ABNORMAL HIGH (ref 70–99)
Potassium: 4.4 mmol/L (ref 3.5–5.1)
Sodium: 138 mmol/L (ref 135–145)

## 2020-02-25 LAB — CBC
HCT: 41.6 % (ref 36.0–46.0)
Hemoglobin: 13.2 g/dL (ref 12.0–15.0)
MCH: 28.2 pg (ref 26.0–34.0)
MCHC: 31.7 g/dL (ref 30.0–36.0)
MCV: 88.9 fL (ref 80.0–100.0)
Platelets: 293 10*3/uL (ref 150–400)
RBC: 4.68 MIL/uL (ref 3.87–5.11)
RDW: 14 % (ref 11.5–15.5)
WBC: 6.7 10*3/uL (ref 4.0–10.5)
nRBC: 0 % (ref 0.0–0.2)

## 2020-02-25 NOTE — Progress Notes (Signed)
STROKE TEAM PROGRESS NOTE   SUBJECTIVE (INTERVAL HISTORY) Patient is lying comfortably in bed.  She has no complaints.  She continues to have left-sided peripheral vision loss.  Blood pressure adequately controlled.  Vital signs stable.  EEG shows right posterior slowing but no epileptiform activity.  OBJECTIVE Temp:  [98.2 F (36.8 C)-98.8 F (37.1 C)] 98.2 F (36.8 C) (07/17 0402) Pulse Rate:  [70-101] 80 (07/17 0727) Cardiac Rhythm: Normal sinus rhythm (07/17 0700) Resp:  [12-21] 13 (07/17 0727) BP: (104-151)/(60-94) 129/60 (07/17 0727) SpO2:  [96 %-100 %] 98 % (07/17 0727)  Recent Labs  Lab 02/23/20 1551  GLUCAP 106*   Recent Labs  Lab 02/23/20 1629 02/24/20 0805  NA 137 140  K 4.5 3.8  CL 100 105  CO2 27 25  GLUCOSE 119* 111*  BUN 10 8  CREATININE 0.71 0.70  CALCIUM 9.4 9.7   Recent Labs  Lab 02/23/20 1629  AST 17  ALT 20  ALKPHOS 70  BILITOT 0.5  PROT 8.2*  ALBUMIN 4.3   Recent Labs  Lab 02/23/20 1629 02/24/20 0805  WBC 7.5 7.0  NEUTROABS 5.0  --   HGB 13.7 13.1  HCT 42.0 40.9  MCV 87.1 89.1  PLT 269 261   No results for input(s): CKTOTAL, CKMB, CKMBINDEX, TROPONINI in the last 168 hours. No results for input(s): LABPROT, INR in the last 72 hours. No results for input(s): COLORURINE, LABSPEC, PHURINE, GLUCOSEU, HGBUR, BILIRUBINUR, KETONESUR, PROTEINUR, UROBILINOGEN, NITRITE, LEUKOCYTESUR in the last 72 hours.  Invalid input(s): APPERANCEUR     Component Value Date/Time   CHOL 204 (H) 02/24/2020 0805   TRIG 140 02/24/2020 0805   HDL 54 02/24/2020 0805   CHOLHDL 3.8 02/24/2020 0805   VLDL 28 02/24/2020 0805   LDLCALC 122 (H) 02/24/2020 0805   Lab Results  Component Value Date   HGBA1C 5.7 (H) 02/24/2020      Component Value Date/Time   LABOPIA NONE DETECTED 01/13/2020 1850   COCAINSCRNUR NONE DETECTED 01/13/2020 1850   LABBENZ NONE DETECTED 01/13/2020 1850   AMPHETMU NONE DETECTED 01/13/2020 1850   THCU NONE DETECTED 01/13/2020 1850    LABBARB NONE DETECTED 01/13/2020 1850    No results for input(s): ETH in the last 168 hours.  I have personally reviewed the radiological images below and agree with the radiology interpretations.  CT Angio Head W or Wo Contrast  Result Date: 02/23/2020 CLINICAL DATA:  Cerebral hemorrhage suspected. EXAM: CT ANGIOGRAPHY HEAD AND NECK TECHNIQUE: Multidetector CT imaging of the head and neck was performed using the standard protocol during bolus administration of intravenous contrast. Multiplanar CT image reconstructions and MIPs were obtained to evaluate the vascular anatomy. Carotid stenosis measurements (when applicable) are obtained utilizing NASCET criteria, using the distal internal carotid diameter as the denominator. CONTRAST:  OMNIPAQUE IOHEXOL 350 MG/ML SOLN COMPARISON:  Noncontrast head CT performed earlier the same day 02/23/2019, MRI/MRA head 01/14/2020, MRA neck 01/14/2020. FINDINGS: CTA NECK FINDINGS Aortic arch: Common origin of the innominate and left common carotid arteries. Mild atherosclerotic plaque within the visualized aortic arch and proximal major branch vessels of the neck. No hemodynamically significant innominate or proximal subclavian artery stenosis. Right carotid system: CCA and ICA patent within the neck without significant stenosis (50% or greater). Mild calcified plaque within the proximal ICA Left carotid system: CCA and ICA patent within the neck without significant stenosis (50% or greater). Mild calcified plaque within the proximal ICA. Vertebral arteries: Codominant and patent within the neck without significant  stenosis. Skeleton: No acute bony abnormality. Cervical spondylosis without high-grade bony spinal canal narrowing. C4-C5 grade 1 anterolisthesis. Other neck: No neck mass or cervical lymphadenopathy. Upper chest: No consolidation within the imaged lung apices. Review of the MIP images confirms the above findings CTA HEAD FINDINGS Anterior circulation:  The intracranial internal carotid arteries are patent. Mild atherosclerotic narrowing of the cavernous segment on the right. The M1 middle cerebral arteries are patent without significant stenosis. No M2 proximal branch occlusion or high-grade proximal stenosis is identified. The anterior cerebral arteries are patent. No aneurysm or arteriovenous malformation is identified. Posterior circulation: The intracranial vertebral arteries are patent without significant stenosis, as is the basilar artery. The posterior cerebral arteries are patent without significant proximal stenosis. A small left posterior communicating artery is present. The right posterior communicating artery is hypoplastic or absent. Venous sinuses: Within limitations of contrast timing, no convincing thrombus. Anatomic variants: As described Review of the MIP images confirms the above findings IMPRESSION: CTA neck: The common carotid, internal carotid and vertebral arteries are patent within the neck without hemodynamically significant stenosis. Mild atherosclerotic disease as described. CTA head: 1. No intracranial aneurysm or AVM is identified. Follow-up CT or MR angiography following resolution of the right parietal hematoma can be considered for further evaluation, although no aneurysm or AVM was identified on the prior MRA of 01/14/2020. 2. No intracranial large vessel occlusion or proximal high-grade arterial stenosis. Electronically Signed   By: Jackey Loge DO   On: 02/23/2020 17:32   CT HEAD WO CONTRAST  Result Date: 02/24/2020 CLINICAL DATA:  Intracranial hemorrhage, follow-up EXAM: CT HEAD WITHOUT CONTRAST TECHNIQUE: Contiguous axial images were obtained from the base of the skull through the vertex without intravenous contrast. COMPARISON:  02/23/2020 FINDINGS: Brain: Parenchymal hemorrhage centered within the right parietal lobe again identified with surrounding edema and mild regional mass effect. There is no new hemorrhage or  evidence of intraventricular extension. Gray-white differentiation is preserved. No hydrocephalus. Vascular: No new finding. Skull: Calvarium is unremarkable. Sinuses/Orbits: No acute finding. Other: None. IMPRESSION: Similar appearance of parenchymal hemorrhage centered within the right parietal lobe. No new hemorrhage or worsening mass effect. Electronically Signed   By: Guadlupe Spanish M.D.   On: 02/24/2020 18:26   CT Head Wo Contrast  Result Date: 02/23/2020 CLINICAL DATA:  Headache, forehead pain for 2 days, near syncope EXAM: CT HEAD WITHOUT CONTRAST TECHNIQUE: Contiguous axial images were obtained from the base of the skull through the vertex without intravenous contrast. COMPARISON:  01/13/2020 FINDINGS: Brain: There is a new area of intraparenchymal hemorrhage within the right parietal convexity, measuring 4.7 x 3.8 cm in transverse direction and extending approximately 4.8 cm in craniocaudal length. There is significant surrounding edema, with effacement of the right lateral ventricle. Basilar cisterns are preserved. There is no midline shift. Vascular: No hyperdense vessel or unexpected calcification. Skull: Normal. Negative for fracture or focal lesion. Sinuses/Orbits: No acute finding. Other: None. IMPRESSION: 1. Large right parietal intraparenchymal hemorrhage measuring 4.8 x 4.7 x 3.8 cm. Significant surrounding edema with effacement of the right lateral ventricle. These results were called by telephone at the time of interpretation on 02/23/2020 at 3:59 pm to provider DAN FLOYD , who verbally acknowledged these results. Electronically Signed   By: Sharlet Salina M.D.   On: 02/23/2020 16:05   CT Angio Neck W and/or Wo Contrast  Result Date: 02/23/2020 CLINICAL DATA:  Cerebral hemorrhage suspected. EXAM: CT ANGIOGRAPHY HEAD AND NECK TECHNIQUE: Multidetector CT imaging of the head  and neck was performed using the standard protocol during bolus administration of intravenous contrast. Multiplanar CT  image reconstructions and MIPs were obtained to evaluate the vascular anatomy. Carotid stenosis measurements (when applicable) are obtained utilizing NASCET criteria, using the distal internal carotid diameter as the denominator. CONTRAST:  OMNIPAQUE IOHEXOL 350 MG/ML SOLN COMPARISON:  Noncontrast head CT performed earlier the same day 02/23/2019, MRI/MRA head 01/14/2020, MRA neck 01/14/2020. FINDINGS: CTA NECK FINDINGS Aortic arch: Common origin of the innominate and left common carotid arteries. Mild atherosclerotic plaque within the visualized aortic arch and proximal major branch vessels of the neck. No hemodynamically significant innominate or proximal subclavian artery stenosis. Right carotid system: CCA and ICA patent within the neck without significant stenosis (50% or greater). Mild calcified plaque within the proximal ICA Left carotid system: CCA and ICA patent within the neck without significant stenosis (50% or greater). Mild calcified plaque within the proximal ICA. Vertebral arteries: Codominant and patent within the neck without significant stenosis. Skeleton: No acute bony abnormality. Cervical spondylosis without high-grade bony spinal canal narrowing. C4-C5 grade 1 anterolisthesis. Other neck: No neck mass or cervical lymphadenopathy. Upper chest: No consolidation within the imaged lung apices. Review of the MIP images confirms the above findings CTA HEAD FINDINGS Anterior circulation: The intracranial internal carotid arteries are patent. Mild atherosclerotic narrowing of the cavernous segment on the right. The M1 middle cerebral arteries are patent without significant stenosis. No M2 proximal branch occlusion or high-grade proximal stenosis is identified. The anterior cerebral arteries are patent. No aneurysm or arteriovenous malformation is identified. Posterior circulation: The intracranial vertebral arteries are patent without significant stenosis, as is the basilar artery. The posterior  cerebral arteries are patent without significant proximal stenosis. A small left posterior communicating artery is present. The right posterior communicating artery is hypoplastic or absent. Venous sinuses: Within limitations of contrast timing, no convincing thrombus. Anatomic variants: As described Review of the MIP images confirms the above findings IMPRESSION: CTA neck: The common carotid, internal carotid and vertebral arteries are patent within the neck without hemodynamically significant stenosis. Mild atherosclerotic disease as described. CTA head: 1. No intracranial aneurysm or AVM is identified. Follow-up CT or MR angiography following resolution of the right parietal hematoma can be considered for further evaluation, although no aneurysm or AVM was identified on the prior MRA of 01/14/2020. 2. No intracranial large vessel occlusion or proximal high-grade arterial stenosis. Electronically Signed   By: Jackey Loge DO   On: 02/23/2020 17:32   EEG adult  Result Date: 02/24/2020 Charlsie Quest, MD     02/24/2020 10:32 AM Patient Name: Stacy Flores MRN: [id number] Epilepsy Attending: Charlsie Quest Referring Physician/Provider: Dr Caryl Pina Date: 02/24/2020 Duration: 23.32 mins Patient history: 64 year old female with h/o seizures now with acute right parietal-frontal ICH. EEG to evaluate for seizure. Level of alertness: Awake, asleep AEDs during EEG study: LEV Technical aspects: This EEG study was done with scalp electrodes positioned according to the 10-20 International system of electrode placement. Electrical activity was acquired at a sampling rate of 500Hz  and reviewed with a high frequency filter of 70Hz  and a low frequency filter of 1Hz . EEG data were recorded continuously and digitally stored. Description: The posterior dominant rhythm consists of 9 Hz activity of moderate voltage (25-35 uV) seen predominantly in posterior head regions, asymmetric ( R<L) and reactive to eye opening and  eye closing. Drowsiness was characterized by attenuation of the posterior background rhythm. Sleep was characterized by vertex  waves, sleep spindles (12 to 14 Hz), maximal frontocentral region.  EEG showed intermittent generalized 2-3 Hz delta slowing as well as continuous 3-6hz  theta-delta slowing in right hemisphere, maximal right posterior quadrant. Sharp transients were also seen in right parietal region.  Hyperventilation and photic stimulation were not performed.   ABNORMALITY -Intermittent slow, generalized -Continuous slow, right hemisphere, maximal right posterior quadrant - Background asymmetry, R<L IMPRESSION: This study is suggestive of cortical dysfunction in right hemisphere, maximal right posterior quadrant likely secondary to underlying hemorrhage as well as mild diffuse encephalopathy, nonspecific etiology. No seizures or definite epileptiform discharges were seen throughout the recording. Charlsie Quest   ECHOCARDIOGRAM COMPLETE  Result Date: 02/24/2020    ECHOCARDIOGRAM REPORT   Patient Name:   Stacy Flores Date of Exam: 02/24/2020 Medical Rec #:  [id number]          Height:       63.0 in Accession #:    [id number]         Weight:       159.0 lb Date of Birth:  1956-03-18         BSA:          1.754 m Patient Age:    63 years           BP:           129/79 mmHg Patient Gender: F                  HR:           72 bpm. Exam Location:  Inpatient Procedure: 2D Echo Indications:    stroke 434.91  History:        Patient has prior history of Echocardiogram examinations, most                 recent 07/23/2019. Risk Factors:Hypertension and Dyslipidemia.  Sonographer:    Delcie Roch Referring Phys: [id number] JINDONG XU IMPRESSIONS  1. Left ventricular ejection fraction, by estimation, is 60 to 65%. The left ventricle has normal function. The left ventricle has no regional wall motion abnormalities. Left ventricular diastolic parameters are consistent with Grade I diastolic dysfunction  (impaired relaxation).  2. Right ventricular systolic function is normal. The right ventricular size is normal.  3. The mitral valve is normal in structure. Trivial mitral valve regurgitation. No evidence of mitral stenosis.  4. The aortic valve is normal in structure. Aortic valve regurgitation is not visualized. No aortic stenosis is present.  5. The inferior vena cava is normal in size with greater than 50% respiratory variability, suggesting right atrial pressure of 3 mmHg. Conclusion(s)/Recommendation(s): No intracardiac source of embolism detected on this transthoracic study. A transesophageal echocardiogram is recommended to exclude cardiac source of embolism if clinically indicated. FINDINGS  Left Ventricle: Left ventricular ejection fraction, by estimation, is 60 to 65%. The left ventricle has normal function. The left ventricle has no regional wall motion abnormalities. The left ventricular internal cavity size was normal in size. There is  no left ventricular hypertrophy. Left ventricular diastolic parameters are consistent with Grade I diastolic dysfunction (impaired relaxation). Right Ventricle: The right ventricular size is normal. No increase in right ventricular wall thickness. Right ventricular systolic function is normal. Left Atrium: Left atrial size was normal in size. Right Atrium: Right atrial size was normal in size. Pericardium: There is no evidence of pericardial effusion. Mitral Valve: The mitral valve is normal in structure. Normal mobility of the mitral valve leaflets. Trivial mitral  valve regurgitation. No evidence of mitral valve stenosis. Tricuspid Valve: The tricuspid valve is normal in structure. Tricuspid valve regurgitation is not demonstrated. No evidence of tricuspid stenosis. Aortic Valve: The aortic valve is normal in structure. Aortic valve regurgitation is not visualized. No aortic stenosis is present. Pulmonic Valve: The pulmonic valve was normal in structure. Pulmonic valve  regurgitation is not visualized. No evidence of pulmonic stenosis. Aorta: The aortic root is normal in size and structure. Venous: The inferior vena cava is normal in size with greater than 50% respiratory variability, suggesting right atrial pressure of 3 mmHg. IAS/Shunts: No atrial level shunt detected by color flow Doppler.  LEFT VENTRICLE PLAX 2D LVIDd:         4.60 cm LVIDs:         3.40 cm LV PW:         0.90 cm LV IVS:        0.70 cm LVOT diam:     2.10 cm LV SV:         69 LV SV Index:   39 LVOT Area:     3.46 cm  RIGHT VENTRICLE             IVC RV S prime:     14.70 cm/s  IVC diam: 1.30 cm TAPSE (M-mode): 1.7 cm LEFT ATRIUM             Index       RIGHT ATRIUM           Index LA diam:        3.30 cm 1.88 cm/m  RA Area:     10.20 cm LA Vol (A2C):   50.4 ml 28.73 ml/m RA Volume:   22.00 ml  12.54 ml/m LA Vol (A4C):   37.1 ml 21.15 ml/m LA Biplane Vol: 44.4 ml 25.31 ml/m  AORTIC VALVE LVOT Vmax:   96.00 cm/s LVOT Vmean:  64.800 cm/s LVOT VTI:    0.198 m  AORTA Ao Asc diam: 2.80 cm MITRAL VALVE MV Area (PHT): 2.99 cm    SHUNTS MV Decel Time: 254 msec    Systemic VTI:  0.20 m MV E velocity: 64.30 cm/s  Systemic Diam: 2.10 cm MV A velocity: 80.10 cm/s MV E/A ratio:  0.80 Donato Schultz MD Electronically signed by Donato Schultz MD Signature Date/Time: 02/24/2020/12:58:03 PM    Final    CT VENOGRAM HEAD  Result Date: 02/23/2020 CLINICAL DATA:  Provided history: Brain bleed. EXAM: CT VENOGRAM HEAD TECHNIQUE: CT venography of the head was performed following the administration 100 mL Omnipaque 350 intravenous contrast. Coronal and sagittal reconstructions were also submitted for evaluation. Additionally, axial, coronal and sagittal MIP reconstructions were submitted for evaluation. CONTRAST:  OMNIPAQUE IOHEXOL 350 MG/ML SOLN COMPARISON:  Same day non-contrast head CT and CT angiogram head/neck 02/23/2020 FINDINGS: The superior sagittal sinus, internal cerebral veins, vein of Galen, straight sinus,  transverse sinuses, sigmoid sinuses and visualized jugular veins are patent. There is no appreciable intracranial venous thrombosis. A large right parietal parenchymal hematoma is grossly unchanged in size. Redemonstrated effacement of the posterior right lateral ventricle. No midline shift. No abnormal parenchymal enhancement is identified. IMPRESSION: No evidence of intracranial venous thrombosis. Electronically Signed   By: Jackey Loge DO   On: 02/23/2020 17:41       PHYSICAL EXAM   Pleasant middle-aged African-American lady Temp:  [98.2 F (36.8 C)-98.8 F (37.1 C)] 98.2 F (36.8 C) (07/17 0402) Pulse Rate:  [70-101] 80 (07/17  16100727) Resp:  [12-21] 13 (07/17 0727) BP: (104-151)/(60-94) 129/60 (07/17 0727) SpO2:  [96 %-100 %] 98 % (07/17 0727)  General -mildly obese middle-aged African-American lady, in no apparent distress.  Ophthalmologic - fundi not visualized due to noncooperation.  Cardiovascular - Regular rhythm and rate.  Mental Status -  Level of arousal and orientation to time, place, and person were intact. Language including expression, naming, repetition, comprehension was assessed and found intact.  Cranial Nerves II - XII - II - left homonymous hemianopia. III, IV, VI - Extraocular movements intact except  . Chronic left "lazy eye" per pt V - Facial sensation intact bilaterally. VII - left nasolabial fold mild flattening. VIII - Hearing & vestibular intact bilaterally. X - Palate elevates symmetrically. XI - Chin turning & shoulder shrug intact bilaterally. XII - Tongue protrusion intact.  Motor Strength - The patient's strength was normal in all extremities and pronator drift was absent.  Bulk was normal and fasciculations were absent.   Motor Tone - Muscle tone was assessed at the neck and appendages and was normal.  Reflexes - The patient's reflexes were symmetrical in all extremities and she had no pathological reflexes.  Sensory - Light touch,  temperature/pinprick were assessed and were symmetrical.    Coordination -intact finger-to-nose and knee to heel coordination.     Gait and Station - deferred.   ASSESSMENT/PLAN Stacy Flores is a 64 y.o. female with history of ICH, CAA, seizure admitted for HA, confusion, speaking slower than before. No tPA given due to ICH.     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 No cardiac source of emboli identified.  LDL 122  HgbA1c 5.7  CT repeat 02/24/20 - Similar appearance of parenchymal hemorrhage centered within the bright parietal lobe. No new hemorrhage or worsening mass effect.  SCDs for VTE prophylaxis  EEG 02/24/20 - This study is suggestive of cortical dysfunction in right hemisphere, maximal right posterior quadrant likely secondary to underlying hemorrhage as well as mild diffuse encephalopathy, nonspecific etiology. No seizures or definite epileptiform discharges were seen throughout the recording.  No antithrombotic prior to admission, now on No antithrombotic due to CAA and ICH  Ongoing aggressive stroke risk factor management  Therapy recommendations:  HH PT recommended - OT evaluation pending  Disposition:  pending  CAA and ICH  07/2019 right frontoparietal WM ICH - adjacent to current hematoma - MRA neg for AVM or aneurysm but consistent with CAA. CUS neg. EF 60-65%. LDL 93 and A1C 5.4. UDS neg. Recommend no antithrombotics and keep BP < 140.   Seizure  01/2020 admitted for seizure activity  EEG 01/14/20 no seizure, cortical dysfunction in right hemisphere, maximal right temporo-parietal region   On keppra 750 bid  This admission EEG - suggestive of cortical dysfunction in right hemisphere, maximal right posterior quadrant likely  secondary to underlying hemorrhage as well as mild diffuse encephalopathy, nonspecific etiology. No seizures or definite epileptiform discharges were seen throughout the recording.  Continue keppra  BP management . BP 130s . Home meds amlodipine 5 . Now on amlodipine 10 . 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  Recommend mobilize out of bed.  Therapy consults.  Transfer to neurology floor bed today.  If no therapy needs will consider discharge in the next couple of days.  No family available for discussion.  Greater than 50% time during the 35-minute visit was spent in counseling and coordination of care and discussion with care team. Delia Heady, MD    To contact Stroke Continuity provider, please refer to [email or web address]. After hours, contact General Neurology

## 2020-02-25 NOTE — Progress Notes (Signed)
Patient was transferred to 4NP011 from 4N ICU in a wheelchair at 2300 on 7/16. The patient was transferred to the bed, admitted to tele, and assessed. Vitals were obtained and PRN hydralazine was administered to maintain SBP goal <130. Patient oriented to room, call bell placed within reach and bed alarm set. Patient resting in bed.

## 2020-02-25 NOTE — Evaluation (Signed)
Speech Language Pathology Evaluation Patient Details Name: Stacy Flores MRN: [id number] DOB: Oct 31, 1955 Today's Date: 02/25/2020 Time: 2130-8657 SLP Time Calculation (min) (ACUTE ONLY): 23 min  Problem List:  Patient Active Problem List   Diagnosis Date Noted  . Muscle twitching 01/14/2020  . Weakness of left upper extremity 01/13/2020  . Cerebral amyloid angiopathy (CODE) 07/25/2019  . Essential hypertension 07/25/2019  . Hyperlipemia 07/25/2019  . 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 ICH (intracerebral hemorrhage) (HCC) 07/21/2019   Past Medical History:  Past Medical History:  Diagnosis Date  . Hypertension   . Stroke [facility])    Past Surgical History:  Past Surgical History:  Procedure Laterality Date  . ABDOMINAL HYSTERECTOMY    . TONSILLECTOMY     HPI:  64 y.o. female with a history of HTN and prior ICH in December of 2020 with residual chronic left sided weakness, recent second ICH in June of this year, prior seizures, who presented to [facility] with a two day history of bifrontal headache in conjunction with confusion. She also complained of LUE shaking the night before, followed by confusion, that was concerning for seizure. Family felt that they saw some right-sided facial drooping which was new. CT head at [facility] revealed a large right parietal intraparenchymal hemorrhage with significant edema and effacement of the R lateral ventricle.   Assessment / Plan / Recommendation Clinical Impression  Pt presents with cognitive impairments as indicated by a score of 18/30 on the SLUMS. Some of her errors are more consistent with previously documented testing (mild cognitive deficits noted in December 2020 per results of COGNISTAT), but she also presents now with decreased intellectual awareness of acute changes from current stroke and is having difficulty remembering daily events (no recall of having PT on previous date, does not remember getting  OOB). Mildly complex problem solving is impaired with some emergent awareness but with limited self-correction. She does believe that she can have 24/7 support from her family upon discharge and that her daughters already help with her finances/medication management. Will continue to follow to increase independence and safety.    SLP Assessment  SLP Recommendation/Assessment: Patient needs continued Speech Lanaguage Pathology Services SLP Visit Diagnosis: Cognitive communication deficit (R41.841)    Follow Up Recommendations  Home health SLP;24 hour supervision/assistance    Frequency and Duration min 2x/week  2 weeks      SLP Evaluation Cognition  Overall Cognitive Status: No family/caregiver present to determine baseline cognitive functioning Arousal/Alertness: Awake/alert Orientation Level: Oriented X4 Attention: Sustained Sustained Attention: Appears intact Memory: Impaired Memory Impairment: Decreased recall of new information Awareness: Impaired Awareness Impairment: Intellectual impairment;Emergent impairment;Anticipatory impairment Problem Solving: Impaired Problem Solving Impairment: Verbal complex;Functional complex Safety/Judgment: Impaired       Comprehension  Auditory Comprehension Overall Auditory Comprehension: Appears within functional limits for tasks assessed    Expression Expression Primary Mode of Expression: Verbal Verbal Expression Overall Verbal Expression: Appears within functional limits for tasks assessed   Oral / Motor  Motor Speech Overall Motor Speech: Appears within functional limits for tasks assessed   GO                    Mahala Menghini., M.A. CCC-SLP Acute Rehabilitation Services Pager [phone] Office [phone]  02/25/2020, 9:43 AM

## 2020-02-25 NOTE — Discharge Summary (Addendum)
Patient ID: Stacy Flores    l   MRN: [id number]      DOB: 06/29/1956  Date of Admission: 02/23/2020 Date of Discharge: 02/26/2020  Attending Physician:  Stacy Flores, Stacy Kerwin S, MD, Stroke MD Consultant(Flores):    None  Patient'Flores PCP:  Drosinis, Stacy Readeronna J, PA-C  DISCHARGE DIAGNOSIS:  Active Problems: ICH (intracerebral hemorrhage) (HCC) - Right frontoparietal ICH, 40 cc'Flores measuring 4.8 x 4.7 x 3.8 cm. Hypertension SBP goal < 130 mm Hg Cerebral Amyloid Angiopathy (CAA) Seizure disorder Dyslipidemia - no statin recommended due to CAA Hyperglycemia    Past Medical History:  Diagnosis Date   Hypertension    Stroke [facility](HCC)    Past Surgical History:  Procedure Laterality Date   ABDOMINAL HYSTERECTOMY     TONSILLECTOMY      Family History No family history on file.  Social History  reports that she has quit smoking. She has never used smokeless tobacco. She reports previous alcohol use. She reports that she does not use drugs.  Allergies as of 02/26/2020   No Known Allergies      Medication List     TAKE these medications    amLODipine 10 MG tablet Commonly known as: NORVASC Take 1 tablet (10 mg total) by mouth daily. Start taking on: February 27, 2020 What changed:  medication strength how much to take   levETIRAcetam 750 MG tablet Commonly known as: KEPPRA Take 1 tablet (750 mg total) by mouth 2 (two) times daily.        HOME MEDICATIONS PRIOR TO ADMISSION Medications Prior to Admission  Medication Sig Dispense Refill   amLODipine (NORVASC) 5 MG tablet Take 5 mg by mouth daily.     [DISCONTINUED] levETIRAcetam (KEPPRA) 750 MG tablet Take 1 tablet (750 mg total) by mouth 2 (two) times daily. 60 tablet 0     HOSPITAL MEDICATIONS  amLODipine  10 mg Oral Daily   Chlorhexidine Gluconate Cloth  6 each Topical Q0600   levETIRAcetam  750 mg Oral BID   pantoprazole  40 mg Oral Daily   senna-docusate  1 tablet Oral BID    LABORATORY STUDIES CBC    Component Value  Date/Time   WBC 7.8 02/26/2020 0406   RBC 4.20 02/26/2020 0406   HGB 11.7 (L) 02/26/2020 0406   HCT 37.3 02/26/2020 0406   PLT 282 02/26/2020 0406   MCV 88.8 02/26/2020 0406   MCH 27.9 02/26/2020 0406   MCHC 31.4 02/26/2020 0406   RDW 14.0 02/26/2020 0406   LYMPHSABS 1.8 02/23/2020 1629   MONOABS 0.6 02/23/2020 1629   EOSABS 0.0 02/23/2020 1629   BASOSABS 0.0 02/23/2020 1629   CMP    Component Value Date/Time   NA 138 02/26/2020 0406   K 3.8 02/26/2020 0406   CL 103 02/26/2020 0406   CO2 27 02/26/2020 0406   GLUCOSE 110 (H) 02/26/2020 0406   BUN 15 02/26/2020 0406   CREATININE 0.89 02/26/2020 0406   CALCIUM 9.7 02/26/2020 0406   PROT 8.2 (H) 02/23/2020 1629   ALBUMIN 4.3 02/23/2020 1629   AST 17 02/23/2020 1629   ALT 20 02/23/2020 1629   ALKPHOS 70 02/23/2020 1629   BILITOT 0.5 02/23/2020 1629   GFRNONAA >60 02/26/2020 0406   GFRAA >60 02/26/2020 0406   COAGS Lab Results  Component Value Date   INR 1.0 01/13/2020   INR 1.0 07/21/2019   Lipid Panel    Component Value Date/Time   CHOL 204 (H) 02/24/2020 0805   TRIG 140  02/24/2020 0805   HDL 54 02/24/2020 0805   CHOLHDL 3.8 02/24/2020 0805   VLDL 28 02/24/2020 0805   LDLCALC 122 (H) 02/24/2020 0805   HgbA1C  Lab Results  Component Value Date   HGBA1C 5.7 (H) 02/24/2020   Urinalysis    Component Value Date/Time   COLORURINE YELLOW 01/13/2020 1850   APPEARANCEUR CLEAR 01/13/2020 1850   LABSPEC <1.005 (L) 01/13/2020 1850   PHURINE 6.0 01/13/2020 1850   GLUCOSEU NEGATIVE 01/13/2020 1850   HGBUR NEGATIVE 01/13/2020 1850   BILIRUBINUR NEGATIVE 01/13/2020 1850   KETONESUR NEGATIVE 01/13/2020 1850   PROTEINUR NEGATIVE 01/13/2020 1850   NITRITE NEGATIVE 01/13/2020 1850   LEUKOCYTESUR NEGATIVE 01/13/2020 1850   Urine Drug Screen     Component Value Date/Time   LABOPIA NONE DETECTED 01/13/2020 1850   COCAINSCRNUR NONE DETECTED 01/13/2020 1850   LABBENZ NONE DETECTED 01/13/2020 1850   AMPHETMU NONE  DETECTED 01/13/2020 1850   THCU NONE DETECTED 01/13/2020 1850   LABBARB NONE DETECTED 01/13/2020 1850    Alcohol Level    Component Value Date/Time   ETH <10 01/13/2020 1902     SIGNIFICANT DIAGNOSTIC STUDIES  CT Angio Head W or Wo Contrast CT Angio Neck W and/or Wo Contrast 02/23/2020 IMPRESSION:  CTA neck:  The common carotid, internal carotid and vertebral arteries are patent within the neck without hemodynamically significant stenosis. Mild atherosclerotic disease as described.  CTA head:  1. No intracranial aneurysm or AVM is identified. Follow-up CT or MR angiography following resolution of the right parietal hematoma can be considered for further evaluation, although no aneurysm or AVM was identified on the prior MRA of 01/14/2020.  2. No intracranial large vessel occlusion or proximal high-grade arterial stenosis.   CT HEAD WO CONTRAST 02/24/2020 IMPRESSION:  Similar appearance of parenchymal hemorrhage centered within the right parietal lobe. No new hemorrhage or worsening mass effect.  CT Head Wo Contrast 02/23/2020 IMPRESSION:  Large right parietal intraparenchymal hemorrhage measuring 4.8 x 4.7 x 3.8 cm. Significant surrounding edema with effacement of the right lateral ventricle.   EEG adult 02/24/2020 IMPRESSION:  This study is suggestive of cortical dysfunction in right hemisphere, maximal right posterior quadrant likely secondary to underlying hemorrhage as well as mild diffuse encephalopathy, nonspecific etiology. No seizures or definite epileptiform discharges were seen throughout the recording.   ECHOCARDIOGRAM COMPLETE 02/24/2020 IMPRESSIONS   1. Left ventricular ejection fraction, by estimation, is 60 to 65%. The left ventricle has normal function. The left ventricle has no regional wall motion abnormalities. Left ventricular diastolic parameters are consistent with Grade I diastolic dysfunction (impaired relaxation).   2. Right ventricular systolic function  is normal. The right ventricular size is normal.   3. The mitral valve is normal in structure. Trivial mitral valve regurgitation. No evidence of mitral stenosis.   4. The aortic valve is normal in structure. Aortic valve regurgitation is not visualized. No aortic stenosis is present.   5. The inferior vena cava is normal in size with greater than 50% respiratory variability, suggesting right atrial pressure of 3 mmHg. Conclusion(Flores)/Recommendation(Flores): No intracardiac source of embolism detected on this transthoracic study. A transesophageal echocardiogram is recommended to exclude cardiac source of embolism if clinically indicated.   CT VENOGRAM HEAD 02/23/2020 IMPRESSION:  No evidence of intracranial venous thrombosis.      HISTORY OF PRESENT ILLNESS (From Dr Shelbie Hutching H&P 02/23/20) Stacy Flores is a 64 y.o. female with a history of HTN, prior ICH in December of 2020 with residual chronic  left sided weakness, recent second ICH in June of this year, and prior seizures, who presented to [facility] with a two day history of bifrontal headache in conjunction with confusion. She also complained of LUE shaking the night before, followed by confusion, that was concerning for seizure. Family felt that they saw some right-sided facial drooping which was new. Regarding the patient'Flores confusion, her daughter noted her to have put her bra on top of her shirt when getting dressed and was speaking slower than normal. Shortly after arriving to [facility], the patient was alert and oriented. The patient was admitted to the Neuro Intensive Care Unit.  HOSPITAL COURSE [name] is a 64 y.o. female with history of ICH, CAA (cerebral amyloid angiopathy), and seizure admitted for HA, confusion, speaking slower than before. No tPA given due to ICH. She was admitted to the Neuro Intensive Care Unit.   ICH:  Right frontoparietal para-falcine ICH, likely due to known CAA with recurrence adjacent to a previous  hemorrhage  CT head right frontoparietal ICH, 40 cc'Flores measuring 4.8 x 4.7 x 3.8 cm. CTA head and neck unremarkable, no AVM or aneurysm CTV negative for SCVT 2D Echo - EF 60 - 65%. No cardiac source of emboli identified. SARS Corona Virus - negative LDL 122 HgbA1c 5.7 CT repeat 02/24/20 - Similar appearance of parenchymal hemorrhage centered within the bright parietal lobe. No new hemorrhage or worsening mass effect. SCDs for VTE prophylaxis EEG 02/24/20 - This study is suggestive of cortical dysfunction in right hemisphere, maximal right posterior quadrant likely secondary to underlying hemorrhage as well as mild diffuse encephalopathy, nonspecific etiology. No seizures or definite epileptiform discharges were seen throughout the recording. No antithrombotic prior to admission, now on No antithrombotic due to CAA and ICH Ongoing aggressive stroke risk factor management Therapy recommendations:  HH PT recommended - OT evaluation pending Disposition:  discharge to home with supervision for safety.   CAA and ICH 07/2019 right frontoparietal WM ICH - adjacent to current hematoma - MRA neg for AVM or aneurysm but consistent with CAA. CUS neg. EF 60-65%. LDL 93 and A1C 5.4. UDS neg. Recommend no antithrombotics and keep BP < 130.    Seizure 01/2020 admitted for seizure activity EEG 01/14/20 no seizure, cortical dysfunction in right hemisphere, maximal right temporo-parietal region  On keppra 750 bid This admission EEG - suggestive of cortical dysfunction in right hemisphere, maximal right posterior quadrant likely secondary to underlying hemorrhage as well as mild diffuse encephalopathy, nonspecific etiology. No seizures or definite epileptiform discharges were seen throughout the recording. Continue keppra   BP management BP 130s Home meds amlodipine 5 Now on amlodipine 10 Hydralazine PRN Given recurrent ICH due to CAA, will recommend long term BP goal < 130   Hyperlipidemia Home meds:  none   LDL 122, goal < 70 Recommend no statin due to CAA (cerebral amyloid angiopathy) with recurrent ICH (intracerebral hemorrhage)   Other Active Problems  Mild hyperglycemia - outpatient f/u   DISCHARGE EXAM Vitals:   02/25/20 2005 02/25/20 2353 02/26/20 0449 02/26/20 0827  BP: 113/67 124/80 (!) 120/56 121/76  Pulse: 86 77 72   Resp: Temp: 98.3 F (36.8 C) 98.4 F (36.9 C) 98.6 F (37 C) 98.5 F (36.9 C)  TempSrc: Oral Oral Oral Oral  SpO2: 99% 97% 98%   Weight:      Height:       General -mildly obese middle-aged African-American lady, in no apparent distress.  Ophthalmologic - fundi not visualized due to noncooperation.   Cardiovascular - Regular rhythm and rate.   Mental Status -  Level of arousal and orientation to time, place, and person were intact. Language including expression, naming, repetition, comprehension was assessed and found intact.   Cranial Nerves II - XII - II - left homonymous hemianopia. III, IV, VI - Extraocular movements intact except  . Chronic left "lazy eye" per pt V - Facial sensation intact bilaterally. VII - left nasolabial fold mild flattening. VIII - Hearing & vestibular intact bilaterally. X - Palate elevates symmetrically. XI - Chin turning & shoulder shrug intact bilaterally. XII - Tongue protrusion intact.   Motor Strength - The patient'Flores strength was normal in all extremities and pronator drift was absent.  Bulk was normal and fasciculations were absent.   Motor Tone - Muscle tone was assessed at the neck and appendages and was normal.   Reflexes - The patient'Flores reflexes were symmetrical in all extremities and she had no pathological reflexes.   Sensory - Light touch, temperature/pinprick were assessed and were symmetrical.     Coordination -intact finger-to-nose and knee to heel coordination.      Gait and Station - deferred.  DISCHARGE INSTRUCTIONS GIVEN TO THE PATIENT 1. Your doctors have recommended that your  systolic blood pressure (the top number) be maintained at less than 130. 2. Home therapies have not been recommended at this time due to the significant progress you have made. 3. It is recommended that you have supervision (for safety) initially when you are up out of bed.  4. Gradually increase your activity as tolerated. 5. You are not to have any aspirin or products that contain aspirin. You should avoid Advil, Aleve, Ibuprofen, and similar medications (NSAIDS). Tylenol (acetominophine) may be taken for pain or fever. 6. Your blood sugar was mildly high. Try to avoid sweets. Talk to your primary care provider about further recommendations. 7. Your cholesterol was high but you should not take statin medications because of your history of hemorrhagic stroke. You may need to make changes in your diet. 8. Your Norvasc (amlodipine) medication has been increased from 5 mg daily to 10 mg daily. If you still have 5 mg capsules left you may take two each day, instead of one, until they run out and then switch to the 10 mg strength.  DISCHARGE DIET   Diet Order             Diet Heart Room service appropriate? Yes with Assist; Fluid consistency: Thin  Diet effective now                  liquids  DISCHARGE PLAN Disposition:  Discharge to home. No antithrombotic for secondary stroke prevention due intracerebral hemorrhage (ICH) due to Cerebral Amyloid Angiopathy (CAA) No aspirin products or NSAIDS. Hypertension SBP goal < 130 mm Hg No f/u therapy recommended - supervision initially when up out of bed for safety reasons. Ongoing risk factor control by Primary Care Physician at time of discharge Follow-up Drosinis, Stacy Reader, PA-C in 2 weeks. Follow-up in [facility] in 4 weeks, office to schedule an appointment.   45 minutes were spent preparing discharge.  Delton See PA-C [facility] Pager [phone] 02/26/2020, 11:21 AM  I have  personally obtained history,examined this patient, reviewed notes, independently viewed imaging studies, participated in medical decision making and plan of care.ROS completed by me personally and pertinent positives fully documented  I have made  any additions or clarifications directly to the above note. Agree with note above.   Delia Heady, MD Medical Director [facility] Pager: [phone] 02/26/2020 2:01 PM

## 2020-02-25 NOTE — Progress Notes (Signed)
Physical Therapy Treatment Patient Details Name: Stacy Flores MRN: [id number] DOB: Jan 08, 1956 Today's Date: 02/25/2020    History of Present Illness 64 y.o. female with a history of HTN and prior ICH in December of 2020 with residual chronic left sided weakness, recent second ICH in June of this year, prior seizures, who presented to [facility] with a two day history of bifrontal headache in conjunction with confusion. She also complained of LUE shaking the night before, followed by confusion, that was concerning for seizure. Family felt that they saw some right-sided facial drooping which was new. CT head at [facility] revealed a large right parietal intraparenchymal hemorrhage with significant edema and effacement of the R lateral ventricle.    PT Comments    Pt tolerated treatment well, demonstrating much improved activity tolerance and improve gait/balance quality. Pt denies any symptoms of orthostatic BP this session and reports she feels at her baseline, ambulating without an assistive device for community distances without any significant balance deviations. PT updating recommendations to no PT or DME needs. Pt requires no further acute PT services and pt is in agreement. Pt is encouraged to ambulate outside of the room multiple times per day for the remainder of her hospitalization. Acute PT signing off.   Follow Up Recommendations  No PT follow up     Equipment Recommendations  None recommended by PT    Recommendations for Other Services       Precautions / Restrictions Precautions Precautions: Fall Restrictions Weight Bearing Restrictions: No    Mobility  Bed Mobility Overal bed mobility: Modified Independent Bed Mobility: Supine to Sit     Supine to sit: Modified independent (Device/Increase time)     General bed mobility comments: increased time  Transfers Overall transfer level: Independent Equipment used: None Transfers: Sit to/from [employer]  to Stand: Independent Stand pivot transfers: Independent          Ambulation/Gait Ambulation/Gait assistance: Independent Gait Distance (Feet): 500 Feet Assistive device: None Gait Pattern/deviations: Step-through pattern Gait velocity: functional Gait velocity interpretation: 1.31 - 2.62 ft/sec, indicative of limited community ambulator General Gait Details: steady step through gait pattern, slightly reduced gait speed. Pt scanning often to left side due to chronic vision deficits   Stairs Stairs:  (pt declines the need for stair assessment)           Wheelchair Mobility    Modified Rankin (Stroke Patients Only) Modified Rankin (Stroke Patients Only) Pre-Morbid Rankin Score: No significant disability Modified Rankin: No significant disability     Balance Overall balance assessment: No apparent balance deficits (not formally assessed)                                          Cognition Arousal/Alertness: Awake/alert Behavior During Therapy: WFL for tasks assessed/performed Overall Cognitive Status: Within Functional Limits for tasks assessed                                        Exercises      General Comments General comments (skin integrity, edema, etc.): VSS, pt asymptomatic      Pertinent Vitals/Pain Pain Assessment: No/denies pain    Home Living  Prior Function            PT Goals (current goals can now be found in the care plan section) Acute Rehab PT Goals Patient Stated Goal: to go home Progress towards PT goals: Goals met/education completed, patient discharged from PT    Frequency           PT Plan Discharge plan needs to be updated    Co-evaluation              AM-PAC PT "6 Clicks" Mobility   Outcome Measure  Help needed turning from your back to your side while in a flat bed without using bedrails?: None Help needed moving from lying on your back to  sitting on the side of a flat bed without using bedrails?: None Help needed moving to and from a bed to a chair (including a wheelchair)?: None Help needed standing up from a chair using your arms (e.g., wheelchair or bedside chair)?: None Help needed to walk in hospital room?: None Help needed climbing 3-5 steps with a railing? : None 6 Click Score: 24    End of Session   Activity Tolerance: Patient tolerated treatment well Patient left: in chair;with call bell/phone within reach Nurse Communication: Mobility status       Time: 1146-4314 PT Time Calculation (min) (ACUTE ONLY): 18 min  Charges:  $Gait Training: 8-22 mins                     Zenaida Niece, PT, DPT Acute Rehabilitation Pager: [phone]    Zenaida Niece 02/25/2020, 2:37 PM

## 2020-02-25 NOTE — Plan of Care (Signed)
  Problem: Education: Goal: Knowledge of General Education information will improve Description: Including pain rating scale, medication(s)/side effects and non-pharmacologic comfort measures 02/25/2020 1854 by Radford Pax, RN Outcome: Progressing 02/25/2020 1852 by Radford Pax, RN Outcome: Progressing   Problem: Health Behavior/Discharge Planning: Goal: Ability to manage health-related needs will improve 02/25/2020 1854 by Radford Pax, RN Outcome: Progressing 02/25/2020 1852 by Radford Pax, RN Outcome: Progressing   Problem: Clinical Measurements: Goal: Ability to maintain clinical measurements within normal limits will improve 02/25/2020 1854 by Radford Pax, RN Outcome: Progressing 02/25/2020 1852 by Radford Pax, RN Outcome: Progressing Goal: Will remain free from infection Outcome: Progressing Goal: Diagnostic test results will improve 02/25/2020 1854 by Radford Pax, RN Outcome: Progressing 02/25/2020 1852 by Radford Pax, RN Outcome: Progressing Goal: Respiratory complications will improve 02/25/2020 1854 by Radford Pax, RN Outcome: Progressing 02/25/2020 1852 by Radford Pax, RN Outcome: Progressing Goal: Cardiovascular complication will be avoided 02/25/2020 1854 by Radford Pax, RN Outcome: Progressing 02/25/2020 1852 by Radford Pax, RN Outcome: Progressing   Problem: Activity: Goal: Risk for activity intolerance will decrease 02/25/2020 1854 by Radford Pax, RN Outcome: Progressing 02/25/2020 1852 by Radford Pax, RN Outcome: Progressing   Problem: Nutrition: Goal: Adequate nutrition will be maintained 02/25/2020 1854 by Radford Pax, RN Outcome: Progressing 02/25/2020 1852 by Radford Pax, RN Outcome: Progressing   Problem: Coping: Goal: Level of anxiety will decrease 02/25/2020 1854 by Radford Pax, RN Outcome: Progressing 02/25/2020 1852 by Radford Pax, RN Outcome: Progressing   Problem: Elimination: Goal: Will not  experience complications related to bowel motility 02/25/2020 1854 by Radford Pax, RN Outcome: Progressing 02/25/2020 1852 by Radford Pax, RN Outcome: Progressing Goal: Will not experience complications related to urinary retention 02/25/2020 1854 by Radford Pax, RN Outcome: Progressing 02/25/2020 1852 by Radford Pax, RN Outcome: Progressing   Problem: Pain Managment: Goal: General experience of comfort will improve Outcome: Progressing   Problem: Safety: Goal: Ability to remain free from injury will improve 02/25/2020 1854 by Radford Pax, RN Outcome: Progressing 02/25/2020 1852 by Radford Pax, RN Outcome: Progressing   Problem: Skin Integrity: Goal: Risk for impaired skin integrity will decrease 02/25/2020 1854 by Radford Pax, RN Outcome: Progressing 02/25/2020 1852 by Radford Pax, RN Outcome: Progressing   Problem: Education: Goal: Knowledge of disease or condition will improve Outcome: Progressing Goal: Knowledge of secondary prevention will improve Outcome: Progressing Goal: Knowledge of patient specific risk factors addressed and post discharge goals established will improve Outcome: Progressing   Problem: Coping: Goal: Will verbalize positive feelings about self Outcome: Progressing Goal: Will identify appropriate support needs Outcome: Progressing   Problem: Health Behavior/Discharge Planning: Goal: Ability to manage health-related needs will improve Outcome: Progressing   Problem: Self-Care: Goal: Ability to participate in self-care as condition permits will improve Outcome: Progressing Goal: Verbalization of feelings and concerns over difficulty with self-care will improve Outcome: Progressing Goal: Ability to communicate needs accurately will improve Outcome: Progressing   Problem: Nutrition: Goal: Risk of aspiration will decrease Outcome: Progressing Goal: Dietary intake will improve Outcome: Progressing

## 2020-02-26 LAB — BASIC METABOLIC PANEL
Anion gap: 8 (ref 5–15)
BUN: 15 mg/dL (ref 8–23)
CO2: 27 mmol/L (ref 22–32)
Calcium: 9.7 mg/dL (ref 8.9–10.3)
Chloride: 103 mmol/L (ref 98–111)
Creatinine, Ser: 0.89 mg/dL (ref 0.44–1.00)
GFR calc Af Amer: >60 mL/min (ref >60)
GFR calc non Af Amer: >60 mL/min (ref >60)
Glucose, Bld: 110 mg/dL — ABNORMAL HIGH (ref 70–99)
Potassium: 3.8 mmol/L (ref 3.5–5.1)
Sodium: 138 mmol/L (ref 135–145)

## 2020-02-26 LAB — CBC
HCT: 37.3 % (ref 36.0–46.0)
Hemoglobin: 11.7 g/dL — ABNORMAL LOW (ref 12.0–15.0)
MCH: 27.9 pg (ref 26.0–34.0)
MCHC: 31.4 g/dL (ref 30.0–36.0)
MCV: 88.8 fL (ref 80.0–100.0)
Platelets: 282 10*3/uL (ref 150–400)
RBC: 4.2 MIL/uL (ref 3.87–5.11)
RDW: 14 % (ref 11.5–15.5)
WBC: 7.8 10*3/uL (ref 4.0–10.5)
nRBC: 0 % (ref 0.0–0.2)

## 2020-02-26 MED ORDER — LEVETIRACETAM 750 MG PO TABS: 750.0000 mg | ORAL_TABLET | Freq: Two times a day (BID) | ORAL | 1 refills | Status: AC

## 2020-02-26 MED ORDER — AMLODIPINE BESYLATE 10 MG PO TABS: 10.0000 mg | ORAL_TABLET | Freq: Every day | ORAL | 1 refills | Status: DC

## 2020-02-26 NOTE — Evaluation (Signed)
Occupational Therapy Evaluation Patient Details Name: Stacy Flores MRN: [id number] DOB: 1956-04-14 Today's Date: 02/26/2020    History of Present Illness 64 y.o. 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 She also complained of LUE shaking the night before, followed by confusion, that was concerning for seizure. Family felt that they saw some right-sided facial drooping which was new. CT head at [facility] revealed a large right parietal intraparenchymal hemorrhage with significant edema and effacement of the R lateral ventricle.   Clinical Impression   This 64 y/o female presents with the above. PTA pt reports living alone and completing ADL and mobility tasks independently. Pt currently presenting likely close to her baseline. She is able to perform room level mobility without AD, LB, toileting and standing ADL at supervision level throughout. Pt with residual LUE weakness/decreased fine motor coordination and strength she reports from previous CVA - will likely benefit from follow up for LUE HEP, pt in agreement. Pt to benefit from continued acute OT services to further address above and below listed deficits, do not anticipate pt will require follow up OT services after discharge.     Follow Up Recommendations  No OT follow up;Supervision - Intermittent (rec close to 24hr initially )    Equipment Recommendations  Tub/shower seat           Precautions / Restrictions Precautions Precautions: Fall Restrictions Weight Bearing Restrictions: No      Mobility Bed Mobility Overal bed mobility: Modified Independent Bed Mobility: Supine to Sit     Supine to sit: Modified independent (Device/Increase time)     General bed mobility comments: increased  time  Transfers Overall transfer level: Independent Equipment used: None Transfers: Sit to/from [employer] to Stand: Supervision Stand pivot transfers: Independent       General transfer comment: for lines and safety    Balance Overall balance assessment: Mild deficits observed, not formally tested                                         ADL either performed or assessed with clinical judgement   ADL Overall ADL's : Needs assistance/impaired Eating/Feeding: Modified independent   Grooming: Wash/dry hands;Wash/dry face;Oral care;Supervision/safety;Standing   Upper Body Bathing: Supervision/ safety;Standing;Sitting   Lower Body Bathing: Supervison/ safety;Sit to/from stand   Upper Body Dressing : Set up;Supervision/safety;Sitting   Lower Body Dressing: Supervision/safety;Sit to/from stand Lower Body Dressing Details (indicate cue type and reason): via figure 4 for socks  Toilet Transfer: Supervision/safety;Ambulation;Regular Toilet;Grab bars   Toileting- Clothing Manipulation and Hygiene: Supervision/safety;Modified independent;Sitting/lateral lean;Sit to/from stand Toileting - Clothing Manipulation Details (indicate cue type and reason): performing pericare and gown management without assist      Functional mobility during ADLs: Supervision/safety       Vision Baseline Vision/History: Wears glasses Wears Glasses: At all times Additional Comments: suspect pt with residual L side visual deficits, occasionally with difficulty tracking but pt able to navigate environment and perform functional tasks without difficulty. pt denies vision changes from her baseline      Perception     Praxis      Pertinent Vitals/Pain Pain Assessment: No/denies pain     Hand  Dominance Right   Extremity/Trunk Assessment Upper Extremity Assessment Upper Extremity Assessment: LUE deficits/detail LUE Deficits / Details: residual weakness in LUE from  previous stroke, some grossly weaker than RUE including grip strength and fine motor/coorindation LUE Coordination: decreased fine motor (mild)   Lower Extremity Assessment Lower Extremity Assessment: Defer to PT evaluation   Cervical / Trunk Assessment Cervical / Trunk Assessment: Kyphotic   Communication Communication Communication: No difficulties   Cognition Arousal/Alertness: Awake/alert Behavior During Therapy: WFL for tasks assessed/performed Overall Cognitive Status: Within Functional Limits for tasks assessed                                     General Comments  VSS    Exercises     Shoulder Instructions      Home Living Family/patient expects to be discharged to:: Private residence Living Arrangements: Alone Available Help at Discharge: Family;Available PRN/intermittently Type of Home: House Home Access: Stairs to enter [employer] of Steps: 1 (threshold) Entrance Stairs-Rails: None Home Layout: One level     Bathroom Shower/Tub: IT trainer: Standard     Home Equipment: None          Prior Functioning/Environment Level of Independence: Independent                 OT Problem List: Decreased range of motion;Decreased strength;Decreased coordination;Impaired UE functional use      OT Treatment/Interventions: Self-care/ADL training;Therapeutic exercise;Neuromuscular education;DME and/or AE instruction;Therapeutic activities;Visual/perceptual remediation/compensation;Patient/family education;Balance training    OT Goals(Current goals can be found in the care plan section) Acute Rehab OT Goals Patient Stated Goal: to go home OT Goal Formulation: With patient/family Time For Goal Achievement: 03/11/20 Potential to Achieve Goals: Good  OT Frequency: Min 2X/week   Barriers to D/C:            Co-evaluation              AM-PAC OT "6 Clicks" Daily Activity     Outcome Measure Help  from another person eating meals?: None Help from another person taking care of personal grooming?: A Little Help from another person toileting, which includes using toliet, bedpan, or urinal?: A Little Help from another person bathing (including washing, rinsing, drying)?: A Little Help from another person to put on and taking off regular upper body clothing?: None Help from another person to put on and taking off regular lower body clothing?: A Little 6 Click Score: 20   End of Session Equipment Utilized During Treatment: Gait belt Nurse Communication: Mobility status  Activity Tolerance: Patient tolerated treatment well Patient left: in chair;with call bell/phone within reach;with family/visitor present  OT Visit Diagnosis: Other abnormalities of gait and mobility (R26.89);Muscle weakness (generalized) (M62.81)                Time: 5974-1638 OT Time Calculation (min): 25 min Charges:  OT General Charges $OT Visit: 1 Visit OT Evaluation $OT Eval Moderate Complexity: 1 Mod OT Treatments $Self Care/Home Management : 8-22 mins  Marcy Siren, OT Acute Rehabilitation Services Pager [phone] Office [phone]   Orlando Penner 02/26/2020, 10:12 AM

## 2020-02-26 NOTE — Progress Notes (Signed)
Pt discharged home. PIV was removed, belongings packed, and all questions regarding discharge information were answered. Prior to discharge, pt stated that she had run out of refills for her Keppra. Delton See, PA, made aware of this and agreed to call her pharmacy so that she would have the medication available. Pt escorted out in wheelchair to private vehicle.   Robina Ade, RN

## 2020-03-01 ENCOUNTER — Emergency Department (HOSPITAL_COMMUNITY): Payer: Self-pay

## 2020-03-01 ENCOUNTER — Encounter (HOSPITAL_COMMUNITY): Payer: Self-pay

## 2020-03-01 ENCOUNTER — Inpatient Hospital Stay (HOSPITAL_COMMUNITY)
Admission: EM | Admit: 2020-03-01 | Discharge: 2020-03-03 | DRG: 064 | Disposition: A | Discharge status: Home Health | Payer: Self-pay | Attending: Internal Medicine | Admitting: Internal Medicine

## 2020-03-01 ENCOUNTER — Other Ambulatory Visit: Payer: Self-pay

## 2020-03-01 DIAGNOSIS — I951 Orthostatic hypotension: Secondary | ICD-10-CM | POA: Diagnosis present

## 2020-03-01 DIAGNOSIS — E785 Hyperlipidemia, unspecified: Secondary | ICD-10-CM

## 2020-03-01 DIAGNOSIS — Z87891 Personal history of nicotine dependence: Secondary | ICD-10-CM

## 2020-03-01 DIAGNOSIS — I639 Cerebral infarction, unspecified: Secondary | ICD-10-CM | POA: Diagnosis present

## 2020-03-01 DIAGNOSIS — I611 Nontraumatic intracerebral hemorrhage in hemisphere, cortical: Secondary | ICD-10-CM | POA: Diagnosis present

## 2020-03-01 DIAGNOSIS — G936 Cerebral edema: Secondary | ICD-10-CM | POA: Diagnosis present

## 2020-03-01 DIAGNOSIS — I680 Cerebral amyloid angiopathy: Secondary | ICD-10-CM | POA: Diagnosis present

## 2020-03-01 DIAGNOSIS — R42 Dizziness and giddiness: Secondary | ICD-10-CM | POA: Diagnosis present

## 2020-03-01 DIAGNOSIS — E854 Organ-limited amyloidosis: Secondary | ICD-10-CM | POA: Diagnosis present

## 2020-03-01 DIAGNOSIS — Z823 Family history of stroke: Secondary | ICD-10-CM

## 2020-03-01 DIAGNOSIS — R2681 Unsteadiness on feet: Secondary | ICD-10-CM | POA: Diagnosis present

## 2020-03-01 DIAGNOSIS — G40909 Epilepsy, unspecified, not intractable, without status epilepticus: Secondary | ICD-10-CM

## 2020-03-01 DIAGNOSIS — Z20822 Contact with and (suspected) exposure to covid-19: Secondary | ICD-10-CM | POA: Diagnosis present

## 2020-03-01 DIAGNOSIS — I10 Essential (primary) hypertension: Secondary | ICD-10-CM | POA: Diagnosis present

## 2020-03-01 DIAGNOSIS — I6340 Cerebral infarction due to embolism of unspecified cerebral artery: Principal | ICD-10-CM | POA: Diagnosis present

## 2020-03-01 LAB — BASIC METABOLIC PANEL
Anion gap: 12 (ref 5–15)
BUN: 8 mg/dL (ref 8–23)
CO2: 24 mmol/L (ref 22–32)
Calcium: 9.7 mg/dL (ref 8.9–10.3)
Chloride: 97 mmol/L — ABNORMAL LOW (ref 98–111)
Creatinine, Ser: 0.56 mg/dL (ref 0.44–1.00)
GFR calc Af Amer: >60 mL/min (ref >60)
GFR calc non Af Amer: >60 mL/min (ref >60)
Glucose, Bld: 152 mg/dL — ABNORMAL HIGH (ref 70–99)
Potassium: 3.8 mmol/L (ref 3.5–5.1)
Sodium: 133 mmol/L — ABNORMAL LOW (ref 135–145)

## 2020-03-01 LAB — CBC WITH DIFFERENTIAL/PLATELET
Abs Immature Granulocytes: 0.04 10*3/uL (ref 0.00–0.07)
Basophils Absolute: 0 10*3/uL (ref 0.0–0.1)
Basophils Relative: 0 %
Eosinophils Absolute: 0 10*3/uL (ref 0.0–0.5)
Eosinophils Relative: 0 %
HCT: 41 % (ref 36.0–46.0)
Hemoglobin: 13.2 g/dL (ref 12.0–15.0)
Immature Granulocytes: 0 %
Lymphocytes Relative: 10 %
Lymphs Abs: 0.9 10*3/uL (ref 0.7–4.0)
MCH: 28.4 pg (ref 26.0–34.0)
MCHC: 32.2 g/dL (ref 30.0–36.0)
MCV: 88.2 fL (ref 80.0–100.0)
Monocytes Absolute: 0.5 10*3/uL (ref 0.1–1.0)
Monocytes Relative: 6 %
Neutro Abs: 7.5 10*3/uL (ref 1.7–7.7)
Neutrophils Relative %: 84 %
Platelets: 337 10*3/uL (ref 150–400)
RBC: 4.65 MIL/uL (ref 3.87–5.11)
RDW: 13.1 % (ref 11.5–15.5)
WBC: 8.9 10*3/uL (ref 4.0–10.5)
nRBC: 0 % (ref 0.0–0.2)

## 2020-03-01 LAB — PROTIME-INR
INR: 1 (ref 0.8–1.2)
Prothrombin Time: 13 seconds (ref 11.4–15.2)

## 2020-03-01 MED ORDER — ONDANSETRON HCL 4 MG/2ML IJ SOLN
4.0000 mg | Freq: Once | INTRAMUSCULAR | Status: AC
  Administered 2020-03-01: 4 mg via INTRAVENOUS
  Filled 2020-03-01: qty 2

## 2020-03-01 MED ORDER — SODIUM CHLORIDE 0.9 % IV SOLN
1000.0000 mL | INTRAVENOUS | Status: DC
  Administered 2020-03-01: 1000 mL via INTRAVENOUS

## 2020-03-01 MED ORDER — GADOBUTROL 1 MMOL/ML IV SOLN
7.0000 mL | Freq: Once | INTRAVENOUS | Status: AC | PRN
  Administered 2020-03-01: 7 mL via INTRAVENOUS

## 2020-03-01 MED ORDER — SODIUM CHLORIDE 0.9 % IV BOLUS (SEPSIS)
1000.0000 mL | Freq: Once | INTRAVENOUS | Status: AC
  Administered 2020-03-01: 1000 mL via INTRAVENOUS

## 2020-03-01 MED ORDER — LORAZEPAM 2 MG/ML IJ SOLN
1.0000 mg | Freq: Once | INTRAMUSCULAR | Status: AC
  Administered 2020-03-01: 1 mg via INTRAVENOUS
  Filled 2020-03-01: qty 1

## 2020-03-01 NOTE — ED Notes (Signed)
Patient transported to MRI 

## 2020-03-01 NOTE — ED Provider Notes (Addendum)
MSE was initiated and I personally evaluated the patient and placed orders (if any) at  5:13 PM on March 01, 2020.  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 Pt was recently seen in this ED and admitted for an ICH. She was discharged on 02/26/20. She presents today with similar sx. Her daughter states that she was feeling fine and this morning was complaining of HA, vomiting, dizziness and fatigue which are similar to her complaints at her prior admission.  Patient appears hunched over in her chair. She will follow commands but appears extremely fatigued. She speaks slowly and softly.  Physical Exam Vitals and nursing note reviewed.  Constitutional:      General: She is in acute distress.     Appearance: She is well-developed and normal weight. She is not ill-appearing, toxic-appearing or diaphoretic.  HENT:     Head: Normocephalic and atraumatic.     Mouth/Throat:     Pharynx: Oropharynx is clear.  Eyes:     General: No scleral icterus.    Pupils: Pupils are equal, round, and reactive to light. Pupils are equal.     Right eye: Pupil is reactive.     Left eye: Pupil is reactive.  Cardiovascular:     Rate and Rhythm: Normal rate.     Heart sounds: No murmur heard.  No friction rub. No gallop.   Pulmonary:     Effort: Pulmonary effort is normal. No respiratory distress.     Breath sounds: Normal breath sounds. No stridor. No wheezing, rhonchi or rales.     Comments: Poor inspiratory effort. Abdominal:     Palpations: Abdomen is soft.  Skin:    General: Skin is warm and dry.  Neurological:     Mental Status: She is oriented to person, place, and time. She is lethargic.     Comments: Patient is A&O x3. No arm drift noted. Patient follows commands. Patient appears fatigued and weak but is able to move all 4 extremities when asked.  Psychiatric:        Speech: Speech is delayed.        Behavior: Behavior is slowed.    Plan: I have  ordered a CT scan of the patient's head. She has been transferred to a a bed in the ED and patient care will be continued by an EDP.  Note: Portions of this report may have been transcribed using voice recognition software. Every effort was made to ensure accuracy; however, inadvertent computerized transcription errors may be present.     Placido Sou, PA-C 03/01/20 1822    Placido Sou, PA-C 03/01/20 2038    Eber Hong, MD 03/01/20 [phone]

## 2020-03-01 NOTE — ED Triage Notes (Signed)
Pt presents to ED with complaints of severe headache, vomiting, dizziness that began in the middle of the night. PT was recently admitted for brain bleed and discharged Sunday 7/18. Pt was completely back to baseline and independent yesterday. PERRL

## 2020-03-01 NOTE — ED Provider Notes (Signed)
[facility] EMERGENCY DEPARTMENT Provider Note   CSN: [id number] Arrival date & time: 03/01/20  1630     History Chief Complaint  Patient presents with  . Dizziness  . Headache    Stacy Flores is a 64 y.o. female.  HPI   Patient presented to the ED for evaluation of dizziness.  Patient was recently in the hospital for a cerebral hemorrhage.  She was admitted on July 15 and discharged on July 18.  Patient was feeling much better.  However last night she started having episodes of nausea vomiting and dizziness.  Patient vomited several times throughout the night.  She has been feeling dizzy and it gets worse when she is moving around and are keeping her eyes open.  She denies any headache now but did have one this morning..  No numbness or weakness.  Past Medical History:  Diagnosis Date  . Hypertension   . Stroke [facility])     Patient Active Problem List   Diagnosis Date Noted  . Muscle twitching 01/14/2020  . Weakness of left upper extremity 01/13/2020  . Cerebral amyloid angiopathy (CODE) 07/25/2019  . Essential hypertension 07/25/2019  . Hyperlipemia 07/25/2019  . 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 ICH (intracerebral hemorrhage) (HCC) 07/21/2019    Past Surgical History:  Procedure Laterality Date  . ABDOMINAL HYSTERECTOMY    . TONSILLECTOMY       OB History   No obstetric history on file.     No family history on file.  Social History   Tobacco Use  . Smoking status: Former Games developer  . Smokeless tobacco: Never Used  Vaping Use  . Vaping Use: Never used  Substance Use Topics  . Alcohol use: Not Currently  . Drug use: Never    Home Medications Prior to Admission medications   Medication Sig Start Date End Date Taking? Authorizing Provider  acetaminophen (TYLENOL) 500 MG tablet Take 500 mg by mouth every 6 (six) hours as needed for headache.   Yes [provider]  amLODipine (NORVASC) 10 MG  tablet Take 1 tablet (10 mg total) by mouth daily. 02/27/20  Yes Rinehuls, Kinnie Scales, PA-C  diphenhydrAMINE (BENADRYL) 25 mg capsule Take 25 mg by mouth every 6 (six) hours as needed.   Yes [provider]  levETIRAcetam (KEPPRA) 750 MG tablet Take 1 tablet (750 mg total) by mouth 2 (two) times daily. 02/26/20  Yes Rinehuls, Kinnie Scales, PA-C    Allergies    Patient has no known allergies.  Review of Systems   Review of Systems  All other systems reviewed and are negative.   Physical Exam Updated Vital Signs BP (!) 167/88   Pulse 88   Temp 98.8 F (37.1 C) (Oral)   Resp 17   Ht 1.6 m (5\' 3" )   Wt 72.1 kg   SpO2 100%   BMI 28.17 kg/m   Physical Exam Vitals and nursing note reviewed.  Constitutional:      Appearance: She is well-developed. She is ill-appearing.     Comments: Patient prefers to keep her eyes closed  HENT:     Head: Normocephalic and atraumatic.     Right Ear: External ear normal.     Left Ear: External ear normal.  Eyes:     General: No scleral icterus.       Right eye: No discharge.        Left eye: No discharge.     Conjunctiva/sclera: Conjunctivae  normal.  Neck:     Trachea: No tracheal deviation.  Cardiovascular:     Rate and Rhythm: Normal rate and regular rhythm.  Pulmonary:     Effort: Pulmonary effort is normal. No respiratory distress.     Breath sounds: Normal breath sounds. No stridor. No wheezing or rales.  Abdominal:     General: Bowel sounds are normal. There is no distension.     Palpations: Abdomen is soft.     Tenderness: There is no abdominal tenderness. There is no guarding or rebound.  Musculoskeletal:        General: No tenderness.     Cervical back: Neck supple.  Skin:    General: Skin is warm and dry.     Findings: No rash.  Neurological:     Mental Status: She is alert and oriented to person, place, and time.     Cranial Nerves: No cranial nerve deficit (No facial droop, , tongue midline  ).     Sensory: No sensory  deficit.     Motor: No abnormal muscle tone or seizure activity.     Comments: No pronator drift bilateral upper extrem, able to hold both legs off bed for 5 seconds, sensation intact in all extremities, no visual field cuts, no left or right sided neglect, normal finger-nose exam bilaterally, no nystagmus noted      ED Results / Procedures / Treatments   Labs (all labs ordered are listed, but only abnormal results are displayed) Labs Reviewed  BASIC METABOLIC PANEL - Abnormal; Notable for the following components:      Result Value   Sodium 133 (*)    Chloride 97 (*)    Glucose, Bld 152 (*)    All other components within normal limits  CBC WITH DIFFERENTIAL/PLATELET  PROTIME-INR    EKG EKG Interpretation  Date/Time:  Thursday March 01 2020 16:50:45 EDT Ventricular Rate:  79 PR Interval:  134 QRS Duration: 82 QT Interval:  392 QTC Calculation: 449 R Axis:   53 Text Interpretation: Normal sinus rhythm Nonspecific ST and T wave abnormality Abnormal ECG No significant change since last tracing Confirmed by Linwood Dibbles [phone]) on 03/01/2020 5:40:58 PM   Radiology CT Head Wo Contrast  Result Date: 03/01/2020 CLINICAL DATA:  Dizziness, headache, intracranial hemorrhage EXAM: CT HEAD WITHOUT CONTRAST TECHNIQUE: Contiguous axial images were obtained from the base of the skull through the vertex without intravenous contrast. COMPARISON:  02/24/2020 FINDINGS: Brain: Right occipital para falcine intraparenchymal hematoma remains stable, measuring 1.9 x 3.3 cm at axial image # 19. Intraventricular clot has resolved in the interval. Tiny focus of intraparenchymal hemorrhage within the right parietal lobe appears stable at axial image # 18. Cytotoxic edema within the right occipital lobe and vasogenic edema within the right deep temporal and parietal lobes appears stable. No interval hemorrhage. No interval change in a mild mass effect with sulcal effacement involving the right cerebral  hemisphere. Ventricular size is normal. Cerebellum is unremarkable. Vascular: No hyperdense vasculature at the skull base. Skull: Intact Sinuses/Orbits: The paranasal sinuses are clear. Ocular alignment appears divergent. Campbell Stall are otherwise unremarkable. Other: Mastoid air cells and middle ear cavities are clear. IMPRESSION: Stable intraparenchymal hematoma within the right parietal and right occipital lobes. Resolved intraventricular clot. Stable mild mass effect and cerebral edema. No interval hemorrhage. Electronically Signed   By: Helyn Numbers MD   On: 03/01/2020 18:12   MR Brain W and Wo Contrast  Result Date: 03/01/2020 CLINICAL DATA:  Headache, vomiting  and dizziness EXAM: MRI HEAD WITHOUT AND WITH CONTRAST TECHNIQUE: Multiplanar, multiecho pulse sequences of the brain and surrounding structures were obtained without and with intravenous contrast. CONTRAST:  28mL GADAVIST GADOBUTROL 1 MMOL/ML IV SOLN COMPARISON:  head CT 03/01/2020 Brain MRI 01/14/2020 FINDINGS: Brain: Intraparenchymal hemorrhage of the right occipital lobe measures 3.6 x 2.3 cm, previously 3.3 x 1.9 cm. There is a punctate focus of acute ischemia in the right cerebellum. There are numerous petechial hemorrhages within the right occipital lobe and posterior temporal lobe, as well as a few in the left occipital lobe. There is moderate right parieto-occipital edema. Normal volume of CSF spaces. There is no abnormal contrast enhancement. Vascular: Normal flow voids. Skull and upper cervical spine: Normal marrow signal. Sinuses/Orbits: Negative. Other: None. IMPRESSION: 1. Slight increase in size of intraparenchymal hemorrhage of the right occipital lobe, now measuring 3.6 x 2.3 cm, previously 3.3 x 1.9 cm. 2. Numerous petechial hemorrhages within the right occipital lobe and posterior temporal lobe, as well as a few in the left occipital lobe. 3. Punctate focus of acute ischemia in the right cerebellum. Electronically Signed   By: Deatra Robinson M.D.   On: 03/01/2020 22:29    Procedures Procedures (including critical care time)  Medications Ordered in ED Medications  sodium chloride 0.9 % bolus 1,000 mL (0 mLs Intravenous Stopped 03/01/20 1916)    Followed by  0.9 %  sodium chloride infusion (1,000 mLs Intravenous New Bag/Given 03/01/20 1916)  ondansetron (ZOFRAN) injection 4 mg (4 mg Intravenous Given 03/01/20 1804)  LORazepam (ATIVAN) injection 1 mg (1 mg Intravenous Given 03/01/20 2127)  gadobutrol (GADAVIST) 1 MMOL/ML injection 7 mL (7 mLs Intravenous Contrast Given 03/01/20 2208)    ED Course  I have reviewed the triage vital signs and the nursing notes.  Pertinent labs & imaging results that were available during my care of the patient were reviewed by me and considered in my medical decision making (see chart for details).  Clinical Course as of Mar 02 2339  Thu Mar 01, 2020  1751 CBC and electrolyte panel normal.   [JK]  1857 Stable changes on head ct.   [JK]  1937 Discussed case with Dr Amada Jupiter.   [JK]    Clinical Course User Index [JK] Linwood Dibbles, MD   MDM Rules/Calculators/A&P                          Patient presented to the ED for evaluation of acute dizziness.  Patient was recently in the hospital for cerebral hemorrhage.  She had been feeling well until last night when she became dizzy and started having nausea and vomiting.  Patient initially started feel better after IV hydration antiemetics.  Case was discussed with Dr. Amada Jupiter.  Plan was for MRI.  MRI unfortunately does show new area of punctate ischemia of the cerebellum plan admission to the hospital for further treatment. Final Clinical Impression(s) / ED Diagnoses Final diagnoses:  Cerebrovascular accident (CVA), unspecified mechanism (HCC)  Dizziness     Linwood Dibbles, MD 03/01/20 2341

## 2020-03-01 NOTE — H&P (Signed)
History and Physical    Stacy Flores XLK:[id number] DOB: [birth year] DOA: 03/01/2020  PCP: Brayton Elrosinis, Donna J, PA-C  Patient coming from: Home  I have personally briefly reviewed patient's old medical records in [facility]  Chief Complaint: Headache, dizziness, vomiting  HPI: Stacy Flores is a 64 y.o. female with medical history significant for cerebral amyloid angiopathy with history of right frontoparietal ICH in December 2020 (residual visual deficits) and recurrent ICH requiring hospitalization 02/23/2020-02/26/2020, hypertension, hyperlipidemia, and seizure disorder who presents to the ED for evaluation of dizziness, nausea/vomiting, and headache.  History is somewhat limited from patient and is otherwise supplemented by daughter at bedside, EDP, and chart review.  Patient was recently hospitalized in the neuro ICU from 02/23/2020-02/26/2020 with a right frontoparietal parafalcine ICH felt likely due to known cerebral amyloid angiopathy.  At time of that admission she was noted to have confusion and slow speech in addition to headache.  She had work-up with unremarkable CTA head and neck, echocardiogram without cardiac source of emboli identified, and CT venogram of the head without evidence of intracranial venous thrombosis.  EEG 02/24/2020 showed cortical dysfunction in right hemisphere felt secondary to underlying hemorrhage as well as mild diffuse encephalopathy without seizures or definite epileptiform discharges seen.  Patient was discharged to home and per report was completely back to her usual baseline and independent prior to returning to the ED this time.  Patient states that night prior to admission she began to have significant dizziness associated with nausea and vomiting.  She has been having recurrent headache as well.  Family notes she has had increased fatigue.  ED Course:  Initial vitals showed BP 151/85, pulse 83, RR 18, temp 98.8 Fahrenheit, SPO2 98% on room  air.  Labs notable for WBC 8.9, hemoglobin 13.2, platelets 337,000, sodium 133, potassium 3.8, bicarb 24, BUN 8, creatinine 0.56, serum glucose 152, INR 1.0.  CT head without contrast showed stable intraparenchymal hematoma within the right parietal parietal subdural lobes with resolved intraventricular clot.  Stable mild mass-effect and cerebral edema seen without interval hemorrhage.  MRI brain with and without contrast showed slight increase in size of intraparenchymal hemorrhage of the occipital lobe measuring 3.6 x 2.3 cm, previously 3.3 x 1.9 cm.  Numerous petechial hemorrhages within the right septal lobe and posterior temporal lobe, left occipital lobe noted.  A punctate focus of acute ischemia in the right cerebellum also seen.  Patient was given 1 L normal saline, IV Ativan, and Zofran.  EDP discussed with on-call neurology who recommended medical admission and will see in consultation.  The hospitalist service was consulted for further evaluation and management.  Review of Systems: All systems reviewed and are negative except as documented in history of present illness above.   Past Medical History:  Diagnosis Date  . Hypertension   . Stroke [facility](HCC)     Past Surgical History:  Procedure Laterality Date  . ABDOMINAL HYSTERECTOMY    . TONSILLECTOMY      Social History:  reports that she has quit smoking. She has never used smokeless tobacco. She reports previous alcohol use. She reports that she does not use drugs.  No Known Allergies  No family history on file.   Prior to Admission medications   Medication Sig Start Date End Date Taking? Authorizing Provider  acetaminophen (TYLENOL) 500 MG tablet Take 500 mg by mouth every 6 (six) hours as needed for headache.   Yes [provider]  amLODipine (NORVASC) 10 MG tablet  Take 1 tablet (10 mg total) by mouth daily. 02/27/20  Yes Rinehuls, Kinnie Scales, PA-C  diphenhydrAMINE (BENADRYL) 25 mg capsule Take 25 mg by mouth every  6 (six) hours as needed.   Yes [provider]  levETIRAcetam (KEPPRA) 750 MG tablet Take 1 tablet (750 mg total) by mouth 2 (two) times daily. 02/26/20  Yes Myrle Sheng    Physical Exam: Vitals:   03/01/20 2045 03/01/20 2100 03/01/20 2115 03/01/20 2253  BP: (!) 172/96 (!) 159/79 (!) 174/92 (!) 167/88  Pulse: 92 88 89 88  Resp: 19 (!) 25 23 17   Temp:      TempSrc:      SpO2: 100% 100% 100% 100%  Weight:      Height:       Somewhat limited due to cooperation Constitutional: Somnolent but easily awakens, NAD, calm, comfortable Eyes: PERRL, EOMI, lids and conjunctivae normal ENMT: Mucous membranes are moist. Posterior pharynx clear of any exudate or lesions.Normal dentition.  Neck: normal, supple, no masses. Respiratory: clear to auscultation bilaterally, no wheezing, no crackles. Normal respiratory effort. No accessory muscle use.  Cardiovascular: Regular rate and rhythm, no murmurs / rubs / gallops. No extremity edema. 2+ pedal pulses. Abdomen: no tenderness, no masses palpated. No hepatosplenomegaly. Bowel sounds positive.  Musculoskeletal: no clubbing / cyanosis. No joint deformity upper and lower extremities. Good ROM, no contractures. Normal muscle tone.  Skin: no rashes, lesions, ulcers. No induration Neurologic: Speech slow otherwise CN 2-12 grossly intact. Sensation intact, Strength 5/5 in all 4.  Psychiatric: Somnolent but awakens to light stimuli.  Is oriented x3 when awake but appears to have some short-term memory deficits.  She does recognize her daughter at bedside.  Labs on Admission: I have personally reviewed following labs and imaging studies  CBC: Recent Labs  Lab 02/24/20 0805 02/25/20 1157 02/26/20 0406 03/01/20 1813  WBC 7.0 6.7 7.8 8.9  NEUTROABS  --   --   --  7.5  HGB 13.1 13.2 11.7* 13.2  HCT 40.9 41.6 37.3 41.0  MCV 89.1 88.9 88.8 88.2  PLT 261 293 282 337   Basic Metabolic Panel: Recent Labs  Lab 02/24/20 0805  02/25/20 1157 02/26/20 0406 03/01/20 1813  NA 140 138 138 133*  K 3.8 4.4 3.8 3.8  CL 105 103 103 97*  CO2 25 25 27 24   GLUCOSE 111* 107* 110* 152*  BUN 8 6* 15 8  CREATININE 0.70 0.76 0.89 0.56  CALCIUM 9.7 9.8 9.7 9.7   GFR: Estimated Creatinine Clearance: 70.3 mL/min (by C-G formula based on SCr of 0.56 mg/dL). Liver Function Tests: No results for input(s): AST, ALT, ALKPHOS, BILITOT, PROT, ALBUMIN in the last 168 hours. No results for input(s): LIPASE, AMYLASE in the last 168 hours. No results for input(s): AMMONIA in the last 168 hours. Coagulation Profile: Recent Labs  Lab 03/01/20 1813  INR 1.0   Cardiac Enzymes: No results for input(s): CKTOTAL, CKMB, CKMBINDEX, TROPONINI in the last 168 hours. BNP (last 3 results) No results for input(s): PROBNP in the last 8760 hours. HbA1C: No results for input(s): HGBA1C in the last 72 hours. CBG: No results for input(s): GLUCAP in the last 168 hours. Lipid Profile: No results for input(s): CHOL, HDL, LDLCALC, TRIG, CHOLHDL, LDLDIRECT in the last 72 hours. Thyroid Function Tests: No results for input(s): TSH, T4TOTAL, FREET4, T3FREE, THYROIDAB in the last 72 hours. Anemia Panel: No results for input(s): VITAMINB12, FOLATE, FERRITIN, TIBC, IRON, RETICCTPCT in the last 72 hours.  Urine analysis:    Component Value Date/Time   COLORURINE YELLOW 01/13/2020 1850   APPEARANCEUR CLEAR 01/13/2020 1850   LABSPEC <1.005 (L) 01/13/2020 1850   PHURINE 6.0 01/13/2020 1850   GLUCOSEU NEGATIVE 01/13/2020 1850   HGBUR NEGATIVE 01/13/2020 1850   BILIRUBINUR NEGATIVE 01/13/2020 1850   KETONESUR NEGATIVE 01/13/2020 1850   PROTEINUR NEGATIVE 01/13/2020 1850   NITRITE NEGATIVE 01/13/2020 1850   LEUKOCYTESUR NEGATIVE 01/13/2020 1850    Radiological Exams on Admission: CT Head Wo Contrast  Result Date: 03/01/2020 CLINICAL DATA:  Dizziness, headache, intracranial hemorrhage EXAM: CT HEAD WITHOUT CONTRAST TECHNIQUE: Contiguous axial images  were obtained from the base of the skull through the vertex without intravenous contrast. COMPARISON:  02/24/2020 FINDINGS: Brain: Right occipital para falcine intraparenchymal hematoma remains stable, measuring 1.9 x 3.3 cm at axial image # 19. Intraventricular clot has resolved in the interval. Tiny focus of intraparenchymal hemorrhage within the right parietal lobe appears stable at axial image # 18. Cytotoxic edema within the right occipital lobe and vasogenic edema within the right deep temporal and parietal lobes appears stable. No interval hemorrhage. No interval change in a mild mass effect with sulcal effacement involving the right cerebral hemisphere. Ventricular size is normal. Cerebellum is unremarkable. Vascular: No hyperdense vasculature at the skull base. Skull: Intact Sinuses/Orbits: The paranasal sinuses are clear. Ocular alignment appears divergent. Campbell Stall are otherwise unremarkable. Other: Mastoid air cells and middle ear cavities are clear. IMPRESSION: Stable intraparenchymal hematoma within the right parietal and right occipital lobes. Resolved intraventricular clot. Stable mild mass effect and cerebral edema. No interval hemorrhage. Electronically Signed   By: Helyn Numbers MD   On: 03/01/2020 18:12   MR Brain W and Wo Contrast  Result Date: 03/01/2020 CLINICAL DATA:  Headache, vomiting and dizziness EXAM: MRI HEAD WITHOUT AND WITH CONTRAST TECHNIQUE: Multiplanar, multiecho pulse sequences of the brain and surrounding structures were obtained without and with intravenous contrast. CONTRAST:  46mL GADAVIST GADOBUTROL 1 MMOL/ML IV SOLN COMPARISON:  head CT 03/01/2020 Brain MRI 01/14/2020 FINDINGS: Brain: Intraparenchymal hemorrhage of the right occipital lobe measures 3.6 x 2.3 cm, previously 3.3 x 1.9 cm. There is a punctate focus of acute ischemia in the right cerebellum. There are numerous petechial hemorrhages within the right occipital lobe and posterior temporal lobe, as well as a few  in the left occipital lobe. There is moderate right parieto-occipital edema. Normal volume of CSF spaces. There is no abnormal contrast enhancement. Vascular: Normal flow voids. Skull and upper cervical spine: Normal marrow signal. Sinuses/Orbits: Negative. Other: None. IMPRESSION: 1. Slight increase in size of intraparenchymal hemorrhage of the right occipital lobe, now measuring 3.6 x 2.3 cm, previously 3.3 x 1.9 cm. 2. Numerous petechial hemorrhages within the right occipital lobe and posterior temporal lobe, as well as a few in the left occipital lobe. 3. Punctate focus of acute ischemia in the right cerebellum. Electronically Signed   By: Deatra Robinson M.D.   On: 03/01/2020 22:29    EKG: Independently reviewed. Normal sinus rhythm without acute ischemic changes.  Not significantly changed when compared to prior.  Assessment/Plan Principal Problem:   Acute cerebrovascular accident (CVA) (HCC) Active Problems:   Cerebral amyloid angiopathy (CODE)   Essential hypertension   Hyperlipidemia   Seizure disorder (HCC)  Stacy Flores is a 64 y.o. female with medical history significant for cerebral amyloid angiopathy with history of right frontoparietal ICH in December 2020 (residual visual deficits) and recurrent ICH requiring hospitalization 02/23/2020-02/26/2020, hypertension, hyperlipidemia, and seizure  disorder who is admitted with acute infarct of the right cerebellum.  Acute infarct of right cerebellum: Seen on MRI brain without contrast.  Recently admitted for intracranial hemorrhage. -Hold antiplatelets for now in setting of recent ICH -PT/OT/SLP eval -Has had recent thorough work-up in the last week with CTA head/neck, CT venogram head, echocardiogram -will defer further testing at this time -Will allow for permissive hypertension at this time pending further neurology recommendations -Neurology to see, further recommendations appreciated  Cerebral amyloid angiopathy with recent  recurrent ICH: MRI shows slight interval increase in size of intraparenchymal hemorrhage of the right occipital lobe with numerous petechial hemorrhages within the right subdural and posterior temporal lobes as well as left sup occipital lobe. -Holding antiplatelets as above  Hypertension: Holding home amlodipine for now as above.  Hyperlipidemia: No longer on statin per neurology recommendations on recent admission.  Seizure disorder: Continue Keppra.  DVT prophylaxis: SCDs Code Status: Full code, confirmed with patient Family Communication: Discussed with patient's daughter at bedside Disposition Plan: From home, discharge pending per neurology evaluation and PT/OT recommendations Consults called: Neurology Admission status:  Status is: Inpatient  Remains inpatient appropriate because:Ongoing diagnostic testing needed not appropriate for outpatient work up and Unsafe d/c plan   Dispo: The patient is from: Home              Anticipated d/c is to: From her SNF pending PT/OT eval              Anticipated d/c date is: 2 days pending neurology and PT/OT evaluations.              Patient currently is not medically stable to d/c.   Darreld Mclean MD [facility]  If 7PM-7AM, please contact night-coverage [email or web address]  03/01/2020, 11:42 PM

## 2020-03-02 ENCOUNTER — Other Ambulatory Visit: Payer: Self-pay | Admitting: *Deleted

## 2020-03-02 DIAGNOSIS — I680 Cerebral amyloid angiopathy: Secondary | ICD-10-CM

## 2020-03-02 DIAGNOSIS — E7800 Pure hypercholesterolemia, unspecified: Secondary | ICD-10-CM

## 2020-03-02 DIAGNOSIS — I639 Cerebral infarction, unspecified: Secondary | ICD-10-CM

## 2020-03-02 DIAGNOSIS — I10 Essential (primary) hypertension: Secondary | ICD-10-CM

## 2020-03-02 LAB — SARS CORONAVIRUS 2 BY RT PCR (HOSPITAL ORDER, PERFORMED IN ~~LOC~~ HOSPITAL LAB): SARS Coronavirus 2: NEGATIVE

## 2020-03-02 MED ORDER — ASPIRIN EC 81 MG PO TBEC: 81.0000 mg | DELAYED_RELEASE_TABLET | Freq: Every day | ORAL | Status: DC

## 2020-03-02 MED ORDER — STROKE: EARLY STAGES OF RECOVERY BOOK
Freq: Once | Status: AC
  Filled 2020-03-02: qty 1

## 2020-03-02 MED ORDER — AMLODIPINE BESYLATE 10 MG PO TABS
10.0000 mg | ORAL_TABLET | Freq: Every day | ORAL | Status: DC
  Filled 2020-03-02: qty 1

## 2020-03-02 MED ORDER — ACETAMINOPHEN 160 MG/5ML PO SOLN: 650.0000 mg | ORAL | Status: DC | PRN

## 2020-03-02 MED ORDER — ASPIRIN EC 81 MG PO TBEC
81.0000 mg | DELAYED_RELEASE_TABLET | Freq: Every day | ORAL | Status: DC
  Administered 2020-03-02 – 2020-03-03 (×2): 81 mg via ORAL
  Filled 2020-03-02 (×3): qty 1

## 2020-03-02 MED ORDER — ASPIRIN 300 MG RE SUPP: 150.0000 mg | Freq: Every day | RECTAL | Status: DC

## 2020-03-02 MED ORDER — SENNOSIDES-DOCUSATE SODIUM 8.6-50 MG PO TABS: 1.0000 | ORAL_TABLET | Freq: Every evening | ORAL | Status: DC | PRN

## 2020-03-02 MED ORDER — LEVETIRACETAM 750 MG PO TABS
750.0000 mg | ORAL_TABLET | Freq: Two times a day (BID) | ORAL | Status: DC
  Administered 2020-03-02 – 2020-03-03 (×3): 750 mg via ORAL
  Filled 2020-03-02 (×4): qty 1

## 2020-03-02 MED ORDER — ONDANSETRON HCL 4 MG/2ML IJ SOLN: 4.0000 mg | Freq: Three times a day (TID) | INTRAMUSCULAR | Status: DC | PRN

## 2020-03-02 MED ORDER — ACETAMINOPHEN 325 MG PO TABS
650.0000 mg | ORAL_TABLET | ORAL | Status: DC | PRN
  Administered 2020-03-02 – 2020-03-03 (×2): 650 mg via ORAL
  Filled 2020-03-02 (×2): qty 2

## 2020-03-02 MED ORDER — ACETAMINOPHEN 650 MG RE SUPP: 650.0000 mg | RECTAL | Status: DC | PRN

## 2020-03-02 NOTE — Progress Notes (Signed)
STROKE TEAM PROGRESS NOTE   INTERVAL HISTORY Her daughter is at the bedside.  I have personally reviewed history of presenting illness with the patient, her daughter and electronic medical records as well as imaging films in PACS.  She was discharged last week with intracerebral hemorrhage thought to be related to amyloid angiopathy.  She presents with sudden onset of dizziness which is small right cerebellar infarct noted on the MRI scan.  Vitals:   03/02/20 0300 03/02/20 0400 03/02/20 0528 03/02/20 0600  BP: (!) 155/94 (!) 154/76 (!) 152/79 (!) 169/86  Pulse: 85 80 76 80  Resp: 16 14 14 15   Temp:      TempSrc:      SpO2: 100% 99% 99% 99%  Weight:      Height:       CBC:  Recent Labs  Lab 02/26/20 0406 03/01/20 1813  WBC 7.8 8.9  NEUTROABS  --  7.5  HGB 11.7* 13.2  HCT 37.3 41.0  MCV 88.8 88.2  PLT 282 337   Basic Metabolic Panel:  Recent Labs  Lab 02/26/20 0406 03/01/20 1813  NA 138 133*  K 3.8 3.8  CL 103 97*  CO2 27 24  GLUCOSE 110* 152*  BUN 15 8  CREATININE 0.89 0.56  CALCIUM 9.7 9.7    IMAGING past 24 hours CT Head Wo Contrast  Result Date: 03/01/2020 CLINICAL DATA:  Dizziness, headache, intracranial hemorrhage EXAM: CT HEAD WITHOUT CONTRAST TECHNIQUE: Contiguous axial images were obtained from the base of the skull through the vertex without intravenous contrast. COMPARISON:  02/24/2020 FINDINGS: Brain: Right occipital para falcine intraparenchymal hematoma remains stable, measuring 1.9 x 3.3 cm at axial image # 19. Intraventricular clot has resolved in the interval. Tiny focus of intraparenchymal hemorrhage within the right parietal lobe appears stable at axial image # 18. Cytotoxic edema within the right occipital lobe and vasogenic edema within the right deep temporal and parietal lobes appears stable. No interval hemorrhage. No interval change in a mild mass effect with sulcal effacement involving the right cerebral hemisphere. Ventricular size is normal.  Cerebellum is unremarkable. Vascular: No hyperdense vasculature at the skull base. Skull: Intact Sinuses/Orbits: The paranasal sinuses are clear. Ocular alignment appears divergent. 02/26/2020 are otherwise unremarkable. Other: Mastoid air cells and middle ear cavities are clear. IMPRESSION: Stable intraparenchymal hematoma within the right parietal and right occipital lobes. Resolved intraventricular clot. Stable mild mass effect and cerebral edema. No interval hemorrhage. Electronically Signed   By: Campbell Stall MD   On: 03/01/2020 18:12   MR Brain W and Wo Contrast  Result Date: 03/01/2020 CLINICAL DATA:  Headache, vomiting and dizziness EXAM: MRI HEAD WITHOUT AND WITH CONTRAST TECHNIQUE: Multiplanar, multiecho pulse sequences of the brain and surrounding structures were obtained without and with intravenous contrast. CONTRAST:  8mL GADAVIST GADOBUTROL 1 MMOL/ML IV SOLN COMPARISON:  head CT 03/01/2020 Brain MRI 01/14/2020 FINDINGS: Brain: Intraparenchymal hemorrhage of the right occipital lobe measures 3.6 x 2.3 cm, previously 3.3 x 1.9 cm. There is a punctate focus of acute ischemia in the right cerebellum. There are numerous petechial hemorrhages within the right occipital lobe and posterior temporal lobe, as well as a few in the left occipital lobe. There is moderate right parieto-occipital edema. Normal volume of CSF spaces. There is no abnormal contrast enhancement. Vascular: Normal flow voids. Skull and upper cervical spine: Normal marrow signal. Sinuses/Orbits: Negative. Other: None. IMPRESSION: 1. Slight increase in size of intraparenchymal hemorrhage of the right occipital lobe, now measuring 3.6  x 2.3 cm, previously 3.3 x 1.9 cm. 2. Numerous petechial hemorrhages within the right occipital lobe and posterior temporal lobe, as well as a few in the left occipital lobe. 3. Punctate focus of acute ischemia in the right cerebellum. Electronically Signed   By: Deatra Robinson M.D.   On: 03/01/2020 22:29     PHYSICAL EXAM Pleasant middle-aged African-American lady not in distress. . Afebrile. Head is nontraumatic. Neck is supple without bruit.    Cardiac exam no murmur or gallop. Lungs are clear to auscultation. Distal pulses are well felt. Neurological Exam ;  Awake  Alert oriented x 3.  Diminished attention registration and recall.  Normal speech and language.eye movements full without nystagmus but slight left eye exotropia..fundi were not visualized. Vision acuity and fields appear normal. Hearing is normal. Palatal movements are normal. Face symmetric. Tongue midline. Normal strength, tone, reflexes and coordination. Normal sensation. Gait deferred.  ASSESSMENT/PLAN [name] is a 64 y.o. female with history of hypertension and recent intracranial hemorrhage presenting with dizziness since 7/21 pm, w/ nausea, vomiting and unsteady gait.   Stroke:   R cerebellar infarct embolic secondary to unknown source Cerebral Amyloid Angiopathy w/ recurrent ICH  CT head stable R parietal and occipital hemorrhage, resolved intraventricular clot. No new hemorrhage.   MRI  R occipital ICH, slightly larger. numberous petechial hemorrhage R occipital, post temporal lobe, and L occipital lobe. Punctate R cerebellar infarct   LDL 122  HgbA1c 5.7  VTE prophylaxis - SCDs ordered  No antithrombotic prior to admission given recent hemorrhage, now on No antithrombotic. Given symptomatic ischemic infarct, will add low dose aspirin. Discussed w/ daughter  Therapy recommendations:  pending   Disposition:  pending   No additional stroke workup indicated  Hypertension  Home meds:  norvasc 10  BP 150-170s . SBP goal < 180   Hyperlipidemia  Home meds:  None  LDL 122, goal < 70  Recommend no statin due to CAA with recurrent ICH  Dysphagia . Secondary to cerebellar stroke and nausea . NPO . Speech on board   Other Stroke Risk Factors  Quit Cigarette smoking, advised to continue  to stop smoking  Hx ETOH use  Overweight, Body mass index is 28.17 kg/m., recommend weight loss, diet and exercise as appropriate   Hx stroke/TIA  02/23/2020 - Right frontoparietal para-falcine ICH, likely due to known CAAwith recurrence adjacent to a previous hemorrhage    07/2019 - right frontoparietal WM ICH - adjacent to current hematoma - MRA neg for AVM or aneurysm but consistent with CAA. CUS neg. EF 60-65%. LDL 93 and A1C 5.4. UDS neg. Recommend no antithrombotics and keep BP <130.   confirmed no Family hx stroke   Other Active Problems  Seizure d/o on [facility] day # 1 Patient presented with sudden onset of dizziness due to small cerebral infarct likely from small vessel disease.  She has history of intracerebral hemorrhage likely from amyloid angiopathy and remains at risk for further hemorrhages but given her ischemic stroke she needs to be started on aspirin 81 mg daily for secondary prevention.  And long discussion the patient and daughter regarding risk benefit of antiplatelet agents given her history of intracerebral hemorrhage and answered questions.  Recommend therapy evaluation mobilize out of bed as tolerated.  Discussed with Dr. Sharolyn Douglas.  Greater than 50% time during this 35-minute visit were spent on counseling and coordination of care and discussion about intestinal hemorrhage and ischemic stroke and risk benefit  of aspirin. Delia Heady, MD To contact Stroke Continuity provider, please refer to [email or web address]. After hours, contact General Neurology

## 2020-03-02 NOTE — ED Notes (Signed)
Admitting Dr. Patel at bedside.  

## 2020-03-02 NOTE — Patient Outreach (Signed)
[facility]) Care Management  03/02/2020  Stacy Flores April 21, 1956 [id number]   Subjective: No patient outreach at this time, patient currently in ED since 03/01/2020, with Acute infarct of right cerebellum.    Objective:Per KPN (Knowledge Performance Now, point of care tool) and chart review, patient currently in ED since 03/01/2020, with Acute infarct of right cerebellum.    Patient hospitalized on 02/13/2020 - 02/26/2020 for ICH and on 01/13/2020 - 01/15/2020 for seizure.   Patient has a history of cerebral amyloid angiopathy with history of right frontoparietal ICH in December 2020 (residual visual deficits),  prior ICH in December of 2020 with residual chronic left sided weakness,  hypertension, hyperlipidemia, CVA, Hyperglycemia, and seizure disorder.     Assessment: Received Self Pay EMMI Stroke referral on 03/02/2020.  Red Flag Alert Trigger, Day #3, patient answered yes to the following question: Feeling worse overall?  Stacy Flores EMMI follow up pending patient contact.      Plan: RNCM will follow up within 3 business days to verify admission status and notify appropriate team members if needed.     Stacy Flores, BSN, CCM [facility] Telephonic CM Phone: [phone] Fax: [phone]

## 2020-03-02 NOTE — Progress Notes (Signed)
PROGRESS NOTE  Stacy Flores WUJ:[id number] DOB: [birth year] DOA: 03/01/2020 PCP: Brayton El, PA-C  HPI/Recap of past 24 hours: HPI from Dr Venancio Poisson [name] is a 64 y.o. female with medical history significant for cerebral amyloid angiopathy with history of right frontoparietal ICH in December 2020 (residual visual deficits) and recurrent ICH requiring hospitalization 02/23/2020-02/26/2020, hypertension, hyperlipidemia, and seizure disorder who presents to the ED for evaluation of dizziness, nausea/vomiting, and headache.  History is somewhat limited from patient and is otherwise supplemented by daughter at bedside, EDP, and chart review. During last admission, pt had work-up with unremarkable CTA head and neck, echocardiogram without cardiac source of emboli identified, and CT venogram of the head without evidence of intracranial venous thrombosis.  EEG 02/24/2020 showed cortical dysfunction in right hemisphere felt secondary to underlying hemorrhage as well as mild diffuse encephalopathy without seizures or definite epileptiform discharges seen. In the ED, VSS, labs fairly stable. MRI brain showed slight increase in size of intraparenchymal hemorrhage of the occipital lobe. Numerous petechial hemorrhages within the right septal lobe and posterior temporal lobe, left occipital lobe noted.  A punctate focus of acute ischemia in the right cerebellum also seen.  Neurology consulted.  Patient admitted for further management.    Today, patient noted to be very weak, sleeping, but easily arousable, following commands and oriented.  Still reports some nausea but no vomiting.  Denied any headaches, chest pain, abdominal pain, fever/chills.  Daughter at bedside   Assessment/Plan: Principal Problem:   Acute cerebrovascular accident (CVA) [name]) Active Problems:   Cerebral amyloid angiopathy (CODE)   Essential hypertension   Hyperlipidemia   Seizure disorder (HCC)   Acute infarct of  right cerebellum Seen on MRI brain Hold antiplatelets for now in the setting of ICH Noted recent CTA head/neck, CT venogram head, echo Neurology on board, awaiting further recs from stroke team PT/OT/SLP Neurochecks, telemetry Monitor closely  Possible cerebral amyloid angiopathy with ICH Seen on MRI brain Continue to hold antiplatelets Neurochecks, telemetry  Hypertension Hold home amlodipine for now  Hyperlipidemia No longer on statin as per neurology recommendation  Seizure disorder Continue Keppra       Malnutrition Type:      Malnutrition Characteristics:      Nutrition Interventions:       Estimated body mass index is 28.17 kg/m as calculated from the following:   Height as of this encounter: 5\' 3"  (1.6 m).   Weight as of this encounter: 72.1 kg.     Code Status: Full  Family Communication: Daughter at bedside on 03/02/2020  Disposition Plan: Status is: Inpatient  Remains inpatient appropriate because:Inpatient level of care appropriate due to severity of illness   Dispo: The patient is from: Home              Anticipated d/c is to: Home              Anticipated d/c date is: 2 days              Patient currently is not medically stable to d/c.   Consultants:  Neurology  Procedures:  None  Antimicrobials:  None  DVT prophylaxis: SCDs   Objective: Vitals:   03/02/20 0528 03/02/20 0600 03/02/20 1400 03/02/20 1500  BP: (!) 152/79 (!) 169/86 (!) 138/86 (!) 149/92  Pulse: 76 80 91 81  Resp: 14 15 22 13   Temp:      TempSrc:      SpO2: 99% 99% 98% 100%  Weight:      Height:        Intake/Output Summary (Last 24 hours) at 03/02/2020 1540 Last data filed at 03/02/2020 0836 Gross per 24 hour  Intake 1500 ml  Output 700 ml  Net 800 ml   Filed Weights   03/01/20 1656  Weight: 72.1 kg    Exam:  General: NAD, chronically ill-appearing, sleepy, oriented x3, follows command  Cardiovascular: S1, S2 present  Respiratory:  CTAB  Abdomen: Soft, nontender, nondistended, bowel sounds present  Musculoskeletal: No bilateral pedal edema noted  Skin: Normal  Psychiatry: Normal mood  Neurology: Strength equal in all extremities, sensation intact   Data Reviewed: CBC: Recent Labs  Lab 02/25/20 1157 02/26/20 0406 03/01/20 1813  WBC 6.7 7.8 8.9  NEUTROABS  --   --  7.5  HGB 13.2 11.7* 13.2  HCT 41.6 37.3 41.0  MCV 88.9 88.8 88.2  PLT 293 282 337   Basic Metabolic Panel: Recent Labs  Lab 02/25/20 1157 02/26/20 0406 03/01/20 1813  NA 138 138 133*  K 4.4 3.8 3.8  CL 103 103 97*  CO2 25 27 24   GLUCOSE 107* 110* 152*  BUN 6* 15 8  CREATININE 0.76 0.89 0.56  CALCIUM 9.8 9.7 9.7   GFR: Estimated Creatinine Clearance: 70.3 mL/min (by C-G formula based on SCr of 0.56 mg/dL). Liver Function Tests: No results for input(s): AST, ALT, ALKPHOS, BILITOT, PROT, ALBUMIN in the last 168 hours. No results for input(s): LIPASE, AMYLASE in the last 168 hours. No results for input(s): AMMONIA in the last 168 hours. Coagulation Profile: Recent Labs  Lab 03/01/20 1813  INR 1.0   Cardiac Enzymes: No results for input(s): CKTOTAL, CKMB, CKMBINDEX, TROPONINI in the last 168 hours. BNP (last 3 results) No results for input(s): PROBNP in the last 8760 hours. HbA1C: No results for input(s): HGBA1C in the last 72 hours. CBG: No results for input(s): GLUCAP in the last 168 hours. Lipid Profile: No results for input(s): CHOL, HDL, LDLCALC, TRIG, CHOLHDL, LDLDIRECT in the last 72 hours. Thyroid Function Tests: No results for input(s): TSH, T4TOTAL, FREET4, T3FREE, THYROIDAB in the last 72 hours. Anemia Panel: No results for input(s): VITAMINB12, FOLATE, FERRITIN, TIBC, IRON, RETICCTPCT in the last 72 hours. Urine analysis:    Component Value Date/Time   COLORURINE YELLOW 01/13/2020 1850   APPEARANCEUR CLEAR 01/13/2020 1850   LABSPEC <1.005 (L) 01/13/2020 1850   PHURINE 6.0 01/13/2020 1850   GLUCOSEU  NEGATIVE 01/13/2020 1850   HGBUR NEGATIVE 01/13/2020 1850   BILIRUBINUR NEGATIVE 01/13/2020 1850   KETONESUR NEGATIVE 01/13/2020 1850   PROTEINUR NEGATIVE 01/13/2020 1850   NITRITE NEGATIVE 01/13/2020 1850   LEUKOCYTESUR NEGATIVE 01/13/2020 1850   Sepsis Labs: @LABRCNTIP (procalcitonin:4,lacticidven:4)  ) Recent Results (from the past 240 hour(s))  SARS Coronavirus 2 by RT PCR (hospital order, performed in [facility] hospital lab) Nasopharyngeal Nasopharyngeal Swab     Status: None   Collection Time: 02/23/20  4:30 PM   Specimen: Nasopharyngeal Swab  Result Value Ref Range Status   SARS Coronavirus 2 NEGATIVE NEGATIVE Final    Comment: (NOTE) SARS-CoV-2 target nucleic acids are NOT DETECTED.  The SARS-CoV-2 RNA is generally detectable in upper and lower respiratory specimens during the acute phase of infection. The lowest concentration of SARS-CoV-2 viral copies this assay can detect is 250 copies / mL. A negative result does not preclude SARS-CoV-2 infection and should not be used as the sole basis for treatment or other patient management decisions.  A negative  result may occur with improper specimen collection / handling, submission of specimen other than nasopharyngeal swab, presence of viral mutation(s) within the areas targeted by this assay, and inadequate number of viral copies (<250 copies / mL). A negative result must be combined with clinical observations, patient history, and epidemiological information.  Fact Sheet for Patients:   BoilerBrush.com.cy  Fact Sheet for Healthcare Providers: https://pope.com/  This test is not yet approved or  cleared by the [place] FDA and has been authorized for detection and/or diagnosis of SARS-CoV-2 by FDA under an Emergency Use Authorization (EUA).  This EUA will remain in effect (meaning this test can be used) for the duration of the COVID-19 declaration under Section  564(b)(1) of the Act, 21 U.S.C. section 360bbb-3(b)(1), unless the authorization is terminated or revoked sooner.  Performed at [facility], [address] [place], [place] [address]   MRSA PCR Screening     Status: None   Collection Time: 02/23/20 11:33 PM   Specimen: Nasal Mucosa; Nasopharyngeal  Result Value Ref Range Status   MRSA by PCR NEGATIVE NEGATIVE Final    Comment:        The GeneXpert MRSA Assay (FDA approved for NASAL specimens only), is one component of a comprehensive MRSA colonization surveillance program. It is not intended to diagnose MRSA infection nor to guide or monitor treatment for MRSA infections. Performed at [facility], [address], [place], [place] [address]   SARS Coronavirus 2 by RT PCR (hospital order, performed in [facility] hospital lab) Nasopharyngeal Nasopharyngeal Swab     Status: None   Collection Time: 03/02/20 12:17 AM   Specimen: Nasopharyngeal Swab  Result Value Ref Range Status   SARS Coronavirus 2 NEGATIVE NEGATIVE Final    Comment: (NOTE) SARS-CoV-2 target nucleic acids are NOT DETECTED.  The SARS-CoV-2 RNA is generally detectable in upper and lower respiratory specimens during the acute phase of infection. The lowest concentration of SARS-CoV-2 viral copies this assay can detect is 250 copies / mL. A negative result does not preclude SARS-CoV-2 infection and should not be used as the sole basis for treatment or other patient management decisions.  A negative result may occur with improper specimen collection / handling, submission of specimen other than nasopharyngeal swab, presence of viral mutation(s) within the areas targeted by this assay, and inadequate number of viral copies (<250 copies / mL). A negative result must be combined with clinical observations, patient history, and epidemiological information.  Fact Sheet for Patients:   BoilerBrush.com.cy  Fact Sheet for  Healthcare Providers: https://pope.com/  This test is not yet approved or  cleared by the [place] FDA and has been authorized for detection and/or diagnosis of SARS-CoV-2 by FDA under an Emergency Use Authorization (EUA).  This EUA will remain in effect (meaning this test can be used) for the duration of the COVID-19 declaration under Section 564(b)(1) of the Act, 21 U.S.C. section 360bbb-3(b)(1), unless the authorization is terminated or revoked sooner.  Performed at [facility], [address] [place], [place] [address]       Studies: CT Head Wo Contrast  Result Date: 03/01/2020 CLINICAL DATA:  Dizziness, headache, intracranial hemorrhage EXAM: CT HEAD WITHOUT CONTRAST TECHNIQUE: Contiguous axial images were obtained from the base of the skull through the vertex without intravenous contrast. COMPARISON:  02/24/2020 FINDINGS: Brain: Right occipital para falcine intraparenchymal hematoma remains stable, measuring 1.9 x 3.3 cm at axial image # 19. Intraventricular clot has  resolved in the interval. Tiny focus of intraparenchymal hemorrhage within the right parietal lobe appears stable at axial image # 18. Cytotoxic edema within the right occipital lobe and vasogenic edema within the right deep temporal and parietal lobes appears stable. No interval hemorrhage. No interval change in a mild mass effect with sulcal effacement involving the right cerebral hemisphere. Ventricular size is normal. Cerebellum is unremarkable. Vascular: No hyperdense vasculature at the skull base. Skull: Intact Sinuses/Orbits: The paranasal sinuses are clear. Ocular alignment appears divergent. [place] Stall are otherwise unremarkable. Other: Mastoid air cells and middle ear cavities are clear. IMPRESSION: Stable intraparenchymal hematoma within the right parietal and right occipital lobes. Resolved intraventricular clot. Stable mild mass effect and cerebral edema. No interval hemorrhage.  Electronically Signed   By: Helyn Numbers MD   On: 03/01/2020 18:12   MR Brain W and Wo Contrast  Result Date: 03/01/2020 CLINICAL DATA:  Headache, vomiting and dizziness EXAM: MRI HEAD WITHOUT AND WITH CONTRAST TECHNIQUE: Multiplanar, multiecho pulse sequences of the brain and surrounding structures were obtained without and with intravenous contrast. CONTRAST:  82mL GADAVIST GADOBUTROL 1 MMOL/ML IV SOLN COMPARISON:  head CT 03/01/2020 Brain MRI 01/14/2020 FINDINGS: Brain: Intraparenchymal hemorrhage of the right occipital lobe measures 3.6 x 2.3 cm, previously 3.3 x 1.9 cm. There is a punctate focus of acute ischemia in the right cerebellum. There are numerous petechial hemorrhages within the right occipital lobe and posterior temporal lobe, as well as a few in the left occipital lobe. There is moderate right parieto-occipital edema. Normal volume of CSF spaces. There is no abnormal contrast enhancement. Vascular: Normal flow voids. Skull and upper cervical spine: Normal marrow signal. Sinuses/Orbits: Negative. Other: None. IMPRESSION: 1. Slight increase in size of intraparenchymal hemorrhage of the right occipital lobe, now measuring 3.6 x 2.3 cm, previously 3.3 x 1.9 cm. 2. Numerous petechial hemorrhages within the right occipital lobe and posterior temporal lobe, as well as a few in the left occipital lobe. 3. Punctate focus of acute ischemia in the right cerebellum. Electronically Signed   By: Deatra Robinson M.D.   On: 03/01/2020 22:29    Scheduled Meds: .  stroke: mapping our early stages of recovery book   Does not apply Once  . aspirin EC  81 mg Oral Daily   Or  . aspirin  150 mg Rectal Daily  . levETIRAcetam  750 mg Oral BID    Continuous Infusions:   LOS: 1 day     Briant Cedar, MD [facility]  If 7PM-7AM, please contact night-coverage [email or web address] 03/02/2020, 3:40 PM

## 2020-03-02 NOTE — Consult Note (Signed)
Neurology Consultation Reason for Consult: Stroke Referring Physician: Roselyn Bering  CC: Dizziness  History is obtained from: Patient, family  HPI: Stacy Flores is a 64 y.o. female with a history of hypertension and recent intracranial hemorrhage who presents with dizziness that started Wednesday night.  She continued to be dizzy and unsteady with some nausea and vomiting on Thursday morning and due to this, she sought care in the emergency department.  Given her recent hemorrhage, imaging was obtained with a CT which demonstrates stability of her previous hemorrhage as well as an MRI which demonstrates a new small punctate infarct in the cerebellum consistent with her symptoms.  She denies double vision, or new weakness or numbness.   LKW: Wednesday night tpa given?: no, recent ICH   ROS: A 14 point ROS was performed and is negative except as noted in the HPI.   Past Medical History:  Diagnosis Date  . Hypertension   . Stroke [facility])      Family history: Stroke   Social History:  reports that she has quit smoking. She has never used smokeless tobacco. She reports previous alcohol use. She reports that she does not use drugs.   Exam: Current vital signs: BP (!) 167/96   Pulse 80   Temp 98.8 F (37.1 C) (Oral)   Resp 13   Ht 5\' 3"  (1.6 m)   Wt 72.1 kg   SpO2 98%   BMI 28.17 kg/m  Vital signs in last 24 hours: Temp:  [98.8 F (37.1 C)] 98.8 F (37.1 C) (07/22 1656) Pulse Rate:  [80-93] 80 (07/23 0200) Resp:  [13-25] 13 (07/23 0200) BP: (151-174)/(79-97) 167/96 (07/23 0200) SpO2:  [98 %-100 %] 98 % (07/23 0200) Weight:  [72.1 kg] 72.1 kg (07/22 1656)   Physical Exam  Constitutional: Appears well-developed and well-nourished.  Psych: Affect appropriate to situation Eyes: No scleral injection HENT: No OP obstrucion MSK: no joint deformities.  Cardiovascular: Normal rate and regular rhythm.  Respiratory: Effort normal, non-labored breathing GI: Soft.  No  distension. There is no tenderness.  Skin: WDI  Neuro: Mental Status: Patient is awake, alert, oriented to person, place, month, year, and situation. Patient is able to give a clear and coherent history. No signs of aphasia or neglect Cranial Nerves: II: Visual Fields are full. Pupils are equal, round, and reactive to light.   III,IV, VI: She has a left exotropia that was previously documented.  V: Facial sensation is symmetric to temperature VII: Facial movement with L weakjness VIII: hearing is intact to voice X: Uvula elevates symmetrically XI: Shoulder shrug is symmetric. XII: tongue is midline without atrophy or fasciculations.  Motor: Tone is normal. Bulk is normal. 5/5 strength was present in all four extremities to confrontation, mild left arm drif.t  Sensory: Sensation is symmetric to light touch and temperature in the arms and legs. Cerebellar: FNF intact bilaterally.    I have reviewed labs in epic and the results pertinent to this consultation are: BMP-unremarkable  I have reviewed the images obtained: CT head-improving hemorrhage, MRI-punctate stroke in the cerebellum, suspect that the difference between the CT and MRI as per the hemorrhage is due to technique.  Impression: 64 year old female with new punctate infarct with recent likely amyloid ICH.  Location could be embolic, and could raise the possibility that the recent hemorrhage was hemorrhagic infarct as opposed to amyloid, however given the appearance of the rest of the MRI, I think amyloid still remains most likely.  Recommendations: 1) no  antiplatelets or antithrombotics due to recent hemorrhage 2) continue Keppra 750 twice daily 3) PT, OT, ST 4) stroke team to follow   Ritta Slot, MD [facility] [phone]  If 7pm- 7am, please page neurology on call as listed in AMION.

## 2020-03-03 DIAGNOSIS — G40909 Epilepsy, unspecified, not intractable, without status epilepticus: Secondary | ICD-10-CM

## 2020-03-03 LAB — CBC WITH DIFFERENTIAL/PLATELET
Abs Immature Granulocytes: 0.04 10*3/uL (ref 0.00–0.07)
Basophils Absolute: 0 10*3/uL (ref 0.0–0.1)
Basophils Relative: 0 %
Eosinophils Absolute: 0 10*3/uL (ref 0.0–0.5)
Eosinophils Relative: 0 %
HCT: 40.3 % (ref 36.0–46.0)
Hemoglobin: 13.4 g/dL (ref 12.0–15.0)
Immature Granulocytes: 1 %
Lymphocytes Relative: 22 %
Lymphs Abs: 1.8 10*3/uL (ref 0.7–4.0)
MCH: 27.9 pg (ref 26.0–34.0)
MCHC: 33.3 g/dL (ref 30.0–36.0)
MCV: 83.8 fL (ref 80.0–100.0)
Monocytes Absolute: 0.9 10*3/uL (ref 0.1–1.0)
Monocytes Relative: 10 %
Neutro Abs: 5.6 10*3/uL (ref 1.7–7.7)
Neutrophils Relative %: 67 %
Platelets: 347 10*3/uL (ref 150–400)
RBC: 4.81 MIL/uL (ref 3.87–5.11)
RDW: 12.5 % (ref 11.5–15.5)
WBC: 8.3 10*3/uL (ref 4.0–10.5)
nRBC: 0 % (ref 0.0–0.2)

## 2020-03-03 LAB — BASIC METABOLIC PANEL
Anion gap: 10 (ref 5–15)
Anion gap: 8 (ref 5–15)
BUN: 10 mg/dL (ref 8–23)
BUN: 10 mg/dL (ref 8–23)
CO2: 23 mmol/L (ref 22–32)
CO2: 26 mmol/L (ref 22–32)
Calcium: 9.2 mg/dL (ref 8.9–10.3)
Calcium: 9.5 mg/dL (ref 8.9–10.3)
Chloride: 95 mmol/L — ABNORMAL LOW (ref 98–111)
Chloride: 101 mmol/L (ref 98–111)
Creatinine, Ser: 0.56 mg/dL (ref 0.44–1.00)
Creatinine, Ser: 0.77 mg/dL (ref 0.44–1.00)
GFR calc Af Amer: >60 mL/min (ref >60)
GFR calc Af Amer: >60 mL/min (ref >60)
GFR calc non Af Amer: >60 mL/min (ref >60)
GFR calc non Af Amer: >60 mL/min (ref >60)
Glucose, Bld: 120 mg/dL — ABNORMAL HIGH (ref 70–99)
Glucose, Bld: 104 mg/dL — ABNORMAL HIGH (ref 70–99)
Potassium: 2.9 mmol/L — ABNORMAL LOW (ref 3.5–5.1)
Potassium: 4.2 mmol/L (ref 3.5–5.1)
Sodium: 128 mmol/L — ABNORMAL LOW (ref 135–145)
Sodium: 135 mmol/L (ref 135–145)

## 2020-03-03 LAB — GLUCOSE, CAPILLARY
Glucose-Capillary: 118 mg/dL — ABNORMAL HIGH (ref 70–99)
Glucose-Capillary: 106 mg/dL — ABNORMAL HIGH (ref 70–99)
Glucose-Capillary: 145 mg/dL — ABNORMAL HIGH (ref 70–99)

## 2020-03-03 MED ORDER — SODIUM CHLORIDE 0.9 % IV SOLN: INTRAVENOUS | Status: DC

## 2020-03-03 MED ORDER — ASPIRIN 81 MG PO TBEC: 81.0000 mg | DELAYED_RELEASE_TABLET | Freq: Every day | ORAL | 0 refills | Status: DC

## 2020-03-03 MED ORDER — POTASSIUM CHLORIDE CRYS ER 20 MEQ PO TBCR
40.0000 meq | EXTENDED_RELEASE_TABLET | Freq: Two times a day (BID) | ORAL | Status: DC
  Administered 2020-03-03: 40 meq via ORAL
  Filled 2020-03-03: qty 2

## 2020-03-03 NOTE — Progress Notes (Addendum)
STROKE TEAM PROGRESS NOTE   INTERVAL HISTORY No family is at bedside. Pt lying in bed, doing well, at neuro baseline now. Denies any dizziness. PT/OT recommend [facility] PT/OT  Vitals:   03/02/20 2230 03/02/20 2256 03/03/20 0056 03/03/20 0320  BP: (!) 158/65 (!) 133/75 (!) 151/73 122/68  Pulse: 66 75 65 63  Resp: 14 16 14 16   Temp:  98 F (36.7 C) 98.3 F (36.8 C) 98.5 F (36.9 C)  TempSrc:  Oral Oral Oral  SpO2: 100% 98% 99% 99%  Weight:      Height:       CBC:  Recent Labs  Lab 03/01/20 1813 03/03/20 0250  WBC 8.9 8.3  NEUTROABS 7.5 5.6  HGB 13.2 13.4  HCT 41.0 40.3  MCV 88.2 83.8  PLT 337 347   Basic Metabolic Panel:  Recent Labs  Lab 03/01/20 1813 03/03/20 0250  NA 133* 128*  K 3.8 2.9*  CL 97* 95*  CO2 24 23  GLUCOSE 152* 120*  BUN 8 10  CREATININE 0.56 0.56  CALCIUM 9.7 9.2    IMAGING past 24 hours No results found.  PHYSICAL EXAM Pleasant middle-aged African-American lady not in distress. . Afebrile. Head is nontraumatic. Neck is supple without bruit.    Cardiac exam no murmur or gallop. Lungs are clear to auscultation. Distal pulses are well felt. Neurological Exam ;  Awake  Alert oriented x 3.  Diminished attention registration and recall.  Normal speech and language. eye movements full without nystagmus. fundi were not visualized. Vision acuity and fields appear normal. Hearing is normal. Palatal movements are normal. Face symmetric. Tongue midline. Normal strength, tone, reflexes and coordination. Normal sensation. Gait deferred.  ASSESSMENT/PLAN [name] is a 64 y.o. female with history of hypertension and recent intracranial hemorrhage presenting with dizziness since 7/21 pm, w/ nausea, vomiting and unsteady gait.   Stroke:   R cerebellar punctate infarct, likely small vessel disease  CT head stable R parietal and occipital hemorrhage, resolved intraventricular clot. No new hemorrhage.   MRI  R occipital ICH. numberous petechial  hemorrhage R occipital, post temporal lobe, and L occipital lobe. Punctate R cerebellar infarct   LDL 122  HgbA1c 5.7  VTE prophylaxis - SCDs ordered  No antithrombotic prior to admission given recent hemorrhage, now on ASA 81. Given hx of CAA and ICH, no other antithrombotics.  Therapy recommendations:  HH PT/OT   Disposition:  pending   Cerebral Amyloid Angiopathy w/ recurrent ICH  02/23/2020 - Right frontoparietal para-falcine ICH, likely due to known CAAwith recurrence adjacent to a previous hemorrhage    07/2019 - right frontoparietal WM ICH. MRA neg for AVM or aneurysm but consistent with CAA. CUS neg. EF 60-65%. LDL 93 and A1C 5.4. UDS neg. Recommend no antithrombotics and keep BP <130.   Currently on ASA 81 due to ischemic stroke, but no other antithrombotics more than ASA 81 recommended.   Pt still has high risk of recurrent ICH  BP goal < 140  Seizure  01/2020 admitted for seizure  Treated with keppra  Now on keppra - continue  Hypertension  Home meds:  norvasc 10  BP 150s  Put on norvasc . SBP goal < 140 due to CAA  Hyperlipidemia  Home meds:  None  LDL 122, goal < 70  Recommend no statin due to CAA with recurrent ICH  Dysphagia . Secondary to cerebellar stroke and nausea . NPO . Speech on board   Other Stroke Risk Factors  Quit Cigarette smoking, advised to continue to stop smoking  Hx ETOH use  confirmed no Family hx stroke   Other Active Problems    Hospital day # 2  Neurology will sign off. Please call with questions. Pt will follow up with stroke clinic NP at [facility] in about 4 weeks. Thanks for the consult.  Marvel Plan, MD PhD Stroke Neurology 03/03/2020 2:55 PM    To contact Stroke Continuity provider, please refer to [email or web address]. After hours, contact General Neurology

## 2020-03-03 NOTE — Evaluation (Signed)
Physical Therapy Evaluation Patient Details Name: Stacy Flores MRN: [id number] DOB: [birth year] Today's Date: 03/03/2020   History of Present Illness  Stacy Flores is a 64 yo female presenting with dizziness, nausea, and headache. Upon workup, imaging revealed slight increase in size of intraparenchymal hemorrhage of the occipital lobe and  punctate focus of acute ischemia in the right cerebellum. The pt was reccently hospitalized (7/15-7/18) for R frontoparietal ICH. PMH includes: prior ICH in dec 2020, seizures, and HTN.  Clinical Impression  Pt in bed upon arrival of PT, agreeable to evaluation at this time. Prior to admission the pt was independent without use of AD for mobility. The pt now presents with limitations in functional mobility, activity tolerance, strength, and dynamic stability that impact her ability to safely ambulate and complete self-care tasks independently. The pt was able to complete transfers and short ambulation in the room without AD, but requires minG for safety and reported feelings of dizziness, weakness, and fatigue following short bout of ambulation. The pt's vitals are recorded below. The pt will continue to benefit from skilled PT to further progress functional mobility, ambulation, and independence prior to return home.  VITALS:  - Supine (prior to session): 129/79 - Seated EOB - BP: 109/74; HR: 90bpm - Standing - BP: 88/70; HR: 120bpm - Sitting in recliner - 95/76; HR: 100bpm       Follow Up Recommendations Home health PT    Equipment Recommendations  None recommended by PT    Recommendations for Other Services       Precautions / Restrictions Precautions Precautions: Fall Precaution Comments: soft BP Restrictions Weight Bearing Restrictions: No      Mobility  Bed Mobility Overal bed mobility: Needs Assistance Bed Mobility: Supine to Sit     Supine to sit: Supervision     General bed mobility comments: pt able to come to sitting  EOB without assist, no use of bed rail  Transfers Overall transfer level: Needs assistance Equipment used: None Transfers: Sit to/from Stand Sit to Stand: Min guard         General transfer comment: minG for safety, pt able to complete 5x sts in 14.9 sec (cut off is 11.4 sec for increased risk of falls) with use of UE but no AD  Ambulation/Gait Ambulation/Gait assistance: Min guard Gait Distance (Feet): 20 Feet Assistive device: None Gait Pattern/deviations: Step-through pattern;Decreased stride length   Gait velocity interpretation: <1.8 ft/sec, indicate of risk for recurrent falls General Gait Details: pt with short, slowed steps today, cautious with movement in crowded room but able ot navigate without LOB or need for assist  Stairs            Wheelchair Mobility    Modified Rankin (Stroke Patients Only) Modified Rankin (Stroke Patients Only) Pre-Morbid Rankin Score: No significant disability Modified Rankin: Moderately severe disability (supervision for ambulation)     Balance Overall balance assessment: Mild deficits observed, not formally tested Sitting-balance support: No upper extremity supported;Feet supported Sitting balance-Leahy Scale: Good Sitting balance - Comments: supervision   Standing balance support: No upper extremity supported Standing balance-Leahy Scale: Fair Standing balance comment: pt able to ambulate without AD, but slow, cautious steps                             Pertinent Vitals/Pain Pain Assessment: (P) No/denies pain (initially denies pain; c/o mild headache later on) Faces Pain Scale: (P) No hurt Pain Location: (P) head Pain  Descriptors / Indicators: (P) Aching Pain Intervention(s): (P) Monitored during session;Repositioned;Other (comment) (nursing notified)    Home Living Family/patient expects to be discharged to:: (P) Private residence Living Arrangements: (P) Non-relatives/Friends Available Help at Discharge:  (P) Family;Friend(s);Available PRN/intermittently Type of Home: (P) House Home Access: (P) Stairs to enter Entrance Stairs-Rails: (P) None Entrance Stairs-Number of Steps: (P) 1 (threshold) Home Layout: (P) One level Home Equipment: (P) None      Prior Function Level of Independence: (P) Independent         Comments: (P) doesn't drive but goes with roommate for errands, otherwise fully independent     Hand Dominance   Dominant Hand: (P) Right    Extremity/Trunk Assessment   Upper Extremity Assessment Upper Extremity Assessment: Defer to OT evaluation    Lower Extremity Assessment Lower Extremity Assessment: Overall WFL for tasks assessed (slight weakness inL hip flexion, but generally functional)    Cervical / Trunk Assessment Cervical / Trunk Assessment: Kyphotic  Communication   Communication: (P) No difficulties  Cognition Arousal/Alertness: Awake/alert Behavior During Therapy: WFL for tasks assessed/performed Overall Cognitive Status: Within Functional Limits for tasks assessed                         Following Commands: (P) Follows multi-step commands consistently Safety/Judgement: (P)  (aware of dizziness and verbalizes need to sit down)     General Comments: pt pleasant and cooperative, A&O, able to voice needs      General Comments      Exercises     Assessment/Plan    PT Assessment Patient needs continued PT services  PT Problem List Decreased strength;Decreased activity tolerance;Decreased balance;Decreased mobility;Decreased knowledge of use of DME;Decreased safety awareness;Decreased knowledge of precautions;Pain       PT Treatment Interventions DME instruction;Gait training;Stair training;Functional mobility training;Therapeutic activities;Therapeutic exercise;Balance training;Neuromuscular re-education;Patient/family education    PT Goals (Current goals can be found in the Care Plan section)  Acute Rehab PT Goals Patient Stated  Goal: to go home PT Goal Formulation: With patient Time For Goal Achievement: 03/09/20 Potential to Achieve Goals: Good    Frequency Min 4X/week    AM-PAC PT "6 Clicks" Mobility  Outcome Measure Help needed turning from your back to your side while in a flat bed without using bedrails?: None Help needed moving from lying on your back to sitting on the side of a flat bed without using bedrails?: None Help needed moving to and from a bed to a chair (including a wheelchair)?: A Little Help needed standing up from a chair using your arms (e.g., wheelchair or bedside chair)?: A Little Help needed to walk in hospital room?: A Little Help needed climbing 3-5 steps with a railing? : A Little 6 Click Score: 20    End of Session Equipment Utilized During Treatment: Gait belt Activity Tolerance: Patient tolerated treatment well;Other (comment) (limited by pt reports of dizziness then weakness and fatigue, soft BP)   Nurse Communication: Mobility status (soft BP) PT Visit Diagnosis: Unsteadiness on feet (R26.81);Other abnormalities of gait and mobility (R26.89);Muscle weakness (generalized) (M62.81)    Time: 0814-4818 PT Time Calculation (min) (ACUTE ONLY): 20 min   Charges:   PT Evaluation $PT Eval Moderate Complexity: 1 Mod          Rolm Baptise, PT, DPT   Acute Rehabilitation Department Pager #: [phone]  Gaetana Michaelis 03/03/2020, 1:11 PM

## 2020-03-03 NOTE — TOC Transition Note (Signed)
Transition of Care [facility]) - CM/SW Discharge Note   Patient Details  Name: Kearsten Ginther MRN: [id number] Date of Birth: Nov 24, 1955  Transition of Care [facility]) CM/SW Contact:  Bess Kinds, RN Phone Number: [phone] 03/03/2020, 5:48 PM   Clinical Narrative:     Spoke with patient and daughters at bedside. Daughter, Claudia Desanctis, was on speaker phone. Discussed recommendations for [facility] PT and OT.   Daughters concerned d/t patient lack of insurance. 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 Patient was denied Medicaid in June d/t lack of justification for disability, however, financial counselor has assisted with new application.   Discussed charity opportunity for [facility] PT and OT through [facility]. Patient and daughters agreeable. MD placed HH orders.   Demographic and PCP information verified. No further TOC needs identified.   Final next level of care: Home w Home Health Services Barriers to Discharge: No Barriers Identified   Patient Goals and CMS Choice   CMS Medicare.gov Compare Post Acute Care list provided to:: Patient Choice offered to / list presented to : Adult Children, Patient  Discharge Placement                       Discharge Plan and Services                DME Arranged: N/A DME Agency: NA       HH Arranged: PT, OT HH Agency: [facility] Date [facility] Agency Contacted: 03/03/20 Time HH Agency Contacted: [id number] Representative spoke with at [facility] Agency: Kandee Keen  Social Determinants of Health (SDOH) Interventions     Readmission Risk Interventions No flowsheet data found.

## 2020-03-03 NOTE — Progress Notes (Signed)
OT Evaluation:   Clinical Impression: Pt presented to ED for evaluation of dizziness, N/V, and headache. Pt limited by decreased activity tolerance, strength, balance, and endurance. Prior to hospitalization, pt was Independent with ADLs/IADLs, except driving. Pt relied on roommate for driving. Today, pt received semi-reclined in bed, agreeable to OT eval. Pt c/o dizziness with change in positions, BP readings registered as the following: sitting EOB: 109/71mmhg, standing: 88/78mmhg, sitting EOB: 99/7mmhg, sitting in recliner: 95/3mmhg. HR increased up to 125bpm with movement/exertion. Notified nursing of drop in BP and dizziness. Pt was Mod I for bed mobility, min guard-supervision for functional transfers and ADL mobility throughout room with AD, mostly assistance with line management. Pt able to perform self-care activities with supervision-min guard at EOB and standing at sink. Pt is functioning below baseline. Pt would benefit from continued skilled acute OT services to address ADLs/IADLs, ADL mobility, endurance, strength, and energy conservation. Currently recommending HHOT with PRN S/A for safety purposes. Will continue to follow pt acutely as able.      03/03/20 1200  OT Visit Information  Last OT Received On 03/03/20  Assistance Needed +1  History of Present Illness Per Dr. Sharolyn Douglas- Stacy Flores is a 64 y.o. female with medical history significant for cerebral amyloid angiopathy with history of right frontoparietal ICH in December 2020 (residual visual deficits) and recurrent ICH requiring hospitalization 02/23/2020-02/26/2020, hypertension, hyperlipidemia, and seizure disorder who presents to the ED for evaluation of dizziness, nausea/vomiting, and headache.  History is somewhat limited from patient and is otherwise supplemented by daughter at bedside, EDP, and chart review. During last admission, pt had work-up with unremarkable CTA head and neck, echocardiogram without cardiac source of  emboli identified, and CT venogram of the head without evidence of intracranial venous thrombosis.  EEG 02/24/2020 showed cortical dysfunction in right hemisphere felt secondary to underlying hemorrhage as well as mild diffuse encephalopathy without seizures or definite epileptiform discharges seen. In the ED, VSS, labs fairly stable. MRI brain showed slight increase in size of intraparenchymal hemorrhage of the occipital lobe. Numerous petechial hemorrhages within the right septal lobe and posterior temporal lobe, left occipital lobe noted.  A punctate focus of acute ischemia in the right cerebellum also seen.  Neurology consulted.  Patient admitted for further management.  Precautions  Precautions Fall  Precaution Comments watch BP  Restrictions  Weight Bearing Restrictions No  Home Living  Family/patient expects to be discharged to: Private residence  Living Arrangements Non-relatives/Friends  Available Help at Discharge Family;Friend(s);Available PRN/intermittently  Type of Home House  Home Access Stairs to enter  Entrance Stairs-Number of Steps 1 (threshold)  Entrance Stairs-Rails None  Home Layout One level  Bathroom Shower/Tub Tub/shower unit;Curtain  Tour manager None   Lives With Friend(s)  Prior Function  Level of Independence Independent  Comments doesn't drive but goes with roommate for errands, otherwise fully independent  Communication  Communication No difficulties  Pain Assessment  Pain Assessment No/denies pain (initially denies pain; c/o mild headache later on)  Faces Pain Scale 0  Pain Location head  Pain Descriptors / Indicators Aching  Pain Intervention(s) Monitored during session;Repositioned;Other (comment) (nursing notified)  Cognition  Arousal/Alertness Awake/alert  Behavior During Therapy WFL for tasks assessed/performed  Overall Cognitive Status Within Functional Limits for tasks assessed  Following Commands Follows multi-step  commands consistently  Safety/Judgement  (aware of dizziness and verbalizes need to sit down)  General Comments slightly lethargic but otherwise oriented and able to follow multi-step command and  advocate for her own needs  Upper Extremity Assessment  Upper Extremity Assessment Overall WFL for tasks assessed  Lower Extremity Assessment  Lower Extremity Assessment Defer to PT evaluation  Cervical / Trunk Assessment  Cervical / Trunk Assessment Normal  ADL  Overall ADL's  Needs assistance/impaired  Eating/Feeding Sitting;Modified independent  Grooming Wash/dry hands;Wash/dry face;Min guard;Standing  Grooming Details (indicate cue type and reason) washed face and brushed teeth standing at sink with min guard for balance/safety with line management  Upper Body Bathing Set up  Lower Body Bathing Min guard  Upper Body Dressing  Set up;Min guard;Sitting  Upper Body Dressing Details (indicate cue type and reason) min guard for line management while donning hospital gown from behind  Lower Body Dressing Sitting/lateral leans;Modified independent  Lower Body Dressing Details (indicate cue type and reason) pt adjusted bilateral socks with mod I for extended time sitting EOB  Toilet Transfer Min guard;Ambulation  Toilet Transfer Details (indicate cue type and reason) simulated toilet transfer from bed>sink>recliner with min guard without AD  Toileting- Clothing Manipulation and Hygiene Supervision/safety;Sitting/lateral lean;Sit to/from stand  Retail buyer  (not assessed)  Functional mobility during ADLs Supervision/safety;Min guard  General ADL Comments pt able to perform self-care activities sitting EOB and standing within bedroom; min guard for safety and line management secondary to dizziness/overall weakness  Vision- History  Baseline Vision/History Wears glasses  Wears Glasses At all times  Patient Visual Report No change from baseline  Vision- Assessment  Additional Comments  reports residual visual deficits however glasses correct most of the time  Bed Mobility  Overal bed mobility Modified Independent  Bed Mobility Supine to Sit  Supine to sit Modified independent (Device/Increase time);HOB elevated  General bed mobility comments increased time  Transfers  Overall transfer level Needs assistance  Equipment used None  Transfers Sit to/from Stand  Sit to Stand Supervision;Min guard  General transfer comment for lines and safety  Balance  Overall balance assessment Mild deficits observed, not formally tested  Sitting-balance support No upper extremity supported;Feet supported  Sitting balance-Leahy Scale Good  Sitting balance - Comments supervision  Standing balance support No upper extremity supported;During functional activity  Standing balance-Leahy Scale Poor  Standing balance comment min guard for safety/line management  General Comments  General comments (skin integrity, edema, etc.) Pt c/o dizziness in change of positions; BP readings include- sitting: 109/74, standing: 88/71, sitting on bed: 99/74, sitting in chair: 95/76; nursing notified and instructed pt to call nursing if became more symptomatic  OT - End of Session  Equipment Utilized During Treatment Gait belt  Activity Tolerance Patient tolerated treatment well;Other (comment) (limited by dizziness and weakness)  Patient left in chair;with call bell/phone within reach;with chair alarm set  Nurse Communication Mobility status;Other (comment) (dizziness/low BP readings)  OT Assessment  OT Recommendation/Assessment Patient needs continued OT Services  OT Visit Diagnosis Muscle weakness (generalized) (M62.81);Unsteadiness on feet (R26.81);Dizziness and giddiness (R42);Pain  Pain - Right/Left  (head)  Pain - part of body  (head)  OT Problem List Decreased strength;Decreased activity tolerance;Impaired balance (sitting and/or standing);Pain  OT Plan  OT Frequency (ACUTE ONLY) Min 2X/week  OT  Treatment/Interventions (ACUTE ONLY) Self-care/ADL training;Therapeutic exercise;Energy conservation;Therapeutic activities;Patient/family education  AM-PAC OT "6 Clicks" Daily Activity Outcome Measure (Version 2)  Help from another person eating meals? 4  Help from another person taking care of personal grooming? 3  Help from another person toileting, which includes using toliet, bedpan, or urinal? 3  Help from another person bathing (including  washing, rinsing, drying)? 3  Help from another person to put on and taking off regular upper body clothing? 3  Help from another person to put on and taking off regular lower body clothing? 3  6 Click Score 19  OT Recommendation  Follow Up Recommendations Home health OT;Supervision - Intermittent  OT Equipment Tub/shower seat;Other (comment) (grab bars)  Individuals Consulted  Consulted and Agree with Results and Recommendations Patient  Acute Rehab OT Goals  Patient Stated Goal to go home and feel stronger  OT Goal Formulation With patient  Time For Goal Achievement 03/17/20  Potential to Achieve Goals Good  OT Time Calculation  OT Start Time (ACUTE ONLY) 0933  OT Stop Time (ACUTE ONLY) 0953  OT Time Calculation (min) 20 min  OT General Charges  $OT Visit 1 Visit  OT Evaluation  $OT Eval Moderate Complexity 1 Mod  Written Expression  Dominant Hand Right   Norris Cross, OTR/L [facility] Rehab Services [phone]

## 2020-03-03 NOTE — Discharge Summary (Signed)
Discharge Summary  Stacy Flores ZJI:[id number] DOB: [birth year]  PCP: Mina Marble, PA-C  Admit date: 03/01/2020 Discharge date: 03/03/2020  Time spent: 40 mins  Recommendations for Outpatient Follow-up:  1. PCP in 1 week 2. Neurology as scheduled    Discharge Diagnoses:  Active Hospital Problems   Diagnosis Date Noted  . Acute cerebrovascular accident (CVA) ([place]) 03/01/2020  . Seizure disorder ([place]) 03/01/2020  . Cerebral amyloid angiopathy (CODE) 07/25/2019  . Essential hypertension 07/25/2019  . Hyperlipidemia 07/25/2019    Resolved Hospital Problems  No resolved problems to display.    Discharge Condition: Stable  Diet recommendation: As tolerated  Vitals:   03/03/20 0824 03/03/20 1149  BP: 127/79 (!) 104/58  Pulse: 63 65  Resp: 13 18  Temp: 98 F (36.7 C) 98 F (36.7 C)  SpO2: 100% 100%    History of present illness:  Stacy Klare Dixonis a 64 y.o.femalewith medical history significant forcerebral amyloid angiopathy with history of right frontoparietal ICH in December 2020 (residual visual deficits) and recurrent ICH requiring hospitalization 02/23/2020-02/26/2020, hypertension, hyperlipidemia, and seizure disorder who presents to the ED for evaluation of dizziness, nausea/vomiting, and headache.History is somewhat limited from patient and is otherwise supplemented by daughter at bedside, EDP, and chart review. During last admission, pt had work-up with unremarkable CTA head and neck, echocardiogram without cardiac source of emboli identified, and CT venogram of the head without evidence of intracranial venous thrombosis. EEG 02/24/2020 showed cortical dysfunction in right hemisphere felt secondary to underlying hemorrhage as well as mild diffuse encephalopathy without seizures or definite epileptiform discharges seen. In the ED, VSS, labs fairly stable. MRI brain showed slight increase in size of intraparenchymal hemorrhage of the occipital lobe.  Numerous petechial hemorrhages within the right septal lobe and posterior temporal lobe, left occipital lobe noted. A punctate focus of acute ischemia in the right cerebellum also seen.  Neurology consulted.  Patient admitted for further management.    Today, patient denies any new complaints, patient noted to be more awake, more alert, met patient sitting and eating breakfast, reports feels a whole lot better.  Daughter at bedside and was very pleased with her turnaround.  Patient still noted to be orthostatic this afternoon, received IV fluids.  Advised daughter to ensure patient continues to eat and stay very hydrated.  PT/OT recommended home health PT/OT.  Patient has no insurance.  Case manager working with patient to see if she can qualify for charity.  Patient very eager to be discharged.  Denied any dizziness, chest pain, shortness of breath, abdominal pain, nausea/vomiting, headache.   Hospital Course:  Principal Problem:   Acute cerebrovascular accident (CVA) ([place]) Active Problems:   Cerebral amyloid angiopathy (CODE)   Essential hypertension   Hyperlipidemia   Seizure disorder ([facility])  Acute infarct of right cerebellum Seen on MRI brain Noted recent CTA head/neck, CT venogram head, echo Neurology consulted, recommend only aspirin 81 mg, no Plavix or statins PT/OT-recommend home health PT OT Follow-up with neurology as an outpatient  Noted orthostatic hypotension Likely 2/2 from poor oral intake Oral intake much improved, status post IV fluids Encourage daughter to ensure patient is adequately fed and hydrated Follow-up with PCP  Possible cerebral amyloid angiopathy with ICH Seen on MRI brain Follow-up with neurology as an outpatient  Hypertension Continue home amlodipine Follow-up with PCP  Hyperlipidemia No longer on statin as per neurology recommendation  Seizure disorder Continue Keppra       Malnutrition Type:  Malnutrition  Characteristics:      Nutrition Interventions:      Estimated body mass index is 28.17 kg/m as calculated from the following:   Height as of this encounter: 5' 3"  (1.6 m).   Weight as of this encounter: 72.1 kg.    Procedures:  None  Consultations:  Neurology  Discharge Exam: BP (!) 104/58 (BP Location: Left Arm)   Pulse 65   Temp 98 F (36.7 C) (Oral)   Resp 18   Ht 5' 3"  (1.6 m)   Wt 72.1 kg   SpO2 100%   BMI 28.17 kg/m   General: NAD Cardiovascular: S1, S2 present Respiratory: CTA B    Discharge Instructions You were cared for by a hospitalist during your hospital stay. If you have any questions about your discharge medications or the care you received while you were in the hospital after you are discharged, you can call the unit and asked to speak with the hospitalist on call if the hospitalist that took care of you is not available. Once you are discharged, your primary care physician will handle any further medical issues. Please note that NO REFILLS for any discharge medications will be authorized once you are discharged, as it is imperative that you return to your primary care physician (or establish a relationship with a primary care physician if you do not have one) for your aftercare needs so that they can reassess your need for medications and monitor your lab values.  Discharge Instructions    Ambulatory referral to Neurology   Complete by: As directed    Follow up with stroke clinic NP (Jessica Vanschaick or Cecille Rubin, if both not available, consider Zachery Dauer, or A[facility]) at [facility] in about 4 weeks. Thanks.   Diet - low sodium heart healthy   Complete by: As directed    Increase activity slowly   Complete by: As directed      Allergies as of 03/03/2020   No Known Allergies     Medication List    TAKE these medications   acetaminophen 500 MG tablet Commonly known as: TYLENOL Take 500 mg by mouth every 6 (six) hours as needed for  headache.   amLODipine 10 MG tablet Commonly known as: NORVASC Take 1 tablet (10 mg total) by mouth daily.   aspirin 81 MG EC tablet Take 1 tablet (81 mg total) by mouth daily. Swallow whole. Start taking on: March 04, 2020   diphenhydrAMINE 25 mg capsule Commonly known as: BENADRYL Take 25 mg by mouth every 6 (six) hours as needed.   levETIRAcetam 750 MG tablet Commonly known as: KEPPRA Take 1 tablet (750 mg total) by mouth 2 (two) times daily.      No Known Allergies  Follow-up Information    [facility]. Schedule an appointment as soon as possible for a visit in 4 week(s).   Specialty: Neurology Contact information: [address] [place] [phone]       Drosinis, Pamalee Leyden, PA-C. Schedule an appointment as soon as possible for a visit in 1 week(s).   Specialty: Internal Medicine Contact information: [place] [address] [phone]                The results of significant diagnostics from this hospitalization (including imaging, microbiology, ancillary and laboratory) are listed below for reference.    Significant Diagnostic Studies: CT Angio Head W or Wo Contrast  Result Date: 02/23/2020 CLINICAL DATA:  Cerebral hemorrhage suspected. EXAM: CT ANGIOGRAPHY HEAD AND NECK TECHNIQUE: Multidetector CT imaging of the head and neck was performed using the standard protocol during bolus administration of intravenous contrast. Multiplanar CT image reconstructions and MIPs were obtained to evaluate the vascular anatomy. Carotid stenosis measurements (when applicable) are obtained utilizing NASCET criteria, using the distal internal carotid diameter as the denominator. CONTRAST:  171m OMNIPAQUE IOHEXOL 350 MG/ML SOLN COMPARISON:  Noncontrast head CT performed earlier the same day 02/23/2019, MRI/MRA head 01/14/2020, MRA neck 01/14/2020. FINDINGS: CTA NECK FINDINGS Aortic arch: Common origin of the  innominate and left common carotid arteries. Mild atherosclerotic plaque within the visualized aortic arch and proximal major branch vessels of the neck. No hemodynamically significant innominate or proximal subclavian artery stenosis. Right carotid system: CCA and ICA patent within the neck without significant stenosis (50% or greater). Mild calcified plaque within the proximal ICA Left carotid system: CCA and ICA patent within the neck without significant stenosis (50% or greater). Mild calcified plaque within the proximal ICA. Vertebral arteries: Codominant and patent within the neck without significant stenosis. Skeleton: No acute bony abnormality. Cervical spondylosis without high-grade bony spinal canal narrowing. C4-C5 grade 1 anterolisthesis. Other neck: No neck mass or cervical lymphadenopathy. Upper chest: No consolidation within the imaged lung apices. Review of the MIP images confirms the above findings CTA HEAD FINDINGS Anterior circulation: The intracranial internal carotid arteries are patent. Mild atherosclerotic narrowing of the cavernous segment on the right. The M1 middle cerebral arteries are patent without significant stenosis. No M2 proximal branch occlusion or high-grade proximal stenosis is identified. The anterior cerebral arteries are patent. No aneurysm or arteriovenous malformation is identified. Posterior circulation: The intracranial vertebral arteries are patent without significant stenosis, as is the basilar artery. The posterior cerebral arteries are patent without significant proximal stenosis. A small left posterior communicating artery is present. The right posterior communicating artery is hypoplastic or absent. Venous sinuses: Within limitations of contrast timing, no convincing thrombus. Anatomic variants: As described Review of the MIP images confirms the above findings IMPRESSION: CTA neck: The common carotid, internal carotid and vertebral arteries are patent within the neck  without hemodynamically significant stenosis. Mild atherosclerotic disease as described. CTA head: 1. No intracranial aneurysm or AVM is identified. Follow-up CT or MR angiography following resolution of the right parietal hematoma can be considered for further evaluation, although no aneurysm or AVM was identified on the prior MRA of 01/14/2020. 2. No intracranial large vessel occlusion or proximal high-grade arterial stenosis. Electronically Signed   By: KKellie SimmeringDO   On: 02/23/2020 17:32   CT Head Wo Contrast  Result Date: 03/01/2020 CLINICAL DATA:  Dizziness, headache, intracranial hemorrhage EXAM: CT HEAD WITHOUT CONTRAST TECHNIQUE: Contiguous axial images were obtained from the base of the skull through the vertex without intravenous contrast. COMPARISON:  02/24/2020 FINDINGS: Brain: Right occipital para falcine intraparenchymal hematoma remains stable, measuring 1.9 x 3.3 cm at axial image # 19. Intraventricular clot has resolved in the interval. Tiny focus of intraparenchymal hemorrhage within the right parietal lobe appears stable at axial image # 18. Cytotoxic edema within the right occipital lobe and vasogenic edema within the right deep temporal and parietal lobes appears stable. No interval hemorrhage. No interval change in a mild mass effect with sulcal effacement involving the right cerebral hemisphere. Ventricular size is normal. Cerebellum is unremarkable. Vascular: No hyperdense vasculature at the skull base. Skull: Intact Sinuses/Orbits: The paranasal sinuses are clear. Ocular alignment appears divergent. ODarliss Ridgelare otherwise unremarkable.  Other: Mastoid air cells and middle ear cavities are clear. IMPRESSION: Stable intraparenchymal hematoma within the right parietal and right occipital lobes. Resolved intraventricular clot. Stable mild mass effect and cerebral edema. No interval hemorrhage. Electronically Signed   By: Fidela Salisbury MD   On: 03/01/2020 18:12   CT HEAD WO  CONTRAST  Result Date: 02/24/2020 CLINICAL DATA:  Intracranial hemorrhage, follow-up EXAM: CT HEAD WITHOUT CONTRAST TECHNIQUE: Contiguous axial images were obtained from the base of the skull through the vertex without intravenous contrast. COMPARISON:  02/23/2020 FINDINGS: Brain: Parenchymal hemorrhage centered within the right parietal lobe again identified with surrounding edema and mild regional mass effect. There is no new hemorrhage or evidence of intraventricular extension. Gray-white differentiation is preserved. No hydrocephalus. Vascular: No new finding. Skull: Calvarium is unremarkable. Sinuses/Orbits: No acute finding. Other: None. IMPRESSION: Similar appearance of parenchymal hemorrhage centered within the right parietal lobe. No new hemorrhage or worsening mass effect. Electronically Signed   By: Macy Mis M.D.   On: 02/24/2020 18:26   CT Head Wo Contrast  Result Date: 02/23/2020 CLINICAL DATA:  Headache, forehead pain for 2 days, near syncope EXAM: CT HEAD WITHOUT CONTRAST TECHNIQUE: Contiguous axial images were obtained from the base of the skull through the vertex without intravenous contrast. COMPARISON:  01/13/2020 FINDINGS: Brain: There is a new area of intraparenchymal hemorrhage within the right parietal convexity, measuring 4.7 x 3.8 cm in transverse direction and extending approximately 4.8 cm in craniocaudal length. There is significant surrounding edema, with effacement of the right lateral ventricle. Basilar cisterns are preserved. There is no midline shift. Vascular: No hyperdense vessel or unexpected calcification. Skull: Normal. Negative for fracture or focal lesion. Sinuses/Orbits: No acute finding. Other: None. IMPRESSION: 1. Large right parietal intraparenchymal hemorrhage measuring 4.8 x 4.7 x 3.8 cm. Significant surrounding edema with effacement of the right lateral ventricle. These results were called by telephone at the time of interpretation on 02/23/2020 at 3:59 pm to  provider DAN FLOYD , who verbally acknowledged these results. Electronically Signed   By: Randa Ngo M.D.   On: 02/23/2020 16:05   CT Angio Neck W and/or Wo Contrast  Result Date: 02/23/2020 CLINICAL DATA:  Cerebral hemorrhage suspected. EXAM: CT ANGIOGRAPHY HEAD AND NECK TECHNIQUE: Multidetector CT imaging of the head and neck was performed using the standard protocol during bolus administration of intravenous contrast. Multiplanar CT image reconstructions and MIPs were obtained to evaluate the vascular anatomy. Carotid stenosis measurements (when applicable) are obtained utilizing NASCET criteria, using the distal internal carotid diameter as the denominator. CONTRAST:  124m OMNIPAQUE IOHEXOL 350 MG/ML SOLN COMPARISON:  Noncontrast head CT performed earlier the same day 02/23/2019, MRI/MRA head 01/14/2020, MRA neck 01/14/2020. FINDINGS: CTA NECK FINDINGS Aortic arch: Common origin of the innominate and left common carotid arteries. Mild atherosclerotic plaque within the visualized aortic arch and proximal major branch vessels of the neck. No hemodynamically significant innominate or proximal subclavian artery stenosis. Right carotid system: CCA and ICA patent within the neck without significant stenosis (50% or greater). Mild calcified plaque within the proximal ICA Left carotid system: CCA and ICA patent within the neck without significant stenosis (50% or greater). Mild calcified plaque within the proximal ICA. Vertebral arteries: Codominant and patent within the neck without significant stenosis. Skeleton: No acute bony abnormality. Cervical spondylosis without high-grade bony spinal canal narrowing. C4-C5 grade 1 anterolisthesis. Other neck: No neck mass or cervical lymphadenopathy. Upper chest: No consolidation within the imaged lung apices. Review of the MIP images  confirms the above findings CTA HEAD FINDINGS Anterior circulation: The intracranial internal carotid arteries are patent. Mild  atherosclerotic narrowing of the cavernous segment on the right. The M1 middle cerebral arteries are patent without significant stenosis. No M2 proximal branch occlusion or high-grade proximal stenosis is identified. The anterior cerebral arteries are patent. No aneurysm or arteriovenous malformation is identified. Posterior circulation: The intracranial vertebral arteries are patent without significant stenosis, as is the basilar artery. The posterior cerebral arteries are patent without significant proximal stenosis. A small left posterior communicating artery is present. The right posterior communicating artery is hypoplastic or absent. Venous sinuses: Within limitations of contrast timing, no convincing thrombus. Anatomic variants: As described Review of the MIP images confirms the above findings IMPRESSION: CTA neck: The common carotid, internal carotid and vertebral arteries are patent within the neck without hemodynamically significant stenosis. Mild atherosclerotic disease as described. CTA head: 1. No intracranial aneurysm or AVM is identified. Follow-up CT or MR angiography following resolution of the right parietal hematoma can be considered for further evaluation, although no aneurysm or AVM was identified on the prior MRA of 01/14/2020. 2. No intracranial large vessel occlusion or proximal high-grade arterial stenosis. Electronically Signed   By: Kellie Simmering DO   On: 02/23/2020 17:32   MR Brain W and Wo Contrast  Result Date: 03/01/2020 CLINICAL DATA:  Headache, vomiting and dizziness EXAM: MRI HEAD WITHOUT AND WITH CONTRAST TECHNIQUE: Multiplanar, multiecho pulse sequences of the brain and surrounding structures were obtained without and with intravenous contrast. CONTRAST:  49m GADAVIST GADOBUTROL 1 MMOL/ML IV SOLN COMPARISON:  head CT 03/01/2020 Brain MRI 01/14/2020 FINDINGS: Brain: Intraparenchymal hemorrhage of the right occipital lobe measures 3.6 x 2.3 cm, previously 3.3 x 1.9 cm. There is a  punctate focus of acute ischemia in the right cerebellum. There are numerous petechial hemorrhages within the right occipital lobe and posterior temporal lobe, as well as a few in the left occipital lobe. There is moderate right parieto-occipital edema. Normal volume of CSF spaces. There is no abnormal contrast enhancement. Vascular: Normal flow voids. Skull and upper cervical spine: Normal marrow signal. Sinuses/Orbits: Negative. Other: None. IMPRESSION: 1. Slight increase in size of intraparenchymal hemorrhage of the right occipital lobe, now measuring 3.6 x 2.3 cm, previously 3.3 x 1.9 cm. 2. Numerous petechial hemorrhages within the right occipital lobe and posterior temporal lobe, as well as a few in the left occipital lobe. 3. Punctate focus of acute ischemia in the right cerebellum. Electronically Signed   By: KUlyses JarredM.D.   On: 03/01/2020 22:29   EEG adult  Result Date: 02/24/2020 YLora Havens MD     02/24/2020 10:32 AM Patient Name: BSigna CheekMRN: [id number] Attending: PLora HavensReferring Physician/Provider: Dr EKerney ElbeDate: 02/24/2020 Duration: 23.32 mins Patient history: 64year old female with h/o seizures now with acute right parietal-frontal ICH. EEG to evaluate for seizure. Level of alertness: Awake, asleep AEDs during EEG study: LEV Technical aspects: This EEG study was done with scalp electrodes positioned according to the 10-20 International system of electrode placement. Electrical activity was acquired at a sampling rate of 500Hz  and reviewed with a high frequency filter of 70Hz  and a low frequency filter of 1Hz . EEG data were recorded continuously and digitally stored. Description: The posterior dominant rhythm consists of 9 Hz activity of moderate voltage (25-35 uV) seen predominantly in posterior head regions, asymmetric ( R<L) and reactive to eye opening and eye closing. Drowsiness was characterized  by attenuation of the posterior background rhythm.  Sleep was characterized by vertex waves, sleep spindles (12 to 14 Hz), maximal frontocentral region.  EEG showed intermittent generalized 2-3 Hz delta slowing as well as continuous 3-6hz  theta-delta slowing in right hemisphere, maximal right posterior quadrant. Sharp transients were also seen in right parietal region.  Hyperventilation and photic stimulation were not performed.   ABNORMALITY -Intermittent slow, generalized -Continuous slow, right hemisphere, maximal right posterior quadrant - Background asymmetry, R<L IMPRESSION: This study is suggestive of cortical dysfunction in right hemisphere, maximal right posterior quadrant likely secondary to underlying hemorrhage as well as mild diffuse encephalopathy, nonspecific etiology. No seizures or definite epileptiform discharges were seen throughout the recording. Lora Havens   ECHOCARDIOGRAM COMPLETE  Result Date: 02/24/2020    ECHOCARDIOGRAM REPORT   Patient Name:   Odesser DAVIS Sasso Date of Exam: 02/24/2020 Medical Rec #:  [id number]          Height:       63.0 in Accession #:    [id number]         Weight:       159.0 lb Date of Birth:  Nov 27, 1955         BSA:          1.754 m Patient Age:    64 years           BP:           129/79 mmHg Patient Gender: F                  HR:           72 bpm. Exam Location:  Inpatient Procedure: 2D Echo Indications:    stroke 434.91  History:        Patient has prior history of Echocardiogram examinations, most                 recent 07/23/2019. Risk Factors:Hypertension and Dyslipidemia.  Sonographer:    Johny Chess Referring Phys: [id number] [place] XU IMPRESSIONS  1. Left ventricular ejection fraction, by estimation, is 60 to 65%. The left ventricle has normal function. The left ventricle has no regional wall motion abnormalities. Left ventricular diastolic parameters are consistent with Grade I diastolic dysfunction (impaired relaxation).  2. Right ventricular systolic function is normal. The right ventricular  size is normal.  3. The mitral valve is normal in structure. Trivial mitral valve regurgitation. No evidence of mitral stenosis.  4. The aortic valve is normal in structure. Aortic valve regurgitation is not visualized. No aortic stenosis is present.  5. The inferior vena cava is normal in size with greater than 50% respiratory variability, suggesting right atrial pressure of 3 mmHg. Conclusion(s)/Recommendation(s): No intracardiac source of embolism detected on this transthoracic study. A transesophageal echocardiogram is recommended to exclude cardiac source of embolism if clinically indicated. FINDINGS  Left Ventricle: Left ventricular ejection fraction, by estimation, is 60 to 65%. The left ventricle has normal function. The left ventricle has no regional wall motion abnormalities. The left ventricular internal cavity size was normal in size. There is  no left ventricular hypertrophy. Left ventricular diastolic parameters are consistent with Grade I diastolic dysfunction (impaired relaxation). Right Ventricle: The right ventricular size is normal. No increase in right ventricular wall thickness. Right ventricular systolic function is normal. Left Atrium: Left atrial size was normal in size. Right Atrium: Right atrial size was normal in size. Pericardium: There is no evidence of pericardial effusion. Mitral Valve: The mitral valve  is normal in structure. Normal mobility of the mitral valve leaflets. Trivial mitral valve regurgitation. No evidence of mitral valve stenosis. Tricuspid Valve: The tricuspid valve is normal in structure. Tricuspid valve regurgitation is not demonstrated. No evidence of tricuspid stenosis. Aortic Valve: The aortic valve is normal in structure. Aortic valve regurgitation is not visualized. No aortic stenosis is present. Pulmonic Valve: The pulmonic valve was normal in structure. Pulmonic valve regurgitation is not visualized. No evidence of pulmonic stenosis. Aorta: The aortic root is  normal in size and structure. Venous: The inferior vena cava is normal in size with greater than 50% respiratory variability, suggesting right atrial pressure of 3 mmHg. IAS/Shunts: No atrial level shunt detected by color flow Doppler.  LEFT VENTRICLE PLAX 2D LVIDd:         4.60 cm LVIDs:         3.40 cm LV PW:         0.90 cm LV IVS:        0.70 cm LVOT diam:     2.10 cm LV SV:         69 LV SV Index:   39 LVOT Area:     3.46 cm  RIGHT VENTRICLE             IVC RV S prime:     14.70 cm/s  IVC diam: 1.30 cm TAPSE (M-mode): 1.7 cm LEFT ATRIUM             Index       RIGHT ATRIUM           Index LA diam:        3.30 cm 1.88 cm/m  RA Area:     10.20 cm LA Vol (A2C):   50.4 ml 28.73 ml/m RA Volume:   22.00 ml  12.54 ml/m LA Vol (A4C):   37.1 ml 21.15 ml/m LA Biplane Vol: 44.4 ml 25.31 ml/m  AORTIC VALVE LVOT Vmax:   96.00 cm/s LVOT Vmean:  64.800 cm/s LVOT VTI:    0.198 m  AORTA Ao Asc diam: 2.80 cm MITRAL VALVE MV Area (PHT): 2.99 cm    SHUNTS MV Decel Time: 254 msec    Systemic VTI:  0.20 m MV E velocity: 64.30 cm/s  Systemic Diam: 2.10 cm MV A velocity: 80.10 cm/s MV E/A ratio:  0.80 Candee Furbish MD Electronically signed by Candee Furbish MD Signature Date/Time: 02/24/2020/12:58:03 PM    Final    CT VENOGRAM HEAD  Result Date: 02/23/2020 CLINICAL DATA:  Provided history: Brain bleed. EXAM: CT VENOGRAM HEAD TECHNIQUE: CT venography of the head was performed following the administration 100 mL Omnipaque 350 intravenous contrast. Coronal and sagittal reconstructions were also submitted for evaluation. Additionally, axial, coronal and sagittal MIP reconstructions were submitted for evaluation. CONTRAST:  139m OMNIPAQUE IOHEXOL 350 MG/ML SOLN COMPARISON:  Same day non-contrast head CT and CT angiogram head/neck 02/23/2020 FINDINGS: The superior sagittal sinus, internal cerebral veins, vein of Galen, straight sinus, transverse sinuses, sigmoid sinuses and visualized jugular veins are patent. There is no appreciable  intracranial venous thrombosis. A large right parietal parenchymal hematoma is grossly unchanged in size. Redemonstrated effacement of the posterior right lateral ventricle. No midline shift. No abnormal parenchymal enhancement is identified. IMPRESSION: No evidence of intracranial venous thrombosis. Electronically Signed   By: KKellie SimmeringDO   On: 02/23/2020 17:41    Microbiology: Recent Results (from the past 240 hour(s))  SARS Coronavirus 2 by RT PCR (hospital order, performed in  [facility] hospital lab) Nasopharyngeal Nasopharyngeal Swab     Status: None   Collection Time: 02/23/20  4:30 PM   Specimen: Nasopharyngeal Swab  Result Value Ref Range Status   SARS Coronavirus 2 NEGATIVE NEGATIVE Final    Comment: (NOTE) SARS-CoV-2 target nucleic acids are NOT DETECTED.  The SARS-CoV-2 RNA is generally detectable in upper and lower respiratory specimens during the acute phase of infection. The lowest concentration of SARS-CoV-2 viral copies this assay can detect is 250 copies / mL. A negative result does not preclude SARS-CoV-2 infection and should not be used as the sole basis for treatment or other patient management decisions.  A negative result may occur with improper specimen collection / handling, submission of specimen other than nasopharyngeal swab, presence of viral mutation(s) within the areas targeted by this assay, and inadequate number of viral copies (<250 copies / mL). A negative result must be combined with clinical observations, patient history, and epidemiological information.  Fact Sheet for Patients:   StrictlyIdeas.no  Fact Sheet for Healthcare Providers: BankingDealers.co.za  This test is not yet approved or  cleared by the [place] FDA and has been authorized for detection and/or diagnosis of SARS-CoV-2 by FDA under an Emergency Use Authorization (EUA).  This EUA will remain in effect (meaning this test can be  used) for the duration of the COVID-19 declaration under Section 564(b)(1) of the Act, 21 U.S.C. section 360bbb-3(b)(1), unless the authorization is terminated or revoked sooner.  Performed at [facility], [address] [place], [place] [address]   MRSA PCR Screening     Status: None   Collection Time: 02/23/20 11:33 PM   Specimen: Nasal Mucosa; Nasopharyngeal  Result Value Ref Range Status   MRSA by PCR NEGATIVE NEGATIVE Final    Comment:        The GeneXpert MRSA Assay (FDA approved for NASAL specimens only), is one component of a comprehensive MRSA colonization surveillance program. It is not intended to diagnose MRSA infection nor to guide or monitor treatment for MRSA infections. Performed at [facility], [place] [address] [place], [place] [address]   SARS Coronavirus 2 by RT PCR (hospital order, performed in [facility] hospital lab) Nasopharyngeal Nasopharyngeal Swab     Status: None   Collection Time: 03/02/20 12:17 AM   Specimen: Nasopharyngeal Swab  Result Value Ref Range Status   SARS Coronavirus 2 NEGATIVE NEGATIVE Final    Comment: (NOTE) SARS-CoV-2 target nucleic acids are NOT DETECTED.  The SARS-CoV-2 RNA is generally detectable in upper and lower respiratory specimens during the acute phase of infection. The lowest concentration of SARS-CoV-2 viral copies this assay can detect is 250 copies / mL. A negative result does not preclude SARS-CoV-2 infection and should not be used as the sole basis for treatment or other patient management decisions.  A negative result may occur with improper specimen collection / handling, submission of specimen other than nasopharyngeal swab, presence of viral mutation(s) within the areas targeted by this assay, and inadequate number of viral copies (<250 copies / mL). A negative result must be combined with clinical observations, patient history, and epidemiological information.  Fact Sheet for Patients:    StrictlyIdeas.no  Fact Sheet for Healthcare Providers: BankingDealers.co.za  This test is not yet approved or  cleared by the [place] FDA and has been authorized for detection and/or diagnosis of SARS-CoV-2 by FDA under an Emergency Use Authorization (EUA).  This EUA will remain in effect (meaning this  test can be used) for the duration of the COVID-19 declaration under Section 564(b)(1) of the Act, 21 U.S.C. section 360bbb-3(b)(1), unless the authorization is terminated or revoked sooner.  Performed at [facility], [address], [place],  [address]      Labs: Basic Metabolic Panel: Recent Labs  Lab 02/26/20 0406 03/01/20 1813 03/03/20 0250 03/03/20 1525  NA 138 133* 128* 135  K 3.8 3.8 2.9* 4.2  CL 103 97* 95* 101  CO2 27 24 23 26   GLUCOSE 110* 152* 120* 104*  BUN 15 8 10 10   CREATININE 0.89 0.56 0.56 0.77  CALCIUM 9.7 9.7 9.2 9.5   Liver Function Tests: No results for input(s): AST, ALT, ALKPHOS, BILITOT, PROT, ALBUMIN in the last 168 hours. No results for input(s): LIPASE, AMYLASE in the last 168 hours. No results for input(s): AMMONIA in the last 168 hours. CBC: Recent Labs  Lab 02/26/20 0406 03/01/20 1813 03/03/20 0250  WBC 7.8 8.9 8.3  NEUTROABS  --  7.5 5.6  HGB 11.7* 13.2 13.4  HCT 37.3 41.0 40.3  MCV 88.8 88.2 83.8  PLT 282 337 347   Cardiac Enzymes: No results for input(s): CKTOTAL, CKMB, CKMBINDEX, TROPONINI in the last 168 hours. BNP: BNP (last 3 results) No results for input(s): BNP in the last 8760 hours.  ProBNP (last 3 results) No results for input(s): PROBNP in the last 8760 hours.  CBG: Recent Labs  Lab 03/03/20 0827 03/03/20 1147  GLUCAP 118* 106*       Signed:  Alma Friendly, MD [facility] 03/03/2020, 4:03 PM

## 2020-03-03 NOTE — Progress Notes (Signed)
  Discharge instructions discussed with patient and daughter at bedside. All questions answered. IV removed, Telemetry discontinued. Clothing, cellphone and eye glasses returned to patient. Patient wheeled off unit for transport home by daughter. Melony Overly, RN

## 2020-03-06 ENCOUNTER — Other Ambulatory Visit: Payer: Self-pay | Admitting: *Deleted

## 2020-03-06 NOTE — Patient Outreach (Signed)
[facility]) Care Management  03/06/2020  Stacy Flores 1956/01/18 [id number]   Subjective: Telephone call to patient's home / mobile number, spoke with patient, and HIPAA verified.  Discussed [facility] Care Management Medicaid Pending EMMI Stroke Red Alert Flag follow up, patient voiced understanding, and is in agreement to follow up.  Patient states she is doing pretty good, her memory is bad since the strokes, can not remember much, and requested this RNCM complete assessment with daughter Stacy Flores).  Patient gave RNCM verbal consent to speak with daughter Stacy Flores) as needed regarding her healthcare needs.     Objective:Per KPN (Knowledge Performance Now, point of care tool) and chart review, patient hospitalized 03/01/2020 - 03/03/2020, with Acute infarct of right cerebellum, Acute CVA.    Patient hospitalized on 02/13/2020 - 02/26/2020 for ICH and on 01/13/2020 - 01/15/2020 for seizure.   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     Assessment: Received Self Pay EMMI Stroke referral on 03/02/2020.  Red Flag Alert Trigger, Day #3, patient answered yes to the following question: Feeling worse overall?  [facility] EMMI follow up pending patient's daughter contact.      Plan: RNCM will send unsuccessful outreach letter, [facility] pamphlet, handout: Know Before You Go, will call patient's daughter for 2nd telephone outreach attempt within 4 business days, [facility] EMMI follow up, and proceed with case closure, after 4th unsuccessful outreach call.       Stacy Flores H. Gardiner Barefoot, BSN, CCM [facility] Care Management [facility] Telephonic CM Phone: [phone] Fax: [phone]

## 2020-03-09 ENCOUNTER — Ambulatory Visit: Payer: Self-pay | Admitting: *Deleted

## 2020-03-12 ENCOUNTER — Encounter: Payer: Self-pay | Admitting: *Deleted

## 2020-03-12 ENCOUNTER — Ambulatory Visit: Payer: Self-pay | Admitting: *Deleted

## 2020-03-12 ENCOUNTER — Other Ambulatory Visit: Payer: Self-pay | Admitting: *Deleted

## 2020-03-12 NOTE — Patient Outreach (Signed)
[facility]) Care Management  03/12/2020  Stacy Flores August 13, 1955 [id number]   Subjective:  Per patient's previous verbal consent. Telephone call to patient's daughter [name])  home number, spoke with daughter, and HIPAA verified.  Discussed [facility] Care Management Self Pay / Medicaid Pending referral  follow up, daughter voiced understanding, and is in agreement to follow up on patient's behalf.    Daughter states patient is doing great, feeling much better, no headache since last admission, and no outpatient therapy needs.   States she has not received a follow up call from neurologist office regarding follow up visit.   States she will call neurologist office today to schedule follow up appointment.    Discussed importance of hospital follow up with primary MD, daughter voices understanding, states she will follow up as appropriate, and call office today to schedule follow up.  States patient primary care provider is currently on maternity leave and will call to schedule follow up with another provider in the office. Daughter states patient  is able to manage self care and has assistance as needed.  Daughter voices understanding of medical diagnosis and treatment plan.  Daughter states that she applied for Medicaid on patient's behalf in the past (January of 2021) and was denied.   States she will follow up with hospital Medicaid contact person to verify status up patient's current  Medicaid application, will very benefits once application is approved, as needed via member services number on back of card.    Daughter states patient may have transportation needs in the future and will notify [facility] Care Management if services needed.   Discussed Advanced Directives, advised of [employer] document scanning  system via local providers office or hospitals, daughter voices understanding, and she will discuss accessing through patient's primary provider if needed in the future.  Daughter  states patient does not have any  EMMI follow up, care coordination, care management, disease monitoring, transportation, community resource, or pharmacy needs at this time.  States she is very appreciative of the follow up, is in agreement  to receive 1 additional follow up call to assess for further CM needs, and is in agreement to receive [facility] Care Management EMMI follow up calls as needed on patient's behalf.   Daughter is in need of educational materials regarding secondary strokes, and is in agreement to receive the following EMMI educational video: Stroke - Preventing Secondary Stroke.  RNCM provided contact name and number: [phone] and advised daughter to contact RNCM if needed prior to next patient outreach.      Objective:Per KPN (Knowledge Performance Now, point of care tool) and chart review,patient hospitalized 03/01/2020 - 03/03/2020, withAcute infarct of right cerebellum, Acute CVA. Patient hospitalized on 02/13/2020 - 02/26/2020 for ICH and on 01/13/2020 - 01/15/2020 for seizure. 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     Assessment: Received Self Pay EMMI Stroke referral on 03/02/2020. Red Flag Alert Trigger, Day #3, patient answered yes to the following question: Feeling worse overall? EMMI follow up completed and will follow up to assess further care management needs.      Plan: RNCM will send patient's daughter the following EMMI educational video: Stroke - Preventing Secondary Stroke, per her request to email: [email or web address].  RNCM will call patient's daughter  for telephone outreach attempt, within 30  business days, EMMI  follow up, to assess for further CM needs, and proceed with case closure, after 4th unsuccessful  outreach call.        Yoseph Haile H. Gardiner Barefoot, BSN, CCM [facility] Care Management [facility] Telephonic CM Phone: [phone] Fax: [phone]      l

## 2020-04-02 ENCOUNTER — Other Ambulatory Visit: Payer: Self-pay | Admitting: *Deleted

## 2020-04-02 ENCOUNTER — Ambulatory Visit: Payer: Self-pay | Admitting: *Deleted

## 2020-04-02 NOTE — Patient Outreach (Signed)
[facility]) Care Management  04/02/2020  Avalin Briley 10-13-1955 [id number]   Subjective:  Per patient's previous verbal consent. Telephone call to patient's daughter Stacy Flores)  home number, spoke with daughter, stated patient's name, date of birth, and address.  States patient is doing great, will have a follow up appointment with primary MD on 04/13/2020, and primary MD will set up follow up appointment with neurologist, per neurologist.    Daughter states she has received update from Department of Social Services verifying Medicaid application is received, being reviewed, and no determination to date.  Daughter states she did receive the following EMMI educational video: Stroke - Preventing Secondary Stroke, has not had a chance to review, and will notify this RNCM with any questions.  Daughter states patient does not have any education material, EMMI follow up, care coordination, care management, disease monitoring, transportation, community resource, or pharmacy needs at this time. States she is very appreciative of the follow up, is in agreement  to receive 1 additional follow up call to assess for further CM needs, and is in agreement to receive [facility] Care Management EMMI follow up calls as needed, on patient's behalf.     Objective:Per KPN (Knowledge Performance Now, point of care tool) and chart review,patienthospitalized7/22/2021- 03/03/2020, withAcute infarct of right cerebellum, Acute CVA. Patient hospitalized on 02/13/2020 - 02/26/2020 for ICH and on 01/13/2020 - 01/15/2020 for seizure. 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     Assessment: Received Self Pay EMMI Stroke referral on 03/02/2020. Red Flag Alert  Trigger, Day #3, patient answered yes to the following question: Feeling worse overall? EMMI follow up completed and will follow up to assess further care management needs.      Plan:RNCM will call patient's daughter  for telephone outreach attempt, within 30  business days, EMMI follow up, to assess for further CM needs, and proceed with case closure, after 4th unsuccessful outreach call.       Stacy Flores H. Gardiner Barefoot, BSN, CCM [facility] Care Management [facility] Telephonic CM Phone: [phone] Fax: [phone]

## 2020-04-17 ENCOUNTER — Other Ambulatory Visit: Payer: Self-pay | Admitting: *Deleted

## 2020-04-17 NOTE — Patient Outreach (Signed)
[facility]) Care Management  04/17/2020  Stacy Flores 1955/12/20 [id number]   Subjective:Per patient's previous verbal consent.Telephone call to patient'sdaughter Stacy Flores)home number, spoke withdaughter, stated patient's name, date of birth, and address.  States patient is doing great, had her follow up appointment with primary MD on 04/10/2020, and appointment went well.   States she had given this RNCM the incorrect appointment date during previous conversation.  States primary MD is recommending patient file for disability due to cognition and memory issues related to stroke, daughter is in the process of following up on this recommendation.   States she and patient have received the EMMI educational video, have no questions. States primary MD is referring patient to a neurologist, neurologist office to call with appointment, and daughter is planning to follow up today, to verify appointment date/ time.  Daughter had questions regarding Medicaid application, is planning to follow up with hospital financial counselor, and patient's Medicaid worker, to ask questions.   She had questions regarding patient's car, $6000.00 car value, car payment,  outstanding hospital bills, and how these will affect Medicaid approval.  Daughter states she has left several messages for Medicaid worker and has not received a call back. RNCM advised daughter to call general number for Department of Social Services and ask to speak with a supervisor, daughter voices understanding, and states she will follow up.   Daughter states she has been assisting patient with payment of MD bills (has [facility] provider) but is unable to assist with outstanding hospital bills.   Daughter verbally given contact number for [facility] Patient Accounting [phone] or [phone]), states she will contact regarding outstanding bills, advise of patient's self pay status, inquire regarding patient  assistance programs, and setting up a payment plan.   Daughter states patient does not have any education material, EMMI follow up, care coordination, care management, disease monitoring, transportation, community resource, or pharmacy needs at this time.  States she is very appreciative of the follow up and is aware 30 day EMMI follow up calls are completed.   RNCM advised will discuss patient's case with [facility] Care Management Social Worker, will brainstorming regarding Medicaid application process and will advise daughter if any other assistance is available.  Left voicemail message for Stacy Flores Social Worker at [facility] Management and requested call back. Telephone to Stacy Flores Social Worker at [facility] Care Management, advised of above conversation, and states to advise daughter to verify that outstanding hospitals are being included with Medicaid application.  Per patient's previous verbal consent.Telephone call to patient'sdaughter Stacy Flores)home number, spoke withdaughter, stated patient's name, date of birth, and address.   RNCM advised daughter of above conversation with [facility] Care Management Social Worker.   Daughter states she is not sure if hospital bills were included in the [facility] application submitted by hospital financial counselor and will follow to verify.      Objective:Per KPN (Knowledge Performance Now, point of care tool) and chart review,patienthospitalized7/22/2021- 03/03/2020, withAcute infarct of right cerebellum, Acute CVA. Patient hospitalized on 02/13/2020 - 02/26/2020 for ICH and on 01/13/2020 - 01/15/2020 for seizure. 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     Assessment: Received Self Pay EMMI Stroke referral on  03/02/2020. Red Flag Alert Trigger, Day #3, patient answered yes to the following question: Feeling worse overall?EMMI  follow up completed and no further care management needs.      Plan:RNCM will complete case closure due to follow up completed / no care management needs.       Stacy Flores, BSN, CCM [facility] Care Management [facility] Telephonic CM Phone: [phone] Fax: [phone]

## 2020-05-16 ENCOUNTER — Inpatient Hospital Stay (HOSPITAL_BASED_OUTPATIENT_CLINIC_OR_DEPARTMENT_OTHER)
Admission: EM | Admit: 2020-05-16 | Discharge: 2020-05-18 | DRG: 065 | Disposition: A | Discharge status: Home / Self Care | Payer: Self-pay | Attending: Internal Medicine | Admitting: Internal Medicine

## 2020-05-16 ENCOUNTER — Encounter (HOSPITAL_COMMUNITY): Payer: Self-pay | Admitting: Family Medicine

## 2020-05-16 ENCOUNTER — Observation Stay (HOSPITAL_COMMUNITY): Payer: Self-pay

## 2020-05-16 ENCOUNTER — Other Ambulatory Visit: Payer: Self-pay

## 2020-05-16 ENCOUNTER — Emergency Department (HOSPITAL_BASED_OUTPATIENT_CLINIC_OR_DEPARTMENT_OTHER): Payer: Self-pay

## 2020-05-16 DIAGNOSIS — G40909 Epilepsy, unspecified, not intractable, without status epilepticus: Secondary | ICD-10-CM | POA: Diagnosis present

## 2020-05-16 DIAGNOSIS — E785 Hyperlipidemia, unspecified: Secondary | ICD-10-CM | POA: Diagnosis present

## 2020-05-16 DIAGNOSIS — Z90710 Acquired absence of both cervix and uterus: Secondary | ICD-10-CM

## 2020-05-16 DIAGNOSIS — R29810 Facial weakness: Secondary | ICD-10-CM | POA: Diagnosis present

## 2020-05-16 DIAGNOSIS — Z20822 Contact with and (suspected) exposure to covid-19: Secondary | ICD-10-CM | POA: Diagnosis present

## 2020-05-16 DIAGNOSIS — I629 Nontraumatic intracranial hemorrhage, unspecified: Principal | ICD-10-CM | POA: Diagnosis present

## 2020-05-16 DIAGNOSIS — H538 Other visual disturbances: Secondary | ICD-10-CM | POA: Diagnosis present

## 2020-05-16 DIAGNOSIS — I619 Nontraumatic intracerebral hemorrhage, unspecified: Secondary | ICD-10-CM | POA: Diagnosis present

## 2020-05-16 DIAGNOSIS — Z87891 Personal history of nicotine dependence: Secondary | ICD-10-CM

## 2020-05-16 DIAGNOSIS — E854 Organ-limited amyloidosis: Secondary | ICD-10-CM | POA: Diagnosis present

## 2020-05-16 DIAGNOSIS — I69198 Other sequelae of nontraumatic intracerebral hemorrhage: Secondary | ICD-10-CM

## 2020-05-16 DIAGNOSIS — Z79899 Other long term (current) drug therapy: Secondary | ICD-10-CM

## 2020-05-16 DIAGNOSIS — I10 Essential (primary) hypertension: Secondary | ICD-10-CM | POA: Diagnosis present

## 2020-05-16 DIAGNOSIS — Z886 Allergy status to analgesic agent status: Secondary | ICD-10-CM

## 2020-05-16 DIAGNOSIS — G8324 Monoplegia of upper limb affecting left nondominant side: Secondary | ICD-10-CM | POA: Diagnosis present

## 2020-05-16 DIAGNOSIS — R29704 NIHSS score 4: Secondary | ICD-10-CM | POA: Diagnosis present

## 2020-05-16 DIAGNOSIS — Z7982 Long term (current) use of aspirin: Secondary | ICD-10-CM

## 2020-05-16 DIAGNOSIS — I680 Cerebral amyloid angiopathy: Secondary | ICD-10-CM | POA: Diagnosis present

## 2020-05-16 LAB — DIFFERENTIAL
Abs Immature Granulocytes: 0.03 10*3/uL (ref 0.00–0.07)
Basophils Absolute: 0 10*3/uL (ref 0.0–0.1)
Basophils Relative: 0 %
Eosinophils Absolute: 0.1 10*3/uL (ref 0.0–0.5)
Eosinophils Relative: 1 %
Immature Granulocytes: 1 %
Lymphocytes Relative: 23 %
Lymphs Abs: 1.5 10*3/uL (ref 0.7–4.0)
Monocytes Absolute: 0.4 10*3/uL (ref 0.1–1.0)
Monocytes Relative: 7 %
Neutro Abs: 4.5 10*3/uL (ref 1.7–7.7)
Neutrophils Relative %: 68 %

## 2020-05-16 LAB — URINALYSIS, ROUTINE W REFLEX MICROSCOPIC
Bilirubin Urine: NEGATIVE
Glucose, UA: NEGATIVE mg/dL
Hgb urine dipstick: NEGATIVE
Ketones, ur: NEGATIVE mg/dL
Nitrite: NEGATIVE
Protein, ur: NEGATIVE mg/dL
Specific Gravity, Urine: 1.01 (ref 1.005–1.030)
pH: 6 (ref 5.0–8.0)

## 2020-05-16 LAB — COMPREHENSIVE METABOLIC PANEL
ALT: 27 U/L (ref 0–44)
AST: 21 U/L (ref 15–41)
Albumin: 4.4 g/dL (ref 3.5–5.0)
Alkaline Phosphatase: 69 U/L (ref 38–126)
Anion gap: 12 (ref 5–15)
BUN: 9 mg/dL (ref 8–23)
CO2: 25 mmol/L (ref 22–32)
Calcium: 9.4 mg/dL (ref 8.9–10.3)
Chloride: 100 mmol/L (ref 98–111)
Creatinine, Ser: 0.59 mg/dL (ref 0.44–1.00)
GFR calc non Af Amer: >60 mL/min (ref >60)
Glucose, Bld: 119 mg/dL — ABNORMAL HIGH (ref 70–99)
Potassium: 3.6 mmol/L (ref 3.5–5.1)
Sodium: 137 mmol/L (ref 135–145)
Total Bilirubin: 0.3 mg/dL (ref 0.3–1.2)
Total Protein: 7.9 g/dL (ref 6.5–8.1)

## 2020-05-16 LAB — CBC
HCT: 40.9 % (ref 36.0–46.0)
Hemoglobin: 13.2 g/dL (ref 12.0–15.0)
MCH: 27.8 pg (ref 26.0–34.0)
MCHC: 32.3 g/dL (ref 30.0–36.0)
MCV: 86.1 fL (ref 80.0–100.0)
Platelets: 265 10*3/uL (ref 150–400)
RBC: 4.75 MIL/uL (ref 3.87–5.11)
RDW: 13.2 % (ref 11.5–15.5)
WBC: 6.5 10*3/uL (ref 4.0–10.5)
nRBC: 0 % (ref 0.0–0.2)

## 2020-05-16 LAB — RAPID URINE DRUG SCREEN, HOSP PERFORMED
Amphetamines: NOT DETECTED
Barbiturates: NOT DETECTED
Benzodiazepines: NOT DETECTED
Cocaine: NOT DETECTED
Opiates: NOT DETECTED
Tetrahydrocannabinol: NOT DETECTED

## 2020-05-16 LAB — URINALYSIS, MICROSCOPIC (REFLEX)

## 2020-05-16 LAB — ETHANOL: Alcohol, Ethyl (B): <10 mg/dL (ref <10)

## 2020-05-16 LAB — RESPIRATORY PANEL BY RT PCR (FLU A&B, COVID)
Influenza A by PCR: NEGATIVE
Influenza B by PCR: NEGATIVE
SARS Coronavirus 2 by RT PCR: NEGATIVE

## 2020-05-16 LAB — PROTIME-INR
INR: 1 (ref 0.8–1.2)
Prothrombin Time: 12.7 seconds (ref 11.4–15.2)

## 2020-05-16 LAB — APTT: aPTT: 29 seconds (ref 24–36)

## 2020-05-16 LAB — CBG MONITORING, ED: Glucose-Capillary: 106 mg/dL — ABNORMAL HIGH (ref 70–99)

## 2020-05-16 MED ORDER — STROKE: EARLY STAGES OF RECOVERY BOOK
Freq: Once | Status: AC
  Filled 2020-05-16: qty 1

## 2020-05-16 MED ORDER — ACETAMINOPHEN 160 MG/5ML PO SOLN: 650.0000 mg | ORAL | Status: DC | PRN

## 2020-05-16 MED ORDER — HYDRALAZINE HCL 20 MG/ML IJ SOLN
10.0000 mg | Freq: Once | INTRAMUSCULAR | Status: DC
  Filled 2020-05-16: qty 1

## 2020-05-16 MED ORDER — ACETAMINOPHEN 325 MG PO TABS: 650.0000 mg | ORAL_TABLET | ORAL | Status: DC | PRN

## 2020-05-16 MED ORDER — SODIUM CHLORIDE 0.9 % IV SOLN: INTRAVENOUS | Status: DC

## 2020-05-16 MED ORDER — LABETALOL HCL 5 MG/ML IV SOLN: 5.0000 mg | INTRAVENOUS | Status: DC | PRN

## 2020-05-16 MED ORDER — LABETALOL HCL 5 MG/ML IV SOLN
5.0000 mg | Freq: Once | INTRAVENOUS | Status: AC
  Administered 2020-05-16: 5 mg via INTRAVENOUS
  Filled 2020-05-16: qty 4

## 2020-05-16 MED ORDER — ACETAMINOPHEN 650 MG RE SUPP: 650.0000 mg | RECTAL | Status: DC | PRN

## 2020-05-16 MED ORDER — LABETALOL HCL 5 MG/ML IV SOLN
10.0000 mg | Freq: Once | INTRAVENOUS | Status: DC
  Filled 2020-05-16: qty 4

## 2020-05-16 MED ORDER — LABETALOL HCL 5 MG/ML IV SOLN
5.0000 mg | INTRAVENOUS | Status: DC | PRN
  Administered 2020-05-16: 5 mg via INTRAVENOUS
  Filled 2020-05-16: qty 4

## 2020-05-16 MED ORDER — SENNOSIDES-DOCUSATE SODIUM 8.6-50 MG PO TABS: 1.0000 | ORAL_TABLET | Freq: Every evening | ORAL | Status: DC | PRN

## 2020-05-16 MED ORDER — LEVETIRACETAM 750 MG PO TABS
750.0000 mg | ORAL_TABLET | Freq: Two times a day (BID) | ORAL | Status: DC
  Administered 2020-05-16 – 2020-05-18 (×4): 750 mg via ORAL
  Filled 2020-05-16 (×4): qty 1

## 2020-05-16 NOTE — ED Notes (Signed)
Called.

## 2020-05-16 NOTE — ED Triage Notes (Signed)
Per family, patient started to have left hand numbness and left arm muscles spasms at 4 pm.  Pt has history of CVA with similar presentation in July.  Pt awake, unable to lift left arm well, which is new.

## 2020-05-16 NOTE — Progress Notes (Signed)
Pt admitted from [facility], alert and oriented, denies any pain at this time, settled in bed with call light at bedside, tele monitor put and verified on pt, admitting doc and neurologist also notified of pt's arrival, safety measures explained and initiated, pt was however reassured and will continue to monitor, v/s stable. Stacy Flores, Stacy Flores

## 2020-05-16 NOTE — H&P (Signed)
History and Physical    Stacy Flores ZOX:[id number] DOB: [birth year] DOA: 05/16/2020  PCP: Brayton Elrosinis, Donna J, PA-C   Patient coming from: Home   Chief Complaint: Left arm numbness and weakness, left facial droop   HPI: Stacy Flores is a 64 y.o. female with medical history significant for cerebral amyloid angiopathy, history of intracranial hemorrhage, hypertension, and seizure disorder, now presenting to the emergency department for evaluation of left arm numbness and weakness and left facial droop.  Patient reports development of left hand numbness and tingling and decreased left grip strength that began around 4 PM.  Family noted that she also had a left facial droop and change in speech.  Patient states that she had a mild headache associated with this but that has since resolved.  She reports some minimal residual left grip deficit that had persisted since the ICH a few months ago.  She also had some left arm twitching this evening that has since resolved.  She denies any fevers, chills, chest pain, palpitations, cough, or shortness of breath.  [facility] ED Course: Upon arrival to the ED, patient is found to be afebrile, saturating well on room air, and with stable blood pressure.  EKG features sinus rhythm.  Chemistry panel and CBC are unremarkable.  Covid PCR is negative.  Noncontrast head CT is concerning for 4 mm focus of hyperdensity in the right posterior parietal region consistent with recent hemorrhage, and also notable for 2 mm focus in the left parietal region.  Neurosurgery was contacted by the ED physician and did not anticipate any need for surgical intervention.  Neurology was also consulted and recommended admission for neuro checks, blood pressure control, and repeat head CT.  Review of Systems:  All other systems reviewed and apart from HPI, are negative.  Past Medical History:  Diagnosis Date  . Hypertension   . Stroke [facility](HCC)     Past Surgical  History:  Procedure Laterality Date  . ABDOMINAL HYSTERECTOMY    . TONSILLECTOMY      Social History:   reports that she has quit smoking. She has never used smokeless tobacco. She reports previous alcohol use. She reports that she does not use drugs.  Allergies  Allergen Reactions  . Aspirin Other (See Comments)    High risk of intracerebral hemorrhage due to cerebral amyloid angiopathy, please avoid all antiplatelets    History reviewed. No pertinent family history.   Prior to Admission medications   Medication Sig Start Date End Date Taking? Authorizing Provider  acetaminophen (TYLENOL) 500 MG tablet Take 500 mg by mouth every 6 (six) hours as needed for headache.    [provider]  amLODipine (NORVASC) 10 MG tablet Take 1 tablet (10 mg total) by mouth daily. Patient not taking: Reported on 03/12/2020 02/27/20   Rinehuls, Kinnie ScalesDavid L, PA-C  diphenhydrAMINE (BENADRYL) 25 mg capsule Take 25 mg by mouth every 6 (six) hours as needed. Patient not taking: Reported on 03/12/2020    [provider]  levETIRAcetam (KEPPRA) 750 MG tablet Take 1 tablet (750 mg total) by mouth 2 (two) times daily. 02/26/20   Reeves ForthRinehuls, David L, PA-C    Physical Exam: Vitals:   05/16/20 2030 05/16/20 2100 05/16/20 2145 05/16/20 2300  BP: (!) 144/84 139/68 (!) 149/86 140/72  Pulse: 81 76 80   Resp: 20 16 18    Temp:   97.9 F (36.6 C) 98.1 F (36.7 C)  TempSrc:   Oral Oral  SpO2: 100% 98%  100%     Constitutional: NAD, calm  Eyes: PERTLA, lids and conjunctivae normal ENMT: Mucous membranes are moist. Posterior pharynx clear of any exudate or lesions.   Neck: normal, supple, no masses, no thyromegaly Respiratory: no wheezing, no crackles. No accessory muscle use.  Cardiovascular: S1 & S2 heard, regular rate and rhythm. No extremity edema.   Abdomen: No distension, no tenderness, soft. Bowel sounds active.  Musculoskeletal: no clubbing / cyanosis. No joint deformity upper and lower  extremities.   Skin: no significant rashes, lesions, ulcers. Warm, dry, well-perfused. Neurologic: PERRL, EOMI. No dysarthria or aphasia. Subtle left lower facial weakness. Sensation to light touch diminished in distal LUE and LLE. Left grip strength 4/5, strength is otherwise 5/5 in all 4 limbs.  Psychiatric: Alert and oriented to person, place, and situation. Pleasant and cooperative.    Labs and Imaging on Admission: I have personally reviewed following labs and imaging studies  CBC: Recent Labs  Lab 05/16/20 1857  WBC 6.5  NEUTROABS 4.5  HGB 13.2  HCT 40.9  MCV 86.1  PLT 265   Basic Metabolic Panel: Recent Labs  Lab 05/16/20 2012  NA 137  K 3.6  CL 100  CO2 25  GLUCOSE 119*  BUN 9  CREATININE 0.59  CALCIUM 9.4   GFR: CrCl cannot be calculated (Unknown ideal weight.). Liver Function Tests: Recent Labs  Lab 05/16/20 2012  AST 21  ALT 27  ALKPHOS 69  BILITOT 0.3  PROT 7.9  ALBUMIN 4.4   No results for input(s): LIPASE, AMYLASE in the last 168 hours. No results for input(s): AMMONIA in the last 168 hours. Coagulation Profile: Recent Labs  Lab 05/16/20 2012  INR 1.0   Cardiac Enzymes: No results for input(s): CKTOTAL, CKMB, CKMBINDEX, TROPONINI in the last 168 hours. BNP (last 3 results) No results for input(s): PROBNP in the last 8760 hours. HbA1C: No results for input(s): HGBA1C in the last 72 hours. CBG: Recent Labs  Lab 05/16/20 1834  GLUCAP 106*   Lipid Profile: No results for input(s): CHOL, HDL, LDLCALC, TRIG, CHOLHDL, LDLDIRECT in the last 72 hours. Thyroid Function Tests: No results for input(s): TSH, T4TOTAL, FREET4, T3FREE, THYROIDAB in the last 72 hours. Anemia Panel: No results for input(s): VITAMINB12, FOLATE, FERRITIN, TIBC, IRON, RETICCTPCT in the last 72 hours. Urine analysis:    Component Value Date/Time   COLORURINE STRAW (A) 05/16/2020 1857   APPEARANCEUR CLEAR 05/16/2020 1857   LABSPEC 1.010 05/16/2020 1857   PHURINE 6.0  05/16/2020 1857   GLUCOSEU NEGATIVE 05/16/2020 1857   HGBUR NEGATIVE 05/16/2020 1857   BILIRUBINUR NEGATIVE 05/16/2020 1857   KETONESUR NEGATIVE 05/16/2020 1857   PROTEINUR NEGATIVE 05/16/2020 1857   NITRITE NEGATIVE 05/16/2020 1857   LEUKOCYTESUR SMALL (A) 05/16/2020 1857   Sepsis Labs: @LABRCNTIP (procalcitonin:4,lacticidven:4) ) Recent Results (from the past 240 hour(s))  Respiratory Panel by RT PCR (Flu A&B, Covid) - Urine, Clean Catch     Status: None   Collection Time: 05/16/20  6:58 PM   Specimen: Urine, Clean Catch; Nasopharyngeal  Result Value Ref Range Status   SARS Coronavirus 2 by RT PCR NEGATIVE NEGATIVE Final    Comment: (NOTE) SARS-CoV-2 target nucleic acids are NOT DETECTED.  The SARS-CoV-2 RNA is generally detectable in upper respiratoy specimens during the acute phase of infection. The lowest concentration of SARS-CoV-2 viral copies this assay can detect is 131 copies/mL. A negative result does not preclude SARS-Cov-2 infection and should not be used as the sole basis for treatment or  other patient management decisions. A negative result may occur with  improper specimen collection/handling, submission of specimen other than nasopharyngeal swab, presence of viral mutation(s) within the areas targeted by this assay, and inadequate number of viral copies (<131 copies/mL). A negative result must be combined with clinical observations, patient history, and epidemiological information. The expected result is Negative.  Fact Sheet for Patients:  https://www.moore.com/  Fact Sheet for Healthcare Providers:  https://www.young.biz/  This test is no t yet approved or cleared by the [place] FDA and  has been authorized for detection and/or diagnosis of SARS-CoV-2 by FDA under an Emergency Use Authorization (EUA). This EUA will remain  in effect (meaning this test can be used) for the duration of the COVID-19 declaration  under Section 564(b)(1) of the Act, 21 U.S.C. section 360bbb-3(b)(1), unless the authorization is terminated or revoked sooner.     Influenza A by PCR NEGATIVE NEGATIVE Final   Influenza B by PCR NEGATIVE NEGATIVE Final    Comment: (NOTE) The Xpert Xpress SARS-CoV-2/FLU/RSV assay is intended as an aid in  the diagnosis of influenza from Nasopharyngeal swab specimens and  should not be used as a sole basis for treatment. Nasal washings and  aspirates are unacceptable for Xpert Xpress SARS-CoV-2/FLU/RSV  testing.  Fact Sheet for Patients: https://www.moore.com/  Fact Sheet for Healthcare Providers: https://www.young.biz/  This test is not yet approved or cleared by the [place] FDA and  has been authorized for detection and/or diagnosis of SARS-CoV-2 by  FDA under an Emergency Use Authorization (EUA). This EUA will remain  in effect (meaning this test can be used) for the duration of the  Covid-19 declaration under Section 564(b)(1) of the Act, 21  U.S.C. section 360bbb-3(b)(1), unless the authorization is  terminated or revoked. Performed at [facility], [address] [place], [place] [address]      Radiological Exams on Admission: CT HEAD CODE STROKE WO CONTRAST  Result Date: 05/16/2020 CLINICAL DATA:  Code stroke. Left hand numbness beginning today. History of amyloid angiopathy with multiple hemorrhages. EXAM: CT HEAD WITHOUT CONTRAST TECHNIQUE: Contiguous axial images were obtained from the base of the skull through the vertex without intravenous contrast. COMPARISON:  CT and MRI 03/01/2020. FINDINGS: Brain: No focal abnormality seen affecting the brainstem or cerebellum. Resolution of the majority of the intraparenchymal hemorrhage in the right posterior parietal region that was seen in July of this year. Encephalomalacia and gliosis in that region. 4 mm focus of hyperdensity in the nearby right posterior parietal brain  could represent an additional recent hemorrhage, in this patient known to have had multiple previous hemorrhages in that region. Additional 2 mm punctate focus in the left parietal region could represent a tiny acute parenchymal hemorrhage. No intraventricular, subarachnoid or subdural blood. No hydrocephalus. Extra-axial collection. Vascular: There is atherosclerotic calcification of the major vessels at the base of the brain. Skull: Negative Sinuses/Orbits: Clear Other: None IMPRESSION: 1. Resolution of the majority of the intraparenchymal hemorrhage in the right posterior parietal region that was seen in July of this year. 2. 4 mm focus of hyperdensity in the nearby right posterior parietal brain could represent an additional recent hemorrhage, in this patient known to have had multiple previous hemorrhages in that region. Additional 2 mm focus in the left parietal region could represent a tiny acute parenchymal hemorrhage. 3. These results were called by telephone at the time of interpretation on 05/16/2020 at 6:56 pm to provider Geoffery Lyons , who verbally acknowledged these  results. Electronically Signed   By: Paulina Fusi M.D.   On: 05/16/2020 18:59    EKG: Independently reviewed. Sinus rhythm.   Assessment/Plan   1. Intracranial hemorrhage; cerebral amyloid angiopathy  - Patient with CAA and hx of ICH presents with left facial droop and left arm numbness and weakness and is found to have new hyperdensities on head CT suspicious for recurrent ICH  - Neurology consulting and much appreciated  - Repeat head CT, continue neuro checks, BP-control, and supportive care    2. Hypertension  - Pharmacy medication-reconciliation pending, using prn labetalol for now   3. Seizures  - Keppra started in June 2021 when she presented with suspected focal seizure involving LUE likely related to ICH  - Continue Keppra     DVT prophylaxis: SCDs Code Status: Full  Family Communication: Discussed with patient   Disposition Plan:  Patient is from: Home  Anticipated d/c is to: TBD Anticipated d/c date is: 05/17/20 Patient currently: Pending repeat CT head, neuro checks  Consults called: Neurology  Admission status: Observation     Briscoe Deutscher, MD [facility]  05/16/2020, 11:44 PM

## 2020-05-16 NOTE — Consult Note (Addendum)
[facility] Telemedicine Consult  Requesting Provider: Dr. Eyvonne Mechanic   Chief Complaint: L sided tremor, numbness, weakness   History is obtained from patient, chart review and daughter at bedside  HPI: This is a 64 year old woman with a past medical history significant for cerebral amyloid angiopathy, who was working up until having a right parietal intracerebral hemorrhage in December 2020.  She has some residual right-sided visual field deficit from that stroke, but her weakness resolved.  She was last seen well at 4 PM today.  She developed some left hand numbness and then was noted to have some tremulousness in that hand followed by weakness.  This weakness and numbness is resolved by the time of my evaluation per the patient's report.  Her blood pressure on admission was 167/85, blood sugar was in a fairly normal range.  She and her daughter deny any other recent symptoms of illness other than some mild constipation on Thursday.  Specifically they deny fevers, chills, sweats, cough, cold, burning with urination, diarrhea.  She has had some headaches but this is not unusual for her.  She has not noticed any worsening of her baseline visual field deficit or double vision.  Prior to today she was not having any focal weakness or numbness anywhere else.  LKW:16:00  tpa given?: No, due to hemorrhage and history of CAA  Modified Rankin Scale: 2-Slight disability-UNABLE to perform all activities but does not need assistance to walk ICH Score 0   Exam: Vitals:   05/16/20 1918 05/16/20 1919  BP:    Pulse:  95  Resp:  16  Temp:    SpO2: 98% 97%    General:  Alert, comfortable, no acute distress  1A: Level of Consciousness - 0 1B: Ask Month and Age - 0 1C: 'Blink Eyes' & 'Squeeze Hands' - 0 2: Test Horizontal Extraocular Movements - 0 3: Test Visual Fields - 2 (baseline) 4: Test Facial Palsy - 0 5A: Test Left Arm Motor Drift - 0 5B: Test Right Arm Motor Drift - 0 6A: Test  Left Leg Motor Drift - 0 6B: Test Right Leg Motor Drift - 0 7: Test Limb Ataxia - 0 8: Test Sensation - 0 9: Test Language/Aphasia- 1 some slowness to her speech and naming, with some confusion at times 10: Test Dysarthria - 1 11: Test Extinction/Inattention - 1 NIHSS score: 4 -- though I suspect the inattention/extinction is also chronic from her prior lesion  Imaging Reviewed:  Dry head CT with 2 small parietal hemorrhages  Labs reviewed in epic and pertinent values follow: U tox negative, PT/INR in process, CMP in process CBC unremarkable with a normal platelet count  Assessment: This is a 64 year old woman with a known history of cerebral amyloid angiopathy presenting with further cortical microhemorrhages.  Rapid resolution of her symptoms suggests some mild cortical irritation from the blood that is already resolving.  However given that he has been on an antiplatelet agent (aspirin), she merits close monitoring.   Recommendations:   #Multifocal small cortical hemorrhages in the setting of known cerebral amyloid angiopathy -Stepdown level admission for close monitoring  -Please stop aspirin; I have listed this as an allergy given the importance of avoiding antiplatelet agents in the setting of this disease process -Systolic blood pressure goal less than 140, please use labetalol as needed; patient responded well to 5 mg but this may be increased to 10 mg if needed -Every 2 hour neurochecks -Stability head CT in 6 hours (approximately 11:30 PM)  #  Goals of care  I briefly discussed the progressive nature of cerebral amyloid angiopathy with high likelihood of recurrent bleeding and high likelihood of eventual catastrophic intracerebral hemorrhage.  Family requested additional time to discuss patient's wishes with the patient and amongst themselves -Palliative care consult in the morning to help guide care planning while patient is still able to give significant input about her  wishes  Consult Participants: ED nurse, [facility] nurse, patient, daughter Location of the provider: [facility]  Location of the patient: [facility]  This consult was provided via telemedicine with 2-way video and audio communication. The patient/family was informed that care would be provided in this way and agreed to receive care in this manner.   This patient is receiving care for possible acute neurological changes. There was 40 minutes of care by this provider at the time of service, including time for direct evaluation via telemedicine, review of medical records, imaging studies and discussion of findings with providers, the patient and/or family.  Brooke Dare MD-PhD [facility] [phone]   If 7pm- 7am, please page neurology on call as listed in AMION.

## 2020-05-16 NOTE — Plan of Care (Signed)
Seen few hours ago by Dr. Iver Nestle via tele. Admitted to neuroprogressive unit. Will follow 6h stability scan when done. Stroke team will continue to follow.  -- Milon Dikes, MD [facility] Pager: [phone] If 7pm to 7am, please call on call as listed on AMION.

## 2020-05-16 NOTE — ED Provider Notes (Signed)
[facility] EMERGENCY DEPARTMENT Provider Note   CSN: [id number] Arrival date & time: 05/16/20  1813     History Chief Complaint  Patient presents with  . Numbness  . Code Stroke    Stacy Flores is a 64 y.o. female.  Patient is a 64 year old female with history of intracranial hemorrhage in December 2020 with recurrence of the same in July 2021.  Patient presents today with complaints of headache, left arm weakness/numbness, and twitching of her left arm.  This started today acutely at approximately 4 PM.  She was normal before this and was accompanied by a family member when symptoms began.  Patient somewhat slow to offer history and majority of history taken from daughter who was present at bedside.  Patient does describe some facial numbness and daughter believes she has a left facial droop.  Patient denies any injury or trauma.  She denies any visual disturbances.  The history is provided by the patient.       Past Medical History:  Diagnosis Date  . Hypertension   . Stroke [facility])     Patient Active Problem List   Diagnosis Date Noted  . Acute cerebrovascular accident (CVA) (HCC) 03/01/2020  . Seizure disorder (HCC) 03/01/2020  . Muscle twitching 01/14/2020  . Weakness of left upper extremity 01/13/2020  . Cerebral amyloid angiopathy (CODE) 07/25/2019  . Essential hypertension 07/25/2019  . Hyperlipidemia 07/25/2019  . 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 ICH (intracerebral hemorrhage) (HCC) 07/21/2019    Past Surgical History:  Procedure Laterality Date  . ABDOMINAL HYSTERECTOMY    . TONSILLECTOMY       OB History   No obstetric history on file.     No family history on file.  Social History   Tobacco Use  . Smoking status: Former Games developer  . Smokeless tobacco: Never Used  Vaping Use  . Vaping Use: Never used  Substance Use Topics  . Alcohol use: Not Currently  . Drug use: Never    Home  Medications Prior to Admission medications   Medication Sig Start Date End Date Taking? Authorizing Provider  acetaminophen (TYLENOL) 500 MG tablet Take 500 mg by mouth every 6 (six) hours as needed for headache.    [provider]  amLODipine (NORVASC) 10 MG tablet Take 1 tablet (10 mg total) by mouth daily. Patient not taking: Reported on 03/12/2020 02/27/20   Rinehuls, Wyline Mood  aspirin EC 81 MG EC tablet Take 1 tablet (81 mg total) by mouth daily. Swallow whole. 03/04/20   Briant Cedar, MD  diphenhydrAMINE (BENADRYL) 25 mg capsule Take 25 mg by mouth every 6 (six) hours as needed. Patient not taking: Reported on 03/12/2020    [provider]  levETIRAcetam (KEPPRA) 750 MG tablet Take 1 tablet (750 mg total) by mouth 2 (two) times daily. 02/26/20   Rinehuls, Kinnie Scales, PA-C    Allergies    Patient has no known allergies.  Review of Systems   Review of Systems  All other systems reviewed and are negative.   Physical Exam Updated Vital Signs BP (!) 151/84   Pulse 80   Temp (!) 97.5 F (36.4 C) (Oral)   Resp 16   SpO2 100%   Physical Exam Vitals and nursing note reviewed.  Constitutional:      General: She is not in acute distress.    Appearance: She is well-developed. She is not diaphoretic.  HENT:     Head:  Normocephalic and atraumatic.  Eyes:     Extraocular Movements: Extraocular movements intact.     Pupils: Pupils are equal, round, and reactive to light.  Cardiovascular:     Rate and Rhythm: Normal rate and regular rhythm.     Heart sounds: No murmur heard.  No friction rub. No gallop.   Pulmonary:     Effort: Pulmonary effort is normal. No respiratory distress.     Breath sounds: Normal breath sounds. No wheezing.  Abdominal:     General: Bowel sounds are normal. There is no distension.     Palpations: Abdomen is soft.     Tenderness: There is no abdominal tenderness.  Musculoskeletal:        General: Normal range of motion.      Cervical back: Normal range of motion and neck supple.  Skin:    General: Skin is warm and dry.  Neurological:     Mental Status: She is alert and oriented to person, place, and time.     Comments: Patient is awake and alert, but is somewhat slow to respond to questions and commands.  He does have what appears to be a slight facial droop on the left and hand grip is somewhat diminished on the left.  Plantar flexion and extension of her ankle is diminished somewhat on the left as well.     ED Results / Procedures / Treatments   Labs (all labs ordered are listed, but only abnormal results are displayed) Labs Reviewed  CBG MONITORING, ED - Abnormal; Notable for the following components:      Result Value   Glucose-Capillary 106 (*)    All other components within normal limits  ETHANOL  PROTIME-INR  APTT  CBC  DIFFERENTIAL  COMPREHENSIVE METABOLIC PANEL  RAPID URINE DRUG SCREEN, HOSP PERFORMED  URINALYSIS, ROUTINE W REFLEX MICROSCOPIC    EKG EKG Interpretation  Date/Time:  Wednesday May 16 2020 18:21:41 EDT Ventricular Rate:  87 PR Interval:  142 QRS Duration: 78 QT Interval:  372 QTC Calculation: 447 R Axis:   24 Text Interpretation: Normal sinus rhythm Nonspecific ST and T wave abnormality Abnormal ECG No significant change since 03/01/2020 Confirmed by Geoffery Lyons ([id number]) on 05/16/2020 6:29:46 PM   Radiology No results found.  Procedures Procedures (including critical care time)  Medications Ordered in ED Medications - No data to display  ED Course  I have reviewed the triage vital signs and the nursing notes.  Pertinent labs & imaging results that were available during my care of the patient were reviewed by me and considered in my medical decision making (see chart for details).    MDM Rules/Calculators/A&P  Patient with history of cerebral amyloid angiopathy and recurrent intraparenchymal hemorrhage.  She presents today after the sudden onset of headache  and left arm and leg weakness along with facial droop.  The symptoms began at 4 PM.  Patient arrives here in a timely fashion and a code stroke was initiated.  Patient went for emergent head CT which revealed 2 areas of intraparenchymal hemorrhage.  Patient seen by teleneurology and care discussed with both Dr. Iver Nestle and Dr. Wilford Corner.   She is to be admitted to the hospitalist service at [facility] where she will need repeat head CT and frequent neuro checks.  Remainder of her work-up is otherwise unremarkable.  CRITICAL CARE Performed by: Geoffery Lyons Total critical care time: 70 minutes Critical care time was exclusive of separately billable procedures and treating other patients. Critical care  was necessary to treat or prevent imminent or life-threatening deterioration. Critical care was time spent personally by me on the following activities: development of treatment plan with patient and/or surrogate as well as nursing, discussions with consultants, evaluation of patient's response to treatment, examination of patient, obtaining history from patient or surrogate, ordering and performing treatments and interventions, ordering and review of laboratory studies, ordering and review of radiographic studies, pulse oximetry and re-evaluation of patient's condition.   Final Clinical Impression(s) / ED Diagnoses Final diagnoses:  None    Rx / DC Orders ED Discharge Orders    None       Geoffery Lyons, MD 05/16/20 2334

## 2020-05-17 DIAGNOSIS — I680 Cerebral amyloid angiopathy: Secondary | ICD-10-CM

## 2020-05-17 DIAGNOSIS — I619 Nontraumatic intracerebral hemorrhage, unspecified: Secondary | ICD-10-CM | POA: Diagnosis present

## 2020-05-17 DIAGNOSIS — G40909 Epilepsy, unspecified, not intractable, without status epilepticus: Secondary | ICD-10-CM

## 2020-05-17 DIAGNOSIS — Z8673 Personal history of transient ischemic attack (TIA), and cerebral infarction without residual deficits: Secondary | ICD-10-CM

## 2020-05-17 DIAGNOSIS — I629 Nontraumatic intracranial hemorrhage, unspecified: Principal | ICD-10-CM

## 2020-05-17 MED ORDER — AMLODIPINE BESYLATE 5 MG PO TABS
5.0000 mg | ORAL_TABLET | Freq: Every day | ORAL | Status: DC
  Administered 2020-05-17 – 2020-05-18 (×2): 5 mg via ORAL
  Filled 2020-05-17 (×2): qty 1

## 2020-05-17 NOTE — Evaluation (Signed)
Physical Therapy Evaluation Patient Details Name: Stacy Flores MRN: [id number] DOB: 1955-10-12 Today's Date: 05/17/2020   History of Present Illness  Patient is a 64 year old female who presented with L UE weakness, numbness, and spasms along with L facial droop and a change in speech. She has a history of an intracranial hemorrhage in December 2020 and recurrence in July 2021. CT displayed slight interval inc in size of previous punctate parenchymal hemorrhages involving B parieto-occipital regions with a few additional punctate hemorrhages involving R occipital region. NIH initially 6. Medical hx of previous CVAs, seizures, and HTN.  Clinical Impression  Pt displays decreased sensation to light touch and decreased strength in her L UE and L LE. She also demonstrates slow initiation and processing, but unsure if this is baseline. Pt is able to perform all functional mobility, including ascending and descending stairs with 1 HHA, with only min guard for safety. She does not display any LOB or trunk sway with mobility and thus not recommending any devices at this time. Displays slightly decreased step length on her L with gait. Pt educated on safe and proper sequencing with ascending and descending stairs. Pt and pt's daughter educated on plan for PT services and educated on [facility] and provided a handout. Pt will continue to benefit from acute PT services to address her mentioned deficits to maximize her independence and safety with all functional mobility. Recommending outpatient PT follow-up once discharged due to pt being of high function and being aware of her safety and deficits and also due to her having personnel available to assist her and drive her as needed.    Follow Up Recommendations Outpatient PT;Supervision for mobility/OOB    Equipment Recommendations  None recommended by PT    Recommendations for Other Services       Precautions / Restrictions Precautions Precautions:  Fall Precaution Comments: seizures Restrictions Weight Bearing Restrictions: No      Mobility  Bed Mobility               General bed mobility comments: Pt already sitting EOB upon arrival  Transfers Overall transfer level: Needs assistance Equipment used: None Transfers: Sit to/from Stand Sit to Stand: Min guard         General transfer comment: Pt comes to stand slowly without LOB. VC's for hand placement. Reported feeling dizzy and thus stood still for several min with BP 110/70s whereas it was 130s/70s previously. Dizziness resided once standing statically for several minutes.  Ambulation/Gait Ambulation/Gait assistance: Min guard Gait Distance (Feet): 300 Feet Assistive device: None Gait Pattern/deviations: Step-through pattern;Decreased step length - left (wide BOS; slow gait speed) Gait velocity: decr Gait velocity interpretation: <1.31 ft/sec, indicative of household ambulator General Gait Details: Ambulates with wide BOS and slightly decr step length on L, but improved symmetry with VC's. No LOB or change in gait with changes in head position. Pt ambulates at slow pace compared to reported norm.  Stairs Stairs: Yes Stairs assistance: Min guard Stair Management: No rails;Step to pattern (R HHA) Number of Stairs: 2 General stair comments: VC's to lead up with R LE and down with L, with success. No LOB noted with pt requiring R HHA and min guard for safety, displaying step-to gait pattern.  Wheelchair Mobility    Modified Rankin (Stroke Patients Only) Modified Rankin (Stroke Patients Only) Pre-Morbid Rankin Score: Moderate disability Modified Rankin: Moderately severe disability     Balance Overall balance assessment: No apparent balance deficits (not formally assessed) (slow  in movement but no trunk sway or LOB noted)                                           Pertinent Vitals/Pain Pain Assessment: No/denies pain    Home Living  Family/patient expects to be discharged to:: Private residence Living Arrangements: Non-relatives/Friends (1 friend who is available 24/7, daughters can assist also) Available Help at Discharge: Family;Friend(s);Available 24 hours/day Type of Home: House Home Access: Stairs to enter Entrance Stairs-Rails: None Entrance Stairs-Number of Steps: 1 (threshold) Home Layout: One level Home Equipment: None Additional Comments: Pt requires assistance with chores. Pt does not drive.     Prior Function Level of Independence: Independent         Comments: doesn't drive but goes with roommate for errands, otherwise fully independent     Hand Dominance   Dominant Hand: Right    Extremity/Trunk Assessment   Upper Extremity Assessment Upper Extremity Assessment: LUE deficits/detail;RUE deficits/detail RUE Deficits / Details: R UE MMT grossly 4 to 4+ RUE Sensation: WNL LUE Deficits / Details: L UE MMT grossly 3+ to 4- LUE Sensation: decreased light touch (throughout)    Lower Extremity Assessment Lower Extremity Assessment: RLE deficits/detail;LLE deficits/detail RLE Deficits / Details: R LE MMT scores of 4 to 4+ throughout RLE Sensation: WNL RLE Coordination: WNL (slow with foot tapping and rubbing contralateral LE) LLE Deficits / Details: L LE MMT scores of 3+ to 4- grossly throughout LLE Sensation: decreased light touch (at foot, but normal at knee and thigh) LLE Coordination: WNL (slow with foot tapping and rubbing contralateral LE)       Communication   Communication:  (slow initiation on occasion with speech)  Cognition Arousal/Alertness: Awake/alert Behavior During Therapy: WFL for tasks assessed/performed Overall Cognitive Status: Within Functional Limits for tasks assessed                                 General Comments: Unsure of baseline, but displayed slow processing and initiation. A&O x4. She is aware of her deficits and safety concerns.       General Comments      Exercises     Assessment/Plan    PT Assessment Patient needs continued PT services  PT Problem List Decreased strength;Decreased activity tolerance;Decreased mobility;Impaired sensation       PT Treatment Interventions Gait training;Stair training;Functional mobility training;Therapeutic activities;Therapeutic exercise;Balance training;Neuromuscular re-education;Patient/family education    PT Goals (Current goals can be found in the Care Plan section)  Acute Rehab PT Goals Patient Stated Goal: to go home PT Goal Formulation: With patient/family Time For Goal Achievement: 05/24/20 Potential to Achieve Goals: Good    Frequency Min 4X/week   Barriers to discharge        Co-evaluation               AM-PAC PT "6 Clicks" Mobility  Outcome Measure Help needed turning from your back to your side while in a flat bed without using bedrails?: None Help needed moving from lying on your back to sitting on the side of a flat bed without using bedrails?: None Help needed moving to and from a bed to a chair (including a wheelchair)?: A Little Help needed standing up from a chair using your arms (e.g., wheelchair or bedside chair)?: A Little Help needed to walk in  hospital room?: A Little Help needed climbing 3-5 steps with a railing? : A Little 6 Click Score: 20    End of Session Equipment Utilized During Treatment: Gait belt Activity Tolerance: Patient tolerated treatment well Patient left: in chair;with chair alarm set;with family/visitor present;with call bell/phone within reach (daughter present at start and end of session) Nurse Communication: Mobility status PT Visit Diagnosis: Other abnormalities of gait and mobility (R26.89);Muscle weakness (generalized) (M62.81);Other symptoms and signs involving the nervous system (R29.898);Hemiplegia and hemiparesis Hemiplegia - Right/Left: Left Hemiplegia - caused by: Other Nontraumatic intracranial hemorrhage     Time: 1022-1048 PT Time Calculation (min) (ACUTE ONLY): 26 min   Charges:   PT Evaluation $PT Eval Low Complexity: 1 Low PT Treatments $Gait Training: 8-22 mins        Raymond Gurney, PT, DPT Acute Rehabilitation Services  Pager: [phone] Office: [phone]   Jewel Baize 05/17/2020, 12:12 PM

## 2020-05-17 NOTE — Evaluation (Addendum)
Occupational Therapy Evaluation Patient Details Name: Stacy Flores MRN: [id number] DOB: [birth year] Today's Date: 05/17/2020    History of Present Illness Patient is a 64 year old female who presented with L UE weakness, numbness, and spasms along with L facial droop and a change in speech. She has a history of an intracranial hemorrhage in December 2020 and recurrence in July 2021. CT displayed slight interval inc in size of previous punctate parenchymal hemorrhages involving B parieto-occipital regions with a few additional punctate hemorrhages involving R occipital region. NIH initially 6. Medical hx of previous CVAs, seizures, and HTN.   Clinical Impression   PTA pt living with friend and had family checking in as needed for IADL management. Pt reports being independent with ADL. At time of eval, pt presents with ability to complete transfers and mobility at min guard level for safety. Upon assessment, noted large L visual field deficit with possible inattention component. Pt was not able to identify any objects in her L periphery until they crossed midline. Suspect this may had been occurring PTA as well, for pt automatically compensates by moving items into R visual field. Pt was then asked to draw a clock, and had significant difficulty problem solving this task. Pt drew a circle and drew numbers: 12, 5, 10, 15, 20 ,25 on R side of clock before stopping and becoming frustrated. Pt when then given a clock to model and still had difficulty identifying the difference between a typical clock and the one she had drawn. Pt also had deficits in awareness with cognitive assessment (states she had been trying to drive with daughter in car despite massive field cut and pt unaware of this). Also noted LUE weakness throughout arm, but most prominent in grip with [facility] testing. Given current status, recommend OP OT to continue to progress education with vision, cognition, and LUE weakness. Will continue to  follow per POC Listed below.    Follow Up Recommendations  Outpatient OT;Supervision/Assistance - 24 hour;Other (comment) (OP Neuro)    Equipment Recommendations  Tub/shower seat    Recommendations for Other Services       Precautions / Restrictions Precautions Precautions: Fall Precaution Comments: seizures Restrictions Weight Bearing Restrictions: No      Mobility Bed Mobility               General bed mobility comments: up in chair, returned to chair  Transfers Overall transfer level: Needs assistance Equipment used: None Transfers: Sit to/from Stand Sit to Stand: Min guard         General transfer comment: close guarding for safety    Balance                                           ADL either performed or assessed with clinical judgement   ADL Overall ADL's : Needs assistance/impaired Eating/Feeding: Set up;Sitting   Grooming: Set up;Sitting   Upper Body Bathing: Minimal assistance;Sitting   Lower Body Bathing: Minimal assistance;Sit to/from stand;Sitting/lateral leans   Upper Body Dressing : Minimal assistance;Sitting   Lower Body Dressing: Minimal assistance;Sit to/from stand;Sitting/lateral leans   Toilet Transfer: Min guard;Ambulation;Regular Toilet;RW   Toileting- Clothing Manipulation and Hygiene: Set up;Sitting/lateral lean;Sit to/from stand       Functional mobility during ADLs: Min guard;Rolling walker;Cueing for sequencing;Cueing for safety General ADL Comments: pt with large L visual field deficit and requiring cues to  maintain safety and to scan this portion of environment with mobility and ADL     Vision Baseline Vision/History: Wears glasses Wears Glasses: At all times Patient Visual Report: Peripheral vision impairment ("I think my L side is getting bad") Vision Assessment?: Yes Eye Alignment: Impaired (comment) (disconjuagte) Ocular Range of Motion: Restricted on the left;Impaired-to be further tested  in functional context Tracking/Visual Pursuits: Decreased smoothness of vertical tracking;Decreased smoothness of eye movement to LEFT superior field;Decreased smoothness of eye movement to LEFT inferior field;Requires cues, head turns, or add eye shifts to track;Impaired - to be further tested in functional context (not able to track on L unless given increased time, multimodal cues to turn head and how much to turn head) Visual Fields: Left visual field deficit;Impaired-to be further tested in functional context;Other (comment) (pt not seeing on L past midline) Additional Comments: Pt with large L visual field deficit. With peripheral testing, pt not able to identify any objects in L periphery until they crossed midline when looking straight ahead. Suspect possible inattention component as well?     Perception     Praxis      Pertinent Vitals/Pain Pain Assessment: No/denies pain     Hand Dominance     Extremity/Trunk Assessment Upper Extremity Assessment Upper Extremity Assessment: LUE deficits/detail LUE Deficits / Details: Noted decreased grip strength and grossly 4- strength in bicep, shoulder. LUE Sensation: decreased light touch LUE Coordination: decreased fine motor;decreased gross motor           Communication Communication Communication: Expressive difficulties   Cognition Arousal/Alertness: Awake/alert Behavior During Therapy: WFL for tasks assessed/performed Overall Cognitive Status: Impaired/Different from baseline Area of Impairment: Safety/judgement;Awareness;Problem solving                         Safety/Judgement: Decreased awareness of safety;Decreased awareness of deficits Awareness: Emergent Problem Solving: Slow processing;Requires verbal cues General Comments: noted decreased awareness of safety/deficits. Pt reports to this OT that she has been driving, despite evidence of large L visual field cut. Had pt attempt to draw a clock, she was not able  to write all the numbers in and wrote them as times (10, 20, 25, 30, etc). Requires increased cues to problem solve safely   General Comments       Exercises     Shoulder Instructions      Home Living Family/patient expects to be discharged to:: Private residence Living Arrangements: Non-relatives/Friends;Children Available Help at Discharge: Family;Friend(s);Available 24 hours/day Type of Home: House Home Access: Stairs to enter [employer] of Steps: 1 (threshold) Entrance Stairs-Rails: None Home Layout: One level     Bathroom Shower/Tub: IT trainer: Standard     Home Equipment: None          Prior Functioning/Environment Level of Independence: Independent        Comments: Pt reports being independent but gives conflicting reports. She reported to PT that she does not drive, and that she has been driving with daughter in car to OT. Requires some assist with IADLs        OT Problem List: Decreased strength;Impaired vision/perception;Decreased knowledge of use of DME or AE;Decreased coordination;Decreased knowledge of precautions;Decreased activity tolerance;Decreased cognition;Impaired UE functional use;Impaired balance (sitting and/or standing);Decreased safety awareness;Impaired sensation      OT Treatment/Interventions: Self-care/ADL training;Visual/perceptual remediation/compensation;Patient/family education;Therapeutic exercise;Neuromuscular education;Balance training;Therapeutic activities;DME and/or AE instruction;Cognitive remediation/compensation    OT Goals(Current goals can be found in the care plan section) Acute Rehab  OT Goals Patient Stated Goal: to go home OT Goal Formulation: With patient Time For Goal Achievement: 05/31/20 Potential to Achieve Goals: Good  OT Frequency: Min 2X/week   Barriers to D/C:            Co-evaluation              AM-PAC OT "6 Clicks" Daily Activity     Outcome Measure  Help from another person eating meals?: A Little Help from another person taking care of personal grooming?: A Little Help from another person toileting, which includes using toliet, bedpan, or urinal?: A Little Help from another person bathing (including washing, rinsing, drying)?: A Little Help from another person to put on and taking off regular upper body clothing?: A Little Help from another person to put on and taking off regular lower body clothing?: A Little 6 Click Score: 18   End of Session Nurse Communication: Mobility status  Activity Tolerance: Patient tolerated treatment well Patient left: in chair;with call bell/phone within reach  OT Visit Diagnosis: Unsteadiness on feet (R26.81);Other symptoms and signs involving the nervous system (R29.898);Low vision, both eyes (H54.2);Muscle weakness (generalized) (M62.81)                Time: 2202-5427 OT Time Calculation (min): 30 min Charges:  OT General Charges $OT Visit: 1 Visit OT Evaluation $OT Eval Moderate Complexity: 1 Mod OT Treatments $Neuromuscular Re-education: 8-22 mins  Dalphine Handing, MSOT, OTR/L Acute Rehabilitation Services [facility] Office Number: [phone] Pager: [phone]  Dalphine Handing 05/17/2020, 5:48 PM

## 2020-05-17 NOTE — Consult Note (Signed)
Stroke Neurology Consultation Note  Consult Requested by: [facility], seen by teleneuro at [facility] 05/16/2020 at 1950  Reason for Consult: new L hand numbness w/ tremulousness followed by weakness   Consult Date:  05/17/20  History of Present Illness:  Stacy Flores is an 64 y.o. 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 Seen by teleneurology. She did not receive IV t-PA due to hx IPH and CAA. CT w/ 2 new small IPHs, most likely related to CAA on low dose aspirin. Aspirin held - should not take aspirin in the future. 6h imaging w/ additional small IPHs not seen on intital CT. Transferred to [facility] for evaluation.   Date last known well: Date: 05/16/2020 Time last known well: Time: 16:00 tPA Given: No: IPH w/ hx CAA MRS:  2 NIHSS:  4 on arrival ICH score: 0  Past Medical History:  Diagnosis Date  . Hypertension   . Stroke [facility])      Past Surgical History:  Procedure Laterality Date  . ABDOMINAL HYSTERECTOMY    . TONSILLECTOMY      History reviewed. No pertinent family history.   Social History:  reports that she has quit smoking. She has never used smokeless tobacco. She reports previous alcohol use. She reports that she does not use drugs.  Review of Systems: A full ROS was attempted today and was able to be performed.  Systems assessed include - Constitutional, Eyes, HENT, Respiratory, Cardiovascular, Gastrointestinal, Genitourinary, Integument/breast, Hematologic/lymphatic, Musculoskeletal, Neurological, Behavioral/Psych, Endocrine, Allergic/Immunologic - with pertinent responses as per HPI.  Allergies:  Allergies  Allergen Reactions  . Aspirin Other (See Comments)    High risk of intracerebral hemorrhage due to  cerebral amyloid angiopathy, please avoid all antiplatelets     Medications: I have reviewed the patient's current medications.  Test Results: CBC:  Recent Labs  Lab 05/16/20 1857  WBC 6.5  NEUTROABS 4.5  HGB 13.2  HCT 40.9  MCV 86.1  PLT 265   Basic Metabolic Panel:  Recent Labs  Lab 05/16/20 2012  NA 137  K 3.6  CL 100  CO2 25  GLUCOSE 119*  BUN 9  CREATININE 0.59  CALCIUM 9.4   Liver Function Tests: Recent Labs  Lab 05/16/20 2012  AST 21  ALT 27  ALKPHOS 69  BILITOT 0.3  PROT 7.9  ALBUMIN 4.4   Coagulation Studies:  Recent Labs    05/16/20 2012  LABPROT 12.7  INR 1.0   CBG:  Recent Labs  Lab 05/16/20 1834  GLUCAP 106*   Urinalysis:  Recent Labs  Lab 05/16/20 1857  COLORURINE STRAW*  LABSPEC 1.010  PHURINE 6.0  GLUCOSEU NEGATIVE  HGBUR NEGATIVE  BILIRUBINUR NEGATIVE  KETONESUR NEGATIVE  PROTEINUR NEGATIVE  NITRITE NEGATIVE  LEUKOCYTESUR SMALL*   Microbiology:  Results for orders placed or performed during the hospital encounter of 05/16/20  Respiratory Panel by RT PCR (Flu A&B, Covid) - Urine, Clean Catch     Status: None   Collection Time: 05/16/20  6:58 PM   Specimen: Urine, Clean Catch; Nasopharyngeal  Result Value Ref Range Status   SARS Coronavirus 2 by RT PCR NEGATIVE NEGATIVE Final    Comment: (NOTE) SARS-CoV-2 target nucleic acids are NOT DETECTED.  The SARS-CoV-2 RNA is generally detectable in upper respiratoy specimens during the acute phase of infection. The lowest concentration of SARS-CoV-2 viral copies this assay can detect is 131 copies/mL. A negative result does not preclude SARS-Cov-2 infection and should not be used as the sole basis for treatment or other patient management decisions. A negative result may occur with  improper specimen collection/handling, submission of specimen other than nasopharyngeal swab, presence of viral mutation(s) within the areas targeted by this assay, and inadequate number of viral  copies (<131 copies/mL). A negative result must be combined with clinical observations, patient history, and epidemiological information. The expected result is Negative.  Fact Sheet for Patients:  https://www.moore.com/  Fact Sheet for Healthcare Providers:  https://www.young.biz/  This test is no t yet approved or cleared by the [place] FDA and  has been authorized for detection and/or diagnosis of SARS-CoV-2 by FDA under an Emergency Use Authorization (EUA). This EUA will remain  in effect (meaning this test can be used) for the duration of the COVID-19 declaration under Section 564(b)(1) of the Act, 21 U.S.C. section 360bbb-3(b)(1), unless the authorization is terminated or revoked sooner.     Influenza A by PCR NEGATIVE NEGATIVE Final   Influenza B by PCR NEGATIVE NEGATIVE Final    Comment: (NOTE) The Xpert Xpress SARS-CoV-2/FLU/RSV assay is intended as an aid in  the diagnosis of influenza from Nasopharyngeal swab specimens and  should not be used as a sole basis for treatment. Nasal washings and  aspirates are unacceptable for Xpert Xpress SARS-CoV-2/FLU/RSV  testing.  Fact Sheet for Patients: https://www.moore.com/  Fact Sheet for Healthcare Providers: https://www.young.biz/  This test is not yet approved or cleared by the [place] FDA and  has been authorized for detection and/or diagnosis of SARS-CoV-2 by  FDA under an Emergency Use Authorization (EUA). This EUA will remain  in effect (meaning this test can be used) for the duration of the  Covid-19 declaration under Section 564(b)(1) of the Act, 21  U.S.C. section 360bbb-3(b)(1), unless the authorization is  terminated or revoked. Performed at [facility], [address] [place], [place] [address]    Lipid Panel:     Component Value Date/Time   CHOL 204 (H) 02/24/2020 0805   TRIG 140 02/24/2020 0805   HDL  54 02/24/2020 0805   CHOLHDL 3.8 02/24/2020 0805   VLDL 28 02/24/2020 0805   LDLCALC 122 (H) 02/24/2020 0805   HgbA1c:  Lab Results  Component Value Date   HGBA1C 5.7 (H) 02/24/2020   Urine Drug Screen:     Component Value Date/Time   LABOPIA NONE DETECTED 05/16/2020 1857   COCAINSCRNUR NONE DETECTED 05/16/2020 1857   LABBENZ NONE DETECTED 05/16/2020 1857   AMPHETMU NONE DETECTED 05/16/2020 1857   THCU NONE DETECTED 05/16/2020 1857   LABBARB NONE DETECTED 05/16/2020 1857    Alcohol Level:  Recent Labs  Lab 05/16/20 1857  ETH <10    CT HEAD WO CONTRAST  Result Date: 05/17/2020 CLINICAL DATA:  Follow-up examination for acute stroke. EXAM: CT HEAD WITHOUT CONTRAST TECHNIQUE: Contiguous axial images were obtained from the base of the skull through the vertex without intravenous contrast. COMPARISON:  Prior CT from 05/16/2020. FINDINGS: Brain: Previously identified punctate parenchymal hemorrhage at the right parietal lobe again seen, which has mildly bloomed in the interim and is slightly more prominent, now measuring up to 6 mm (series 5, image 44). Additionally, there are at least 3 additional punctate parenchymal hemorrhages within the  adjacent right occipital lobe (series 5, images 48, 51, 52). Findings are suspected to of likely did present on prior exam, although are more now more prominent. Additional subtle focus of parenchymal hemorrhage at the left occipital lobe is also slightly more prominent measuring 4 mm (series 5, image 47). No other definite new hemorrhage. No significant mass effect. Encephalomalacia and gliosis at the right parietal lobe related to prior hemorrhage again noted. No other acute large vessel territory infarct. No extra-axial collection. Vascular: No hyperdense vessel. Skull: Scalp soft tissues within normal limits.  Calvarium intact. Sinuses/Orbits: Globes and orbital soft tissues within normal limits. Paranasal sinuses remain largely clear. Trace bilateral  mastoid effusions noted. Other: None. IMPRESSION: 1. Slight interval increase in conspicuity/size of previously identified punctate parenchymal hemorrhages involving the bilateral parieto-occipital regions, with a few additional punctate hemorrhages involving the right occipital region as above. These additional small hemorrhages are suspected to likely have been present on recent CT, but were extremely difficult to visualize at that time. No significant mass effect. 2. No other new acute intracranial abnormality. Electronically Signed   By: Rise Mu M.D.   On: 05/17/2020 01:03   CT HEAD CODE STROKE WO CONTRAST  Result Date: 05/16/2020 CLINICAL DATA:  Code stroke. Left hand numbness beginning today. History of amyloid angiopathy with multiple hemorrhages. EXAM: CT HEAD WITHOUT CONTRAST TECHNIQUE: Contiguous axial images were obtained from the base of the skull through the vertex without intravenous contrast. COMPARISON:  CT and MRI 03/01/2020. FINDINGS: Brain: No focal abnormality seen affecting the brainstem or cerebellum. Resolution of the majority of the intraparenchymal hemorrhage in the right posterior parietal region that was seen in July of this year. Encephalomalacia and gliosis in that region. 4 mm focus of hyperdensity in the nearby right posterior parietal brain could represent an additional recent hemorrhage, in this patient known to have had multiple previous hemorrhages in that region. Additional 2 mm punctate focus in the left parietal region could represent a tiny acute parenchymal hemorrhage. No intraventricular, subarachnoid or subdural blood. No hydrocephalus. Extra-axial collection. Vascular: There is atherosclerotic calcification of the major vessels at the base of the brain. Skull: Negative Sinuses/Orbits: Clear Other: None IMPRESSION: 1. Resolution of the majority of the intraparenchymal hemorrhage in the right posterior parietal region that was seen in July of this year. 2. 4  mm focus of hyperdensity in the nearby right posterior parietal brain could represent an additional recent hemorrhage, in this patient known to have had multiple previous hemorrhages in that region. Additional 2 mm focus in the left parietal region could represent a tiny acute parenchymal hemorrhage. 3. These results were called by telephone at the time of interpretation on 05/16/2020 at 6:56 pm to provider Geoffery Lyons , who verbally acknowledged these results. Electronically Signed   By: Paulina Fusi M.D.   On: 05/16/2020 18:59     EKG: normal sinus rhythm, nonspecific ST and T waves changes.   Physical Examination: Temp:  [97.5 F (36.4 C)-98.1 F (36.7 C)] 97.9 F (36.6 C) (10/07 0700) Pulse Rate:  [64-102] 85 (10/07 0700) Resp:  [12-23] 12 (10/07 0500) BP: (106-159)/(61-86) 139/69 (10/07 0700) SpO2:  [97 %-100 %] 99 % (10/07 0700) Weight:  [68.7 kg] 68.7 kg (10/07 0300)  General - Well nourished, well developed, in no apparent distress.  Ophthalmologic - fundi not visualized due to noncooperation.  Cardiovascular - Regular rate and rhythm.  Mental Status -  Level of arousal and orientation to time, place, and person were intact.  Language including expression, naming, repetition, comprehension was assessed and found intact.  Cranial Nerves II - XII - II - Chronic left hemianopia. III, IV, VI - Extraocular movements intact. V - Facial sensation intact bilaterally. VII - Facial movement intact bilaterally. VIII - Hearing & vestibular intact bilaterally. X - Palate elevates symmetrically. XI - Chin turning & shoulder shrug intact bilaterally. XII - Tongue protrusion intact.  Motor Strength - The patient's strength was normal in all extremities and pronator drift was absent.  Bulk was normal and fasciculations were absent.   Motor Tone - Muscle tone was assessed at the neck and appendages and was normal.  Reflexes - The patient's reflexes were symmetrical in all extremities and  she had no pathological reflexes.  Sensory - Light touch, temperature/pinprick were assessed and were symmetrical although subjective left hand tingling.    Coordination - The patient had normal movements in the hands with no ataxia or dysmetria.  Tremor was absent.  Gait and Station - deferred.   Assessment:  Stacy Flores is a 64 y.o. 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 She did not receive IV t-PA due to hx IPH and CAA.   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 New R posterior parietal hyperintensity likely IPH and L parietal tiny IPH.   CT head 6h slight internal increase in B parieto-occipital IPH w/ few new punctate R occipital IPHs likely previously there.  Repeat CT in am  LDL 122 July 2021  HgbA1c 5.15 February 2020   SCDs for VTE prophylaxis  aspirin 81 mg daily prior to admission, now on No antithrombotic. No aspirin life-long given CAA. Listed as an allergy by Dr. Iver NestleBhagat.  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 No DAPT or other antiplts given hx CAA and IPH  02/23/2020 - Right frontoparietal para-falcine ICH, likely due to known CAAwith recurrence adjacent to a previous hemorrhage    07/2019 - right frontoparietal WM ICH. MRA neg for AVM or aneurysm but consistent with CAA. CUS neg. EF 60-65%. LDL 93 and A1C 5.4. UDS neg. Recommend no antithrombotics and keep BP <130. resultant R field cut.  Seizures  01/2020 Suspected focal seizure LUE felt r/t to IPH  Treated w/ keppra   Continue Keppra 750 bid  Hypertension  Home meds: norvasc 10  Stable . SBP goal < 140 given IPH . Long-term BP goal  normotensive  Hyperlipidemia  Home meds:  No statin  LDL 122 July 2021  Recommend no statin d/t CAA w/ recurrent IPH  Other Stroke Risk Factors  Advanced age  Former Cigarette smoker  Hx ETOH use  Hx stroke/TIA - see above  No Family hx stroke   Other Active Problems    Hospital day # 0   Thank you for this consultation and allowing us to participate in the care of this patient. Will follow  Marvel PlanJindong Treavor Blomquist, MD PhD Stroke Neurology 05/17/2020 7:34 PM  To contact Stroke Continuity provider, please refer to [email or web address]. After hours, contact General Neurology

## 2020-05-17 NOTE — Progress Notes (Signed)
CT head completed  IMPRESSION: 1. Slight interval increase in conspicuity/size of previously identified punctate parenchymal hemorrhages involving the bilateral parieto-occipital regions, with a few additional punctate hemorrhages involving the right occipital region as above. These additional small hemorrhages are suspected to likely have been present on recent CT, but were extremely difficult to visualize at that time. No significant mass effect. 2. No other new acute intracranial abnormality.   Recs: BP control - SBP<140 Close neuromonitoring. Stroke team will continue to follow.  -- Milon Dikes, MD [facility] Pager: [phone] If 7pm to 7am, please call on call as listed on AMION.

## 2020-05-17 NOTE — Progress Notes (Signed)
Progress Note    Stacy Flores  ZOX:[id number] DOB: June 10, 1956  DOA: 05/16/2020 PCP: Drosinis, Leonia Reader, PA-C    Brief Narrative:    Medical records reviewed and are as summarized below:  Stacy Flores is an 64 y.o. female with medical history significant for cerebral amyloid angiopathy, history of intracranial hemorrhage, hypertension, and seizure disorder, now presenting to the emergency department for evaluation of left arm numbness and weakness and left facial droop.  Patient reports development of left hand numbness and tingling and decreased left grip strength that began around 4 PM.  Family noted that she also had a left facial droop and change in speech.  Patient states that she had a mild headache associated with this but that has since resolved.  She reports some minimal residual left grip deficit that had persisted since the ICH a few months ago.  She also had some left arm twitching this evening that has since resolved.  She denies any fevers, chills, chest pain, palpitations, cough, or shortness of breath.  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 Active Problems:   Intracerebral hemorrhage (HCC)   Essential hypertension   Seizure disorder (HCC)     Intracranial hemorrhage; cerebral amyloid angiopathy  - Patient with CAA and hx of ICH presents with left facial droop and left arm numbness and weakness and is found to have new hyperdensities on head CT suspicious for recurrent ICH  - Neurology consulting and much appreciated: repeat CT scan in AM -no ASA    Hypertension  -resume norvasc  Seizures  - Keppra started in June 2021 when she presented with suspected focal seizure involving LUE likely related to ICH  - Continue Keppra     Family Communication/Anticipated D/C date and plan/Code Status   DVT prophylaxis: scd Code Status: Full Code.  Disposition Plan: Status is: Observation  The patient  will require care spanning > 2 midnights and should be moved to inpatient because: Ongoing diagnostic testing needed not appropriate for outpatient work up  Dispo: The patient is from: Home              Anticipated d/c is to: Home              Anticipated d/c date is: 1 day              Patient currently is not medically stable to d/c.         Medical Consultants:    neurology     Subjective:   Says she already had 2 CT scans  Objective:    Vitals:   05/17/20 0300 05/17/20 0500 05/17/20 0700 05/17/20 1154  BP: 137/61 106/62 139/69 114/71  Pulse: 67 71 85 72  Resp: 17 12  18   Temp: 98.1 F (36.7 C)  97.9 F (36.6 C) 98.5 F (36.9 C)  TempSrc: Oral  Oral Oral  SpO2: 99% 99% 99% 99%  Weight: 68.7 kg     Height:        Intake/Output Summary (Last 24 hours) at 05/17/2020 1322 Last data filed at 05/17/2020 0400 Gross per 24 hour  Intake 260.51 ml  Output 750 ml  Net -489.49 ml   Filed Weights   05/17/20 0300  Weight: 68.7 kg    Exam:  General: Appearance:     Overweight female in no acute distress     Lungs:     Clear to auscultation bilaterally, respirations unlabored  Heart:    Normal heart rate. Normal rhythm. No murmurs, rubs, or gallops.   MS:   All extremities are intact.        Data Reviewed:   I have personally reviewed following labs and imaging studies:  Labs: Labs show the following:   Basic Metabolic Panel: Recent Labs  Lab 05/16/20 2012  NA 137  K 3.6  CL 100  CO2 25  GLUCOSE 119*  BUN 9  CREATININE 0.59  CALCIUM 9.4   GFR Estimated Creatinine Clearance: 68.7 mL/min (by C-G formula based on SCr of 0.59 mg/dL). Liver Function Tests: Recent Labs  Lab 05/16/20 2012  AST 21  ALT 27  ALKPHOS 69  BILITOT 0.3  PROT 7.9  ALBUMIN 4.4   No results for input(s): LIPASE, AMYLASE in the last 168 hours. No results for input(s): AMMONIA in the last 168 hours. Coagulation profile Recent Labs  Lab 05/16/20 2012  INR 1.0     CBC: Recent Labs  Lab 05/16/20 1857  WBC 6.5  NEUTROABS 4.5  HGB 13.2  HCT 40.9  MCV 86.1  PLT 265   Cardiac Enzymes: No results for input(s): CKTOTAL, CKMB, CKMBINDEX, TROPONINI in the last 168 hours. BNP (last 3 results) No results for input(s): PROBNP in the last 8760 hours. CBG: Recent Labs  Lab 05/16/20 1834  GLUCAP 106*   D-Dimer: No results for input(s): DDIMER in the last 72 hours. Hgb A1c: No results for input(s): HGBA1C in the last 72 hours. Lipid Profile: No results for input(s): CHOL, HDL, LDLCALC, TRIG, CHOLHDL, LDLDIRECT in the last 72 hours. Thyroid function studies: No results for input(s): TSH, T4TOTAL, T3FREE, THYROIDAB in the last 72 hours.  Invalid input(s): FREET3 Anemia work up: No results for input(s): VITAMINB12, FOLATE, FERRITIN, TIBC, IRON, RETICCTPCT in the last 72 hours. Sepsis Labs: Recent Labs  Lab 05/16/20 1857  WBC 6.5    Microbiology Recent Results (from the past 240 hour(s))  Respiratory Panel by RT PCR (Flu A&B, Covid) - Urine, Clean Catch     Status: None   Collection Time: 05/16/20  6:58 PM   Specimen: Urine, Clean Catch; Nasopharyngeal  Result Value Ref Range Status   SARS Coronavirus 2 by RT PCR NEGATIVE NEGATIVE Final    Comment: (NOTE) SARS-CoV-2 target nucleic acids are NOT DETECTED.  The SARS-CoV-2 RNA is generally detectable in upper respiratoy specimens during the acute phase of infection. The lowest concentration of SARS-CoV-2 viral copies this assay can detect is 131 copies/mL. A negative result does not preclude SARS-Cov-2 infection and should not be used as the sole basis for treatment or other patient management decisions. A negative result may occur with  improper specimen collection/handling, submission of specimen other than nasopharyngeal swab, presence of viral mutation(s) within the areas targeted by this assay, and inadequate number of viral copies (<131 copies/mL). A negative result must be  combined with clinical observations, patient history, and epidemiological information. The expected result is Negative.  Fact Sheet for Patients:  https://www.moore.com/  Fact Sheet for Healthcare Providers:  https://www.young.biz/  This test is no t yet approved or cleared by the [place] FDA and  has been authorized for detection and/or diagnosis of SARS-CoV-2 by FDA under an Emergency Use Authorization (EUA). This EUA will remain  in effect (meaning this test can be used) for the duration of the COVID-19 declaration under Section 564(b)(1) of the Act, 21 U.S.C. section 360bbb-3(b)(1), unless the authorization is terminated or revoked sooner.     Influenza  A by PCR NEGATIVE NEGATIVE Final   Influenza B by PCR NEGATIVE NEGATIVE Final    Comment: (NOTE) The Xpert Xpress SARS-CoV-2/FLU/RSV assay is intended as an aid in  the diagnosis of influenza from Nasopharyngeal swab specimens and  should not be used as a sole basis for treatment. Nasal washings and  aspirates are unacceptable for Xpert Xpress SARS-CoV-2/FLU/RSV  testing.  Fact Sheet for Patients: https://www.moore.com/https://www.fda.gov/media/142436/download  Fact Sheet for Healthcare Providers: https://www.young.biz/https://www.fda.gov/media/142435/download  This test is not yet approved or cleared by the [place] FDA and  has been authorized for detection and/or diagnosis of SARS-CoV-2 by  FDA under an Emergency Use Authorization (EUA). This EUA will remain  in effect (meaning this test can be used) for the duration of the  Covid-19 declaration under Section 564(b)(1) of the Act, 21  U.S.C. section 360bbb-3(b)(1), unless the authorization is  terminated or revoked. Performed at [facility], [address] [place], [place] [address]     Procedures and diagnostic studies:  CT HEAD WO CONTRAST  Result Date: 05/17/2020 CLINICAL DATA:  Follow-up examination for acute stroke. EXAM: CT HEAD WITHOUT  CONTRAST TECHNIQUE: Contiguous axial images were obtained from the base of the skull through the vertex without intravenous contrast. COMPARISON:  Prior CT from 05/16/2020. FINDINGS: Brain: Previously identified punctate parenchymal hemorrhage at the right parietal lobe again seen, which has mildly bloomed in the interim and is slightly more prominent, now measuring up to 6 mm (series 5, image 44). Additionally, there are at least 3 additional punctate parenchymal hemorrhages within the adjacent right occipital lobe (series 5, images 48, 51, 52). Findings are suspected to of likely did present on prior exam, although are more now more prominent. Additional subtle focus of parenchymal hemorrhage at the left occipital lobe is also slightly more prominent measuring 4 mm (series 5, image 47). No other definite new hemorrhage. No significant mass effect. Encephalomalacia and gliosis at the right parietal lobe related to prior hemorrhage again noted. No other acute large vessel territory infarct. No extra-axial collection. Vascular: No hyperdense vessel. Skull: Scalp soft tissues within normal limits.  Calvarium intact. Sinuses/Orbits: Globes and orbital soft tissues within normal limits. Paranasal sinuses remain largely clear. Trace bilateral mastoid effusions noted. Other: None. IMPRESSION: 1. Slight interval increase in conspicuity/size of previously identified punctate parenchymal hemorrhages involving the bilateral parieto-occipital regions, with a few additional punctate hemorrhages involving the right occipital region as above. These additional small hemorrhages are suspected to likely have been present on recent CT, but were extremely difficult to visualize at that time. No significant mass effect. 2. No other new acute intracranial abnormality. Electronically Signed   By: Rise MuBenjamin  McClintock M.D.   On: 05/17/2020 01:03   CT HEAD CODE STROKE WO CONTRAST  Result Date: 05/16/2020 CLINICAL DATA:  Code stroke.  Left hand numbness beginning today. History of amyloid angiopathy with multiple hemorrhages. EXAM: CT HEAD WITHOUT CONTRAST TECHNIQUE: Contiguous axial images were obtained from the base of the skull through the vertex without intravenous contrast. COMPARISON:  CT and MRI 03/01/2020. FINDINGS: Brain: No focal abnormality seen affecting the brainstem or cerebellum. Resolution of the majority of the intraparenchymal hemorrhage in the right posterior parietal region that was seen in July of this year. Encephalomalacia and gliosis in that region. 4 mm focus of hyperdensity in the nearby right posterior parietal brain could represent an additional recent hemorrhage, in this patient known to have had multiple previous hemorrhages in that region. Additional 2 mm punctate focus  in the left parietal region could represent a tiny acute parenchymal hemorrhage. No intraventricular, subarachnoid or subdural blood. No hydrocephalus. Extra-axial collection. Vascular: There is atherosclerotic calcification of the major vessels at the base of the brain. Skull: Negative Sinuses/Orbits: Clear Other: None IMPRESSION: 1. Resolution of the majority of the intraparenchymal hemorrhage in the right posterior parietal region that was seen in July of this year. 2. 4 mm focus of hyperdensity in the nearby right posterior parietal brain could represent an additional recent hemorrhage, in this patient known to have had multiple previous hemorrhages in that region. Additional 2 mm focus in the left parietal region could represent a tiny acute parenchymal hemorrhage. 3. These results were called by telephone at the time of interpretation on 05/16/2020 at 6:56 pm to provider Geoffery Lyons , who verbally acknowledged these results. Electronically Signed   By: Paulina Fusi M.D.   On: 05/16/2020 18:59    Medications:   . levETIRAcetam  750 mg Oral BID   Continuous Infusions:   LOS: 0 days   Joseph Art  [facility]   How to  contact the [facility] Attending or Consulting provider 7A - 7P or covering provider during after hours 7P -7A, for this patient?  1. Check the care team in [facility] and look for a) attending/consulting [facility] provider listed and b) the [facility] team listed 2. Log into [email or web address] and use [place]'s universal password to access. If you do not have the password, please contact the hospital operator. 3. Locate the [facility] provider you are looking for under [facility] and page to a number that you can be directly reached. 4. If you still have difficulty reaching the provider, please page the [facility] (Director on Call) for the Hospitalists listed on amion for assistance.  05/17/2020, 1:22 PM

## 2020-05-18 ENCOUNTER — Inpatient Hospital Stay (HOSPITAL_COMMUNITY): Payer: Self-pay

## 2020-05-18 NOTE — Progress Notes (Signed)
Physical Therapy Treatment Patient Details Name: Stacy Flores MRN: [id number] DOB: 1956-06-28 Today's Date: 05/18/2020    History of Present Illness Patient is a 64 year old female who presented with L UE weakness, numbness, and spasms along with L facial droop and a change in speech. She has a history of an intracranial hemorrhage in December 2020 and recurrence in July 2021. CT displayed slight interval inc in size of previous punctate parenchymal hemorrhages involving B parieto-occipital regions with a few additional punctate hemorrhages involving R occipital region. NIH initially 6. Medical hx of previous CVAs, seizures, and HTN.    PT Comments    Pt progressing towards her goals. Practiced scanning her surroundings by turning her head while ambulating to find different objects. Pt continues to display L side visual deficits, impacting her safety. Pt and pt's daughter educated on necessity to look regularly to the L to avoid obstacles and dec risk for injuries. Pt continues to require 1 UE support when negotiating stairs due to her mild balance and LE strength deficits. Educated pt on ascending with R LE leading and descending stairs with L LE leading, with good demonstration of understanding noted. Pt would benefit from outpatient PT services to address her deficits to maximize her independence and safety with functional mobility.    Follow Up Recommendations  Outpatient PT;Supervision for mobility/OOB     Equipment Recommendations  None recommended by PT    Recommendations for Other Services       Precautions / Restrictions Precautions Precautions: Fall Precaution Comments: seizures; L field cut Restrictions Weight Bearing Restrictions: No    Mobility  Bed Mobility               General bed mobility comments: up in chair, returned to chair  Transfers Overall transfer level: Needs assistance Equipment used: Rolling walker (2 wheeled);None Transfers: Sit to/from  Stand Sit to Stand: Supervision         General transfer comment: Cues provided for proper hand placement with coming to stand if utilizing an assistive device for gait.  Ambulation/Gait Ambulation/Gait assistance: Min guard Gait Distance (Feet): 225 Feet Assistive device: None (20 ft with RW in room) Gait Pattern/deviations: Step-through pattern (slow gait speed) Gait velocity: decr Gait velocity interpretation: <1.31 ft/sec, indicative of household ambulator General Gait Details: Ambualtes at slow pace, no LOB noted but minor path deviation when turning head to scan surroundings while walking.   Stairs Stairs: Yes Stairs assistance: Min guard Stair Management: One rail Right;Step to pattern Number of Stairs: 2 General stair comments: VC's to lead up with R LE and down with L, with success. No LOB noted with pt requiring R UE support on R hand rail despite cues to attempt without UE support, displaying step-to gait pattern.   Wheelchair Mobility    Modified Rankin (Stroke Patients Only) Modified Rankin (Stroke Patients Only) Pre-Morbid Rankin Score: Moderate disability Modified Rankin: Moderately severe disability     Balance Overall balance assessment: Mild deficits observed, not formally tested (inc assist due to L side visual deficits)                                          Cognition Arousal/Alertness: Awake/alert Behavior During Therapy: WFL for tasks assessed/performed Overall Cognitive Status: History of cognitive impairments - at baseline Area of Impairment: Safety/judgement;Awareness;Problem solving  Safety/Judgement: Decreased awareness of safety;Decreased awareness of deficits Awareness: Emergent Problem Solving: Slow processing;Requires verbal cues General Comments: Pt requires cues to turn head and scan surroundings to the L for safety purposes. Reminders provided for proper sequencing with stair  negotiation.      Exercises      General Comments        Pertinent Vitals/Pain Pain Assessment: No/denies pain    Home Living                      Prior Function            PT Goals (current goals can now be found in the care plan section) Acute Rehab PT Goals Patient Stated Goal: to go home PT Goal Formulation: With patient/family Time For Goal Achievement: 05/24/20 Potential to Achieve Goals: Good Progress towards PT goals: Progressing toward goals    Frequency    Min 4X/week      PT Plan Current plan remains appropriate    Co-evaluation              AM-PAC PT "6 Clicks" Mobility   Outcome Measure  Help needed turning from your back to your side while in a flat bed without using bedrails?: None Help needed moving from lying on your back to sitting on the side of a flat bed without using bedrails?: None Help needed moving to and from a bed to a chair (including a wheelchair)?: A Little Help needed standing up from a chair using your arms (e.g., wheelchair or bedside chair)?: A Little Help needed to walk in hospital room?: A Little Help needed climbing 3-5 steps with a railing? : A Little 6 Click Score: 20    End of Session Equipment Utilized During Treatment: Gait belt Activity Tolerance: Patient tolerated treatment well Patient left: in chair;with call bell/phone within reach;with chair alarm set;with family/visitor present (daughter present in room)   PT Visit Diagnosis: Other abnormalities of gait and mobility (R26.89);Other symptoms and signs involving the nervous system (R29.898);Hemiplegia and hemiparesis Hemiplegia - Right/Left: Left Hemiplegia - caused by: Other Nontraumatic intracranial hemorrhage     Time: 9767-3419 PT Time Calculation (min) (ACUTE ONLY): 16 min  Charges:  $Gait Training: 8-22 mins                     Raymond Gurney, PT, DPT Acute Rehabilitation Services  Pager: [phone] Office:  [phone]    Jewel Baize 05/18/2020, 2:00 PM

## 2020-05-18 NOTE — Progress Notes (Addendum)
STROKE TEAM PROGRESS NOTE   INTERVAL HISTORY Pt RN at bedside. Pt doing well, repeat CT no progression of small ICH. Plan to d/c today. No more ASA and educated her on BP monitoring at home.   Vitals:   05/17/20 1154 05/17/20 1655 05/17/20 2007 05/18/20 0926  BP: 114/71  112/71 134/83  Pulse: 72 80 82 73  Resp: 18  20 19   Temp: 98.5 F (36.9 C) 98.8 F (37.1 C) 97.9 F (36.6 C) 98.4 F (36.9 C)  TempSrc: Oral Oral Oral Oral  SpO2: 99% 98% 98% 100%  Weight:      Height:       CBC:  Recent Labs  Lab 05/16/20 1857  WBC 6.5  NEUTROABS 4.5  HGB 13.2  HCT 40.9  MCV 86.1  PLT 265   Basic Metabolic Panel:  Recent Labs  Lab 05/16/20 2012  NA 137  K 3.6  CL 100  CO2 25  GLUCOSE 119*  BUN 9  CREATININE 0.59  CALCIUM 9.4   Lipid Panel: No results for input(s): CHOL, TRIG, HDL, CHOLHDL, VLDL, LDLCALC in the last 168 hours. HgbA1c: No results for input(s): HGBA1C in the last 168 hours. Urine Drug Screen:  Recent Labs  Lab 05/16/20 1857  LABOPIA NONE DETECTED  COCAINSCRNUR NONE DETECTED  LABBENZ NONE DETECTED  AMPHETMU NONE DETECTED  THCU NONE DETECTED  LABBARB NONE DETECTED    Alcohol Level  Recent Labs  Lab 05/16/20 1857  ETH <10    IMAGING past 24 hours CT HEAD WO CONTRAST  Result Date: 05/18/2020 CLINICAL DATA:  Intracranial nontraumatic hemorrhage EXAM: CT HEAD WITHOUT CONTRAST TECHNIQUE: Contiguous axial images were obtained from the base of the skull through the vertex without intravenous contrast. COMPARISON:  Yesterday FINDINGS: Brain: Sequela of remote cerebral hemorrhages in the right occipital lobe with cortical distortion and white matter low-density. There are subcentimeter right more than left occipital parietal peripheral high-density areas, likely petechial hemorrhages in this patient with cerebral amyloid on aspirin. No progression or interval edema. No infarct, hydrocephalus, or collection. Vascular: Negative Skull: Negative Sinuses/Orbits:  Dysconjugate gaze, nonspecific IMPRESSION: No progression of small occipital parietal hemorrhages. No new abnormality. Electronically Signed   By: 07/18/2020 M.D.   On: 05/18/2020 04:17    PHYSICAL EXAM   Temp:  [97.9 F (36.6 C)-98.8 F (37.1 C)] 98.4 F (36.9 C) (10/08 0926) Pulse Rate:  [73-82] 73 (10/08 0926) Resp:  [19-20] 19 (10/08 0926) BP: (112-134)/(71-83) 134/83 (10/08 0926) SpO2:  [98 %-100 %] 100 % (10/08 0926)  General - Well nourished, well developed, in no apparent distress.  Ophthalmologic - fundi not visualized due to noncooperation.  Cardiovascular - Regular rate and rhythm.  Mental Status -  Level of arousal and orientation to time, place, and person were intact. Language including expression, naming, repetition, comprehension was assessed and found intact.  Cranial Nerves II - XII - II - Chronic left hemianopia. III, IV, VI - Extraocular movements intact. V - Facial sensation intact bilaterally. VII - Facial movement intact bilaterally. VIII - Hearing & vestibular intact bilaterally. X - Palate elevates symmetrically. XI - Chin turning & shoulder shrug intact bilaterally. XII - Tongue protrusion intact.  Motor Strength - The patient's strength was normal in all extremities and pronator drift was absent.  Bulk was normal and fasciculations were absent.   Motor Tone - Muscle tone was assessed at the neck and appendages and was normal.  Reflexes - The patient's reflexes were symmetrical in all extremities  and she had no pathological reflexes.  Sensory - Light touch, temperature/pinprick were assessed and were symmetrical.   Coordination - The patient had normal movements in the hands with no ataxia or dysmetria.  Tremor was absent.  Gait and Station - deferred.   ASSESSMENT/PLAN Stacy Flores is a 64 y.o. female with history of CAA w/ IPH in Dec 2020 on aspirin 41 presenting with L hand numbness w/ tremulousness followed by  weakness. She did not receive IV t-PA due to hx IPH and CAA.   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 New R posterior parietal hyperintensity likely IPH and L parietal tiny IPH.   CT head 6h slight internal increase in B parieto-occipital IPH w/ few new punctate R occipital IPHs likely previously there.  Repeat CT stable, no progression  LDL 122 July 2021  HgbA1c 5.15 February 2020   VTE prophylaxis - SCDs   aspirin 81 mg daily prior to admission, now on No antithrombotic. No aspirin life-long given CAA. Listed as an allergy by Dr. Iver Nestle.  Therapy recommendations:  OP PT/OT  Disposition:  Return home NOTHING FURTHER TO ADD FROM THE STROKE STANDPOINT Stroke Service will sign off. Please call should any needs arise. Follow-up Dr./ Aubuchon [facility], appt 06/05/2020 at 1120  Cerebral Amyloid Angiopathy w/ recurrent ICH and stroke  03/02/2020 - R punctate cerebellar infarct d/t small vessel disease, started on asa 81. No DAPT or other antiplts given hx CAA and IPH  02/23/2020 - Right frontoparietal para-falcine ICH, likely due to known CAAwith recurrence adjacent to a previous hemorrhage   07/2019 - right frontoparietal WM ICH.MRA neg for AVM or aneurysm but consistent with CAA. CUS neg. EF 60-65%. LDL 93 and A1C 5.4. UDS neg. Recommend no antithrombotics and keep BP <130.resultant R field cut.  Seizures  01/2020 Suspected focal seizure LUE felt r/t to IPH  Treated w/ keppra   Continue Keppra 750 bid  Hypertension  Home meds:  norvasc 10  Stable . SBP goal < 140 given IPH . Long-term BP goal normotensive  Hyperlipidemia  Home meds:  No statin  LDL 122 July 2021  Recommend no statin d/t CAA w/ recurrent IPH  Other Stroke Risk Factors  Advanced age  Former Cigarette smoker  Hx ETOH use  Hx stroke/TIA - see above  No Family hx stroke   Other Active Problems    Hospital day #  1  Neurology will sign off. Please call with questions. Pt will follow-up Dr./ Aubuchon [facility], appt 06/05/2020 at 1120. Thanks for the consult.  Marvel Plan, MD PhD Stroke Neurology 05/18/2020 6:06 PM   To contact Stroke Continuity provider, please refer to [email or web address]. After hours, contact General Neurology

## 2020-05-18 NOTE — Plan of Care (Signed)
  Problem: Clinical Measurements: Goal: Ability to maintain clinical measurements within normal limits will improve Outcome: Progressing Goal: Will remain free from infection Outcome: Progressing Goal: Diagnostic test results will improve Outcome: Progressing   Problem: Elimination: Goal: Will not experience complications related to bowel motility Outcome: Progressing   Problem: Safety: Goal: Ability to remain free from injury will improve Outcome: Progressing   Problem: Education: Goal: Knowledge of secondary prevention will improve Outcome: Progressing Goal: Knowledge of patient specific risk factors addressed and post discharge goals established will improve Outcome: Progressing   Problem: Intracerebral Hemorrhage Tissue Perfusion: Goal: Complications of Intracerebral Hemorrhage will be minimized Outcome: Progressing

## 2020-05-18 NOTE — Discharge Instructions (Signed)
NO DRIVING

## 2020-05-18 NOTE — Progress Notes (Signed)
Occupational Therapy Treatment Patient Details Name: Stacy Flores MRN: [id number] DOB: 1956/03/09 Today's Date: 05/18/2020    History of present illness Patient is a 64 year old female who presented with L UE weakness, numbness, and spasms along with L facial droop and a change in speech. She has a history of an intracranial hemorrhage in December 2020 and recurrence in July 2021. CT displayed slight interval inc in size of previous punctate parenchymal hemorrhages involving B parieto-occipital regions with a few additional punctate hemorrhages involving R occipital region. NIH initially 6. Medical hx of previous CVAs, seizures, and HTN.   OT comments  Pt seen for follow up OT session with focus on vision education and safety with daughter present. Issued pt and daughter visual field cut handout and provided education on the following: NO driving (pt and daughter both verbalized this session, also informed MD), having person on L side when walking out in public, increasing lighting around home, removing throw rugs/fall hazards, and grounding techniques. Pt practiced grounding techniques with therapist, reinforced compensation of turning head with ADL. All of daughter's questions answered at this time. Educated pt and daughter they can follow up with neurologist for full visual field testing. Will continue to follow while acute.   Follow Up Recommendations  Outpatient OT;Supervision/Assistance - 24 hour;Other (comment) (OP Neuro)    Equipment Recommendations  Tub/shower seat    Recommendations for Other Services      Precautions / Restrictions Precautions Precautions: Fall Precaution Comments: seizures; L field cut Restrictions Weight Bearing Restrictions: No       Mobility Bed Mobility               General bed mobility comments: up in chair, returned to chair  Transfers Overall transfer level: Needs assistance Equipment used: Rolling walker (2  wheeled);None Transfers: Sit to/from Stand Sit to Stand: Supervision         General transfer comment: Cues provided for proper hand placement with coming to stand if utilizing an assistive device for gait.    Balance Overall balance assessment: Mild deficits observed, not formally tested (inc assist due to L side visual deficits)                                         ADL either performed or assessed with clinical judgement   ADL                                         General ADL Comments: Pt overall supervision for ADLs this session to maintain safety due to large L visual field deficit. Session focused on education and safety with managing a visual field deficit. Daughter present for education as well/     Vision Baseline Vision/History: Wears glasses Wears Glasses: At all times Patient Visual Report: Peripheral vision impairment Vision Assessment?: Yes Eye Alignment: Impaired (comment) (disconjugate) Ocular Range of Motion: Restricted on the left;Impaired-to be further tested in functional context Tracking/Visual Pursuits: Decreased smoothness of vertical tracking;Decreased smoothness of eye movement to LEFT superior field;Decreased smoothness of eye movement to LEFT inferior field;Requires cues, head turns, or add eye shifts to track;Impaired - to be further tested in functional context Visual Fields: Left visual field deficit;Impaired-to be further tested in functional context;Other (comment) (not seeing on L past midline) Additional Comments:  Continuing to present with L visual field deficit. Daughter present and given education on field deficits and its implications in ADL/IADL and safety.   Perception     Praxis      Cognition Arousal/Alertness: Awake/alert Behavior During Therapy: WFL for tasks assessed/performed Overall Cognitive Status: History of cognitive impairments - at baseline Area of Impairment:  Safety/judgement;Awareness;Problem solving                         Safety/Judgement: Decreased awareness of safety;Decreased awareness of deficits Awareness: Emergent Problem Solving: Slow processing;Requires verbal cues General Comments: Pt requires cues to turn head and scan surroundings to the L for safety purposes. Reminders provided for proper sequencing with stair negotiation.        Exercises     Shoulder Instructions       General Comments      Pertinent Vitals/ Pain       Pain Assessment: No/denies pain  Home Living                                          Prior Functioning/Environment              Frequency  Min 2X/week        Progress Toward Goals  OT Goals(current goals can now be found in the care plan section)  Progress towards OT goals: Progressing toward goals  Acute Rehab OT Goals Patient Stated Goal: to go home OT Goal Formulation: With patient Time For Goal Achievement: 05/31/20 Potential to Achieve Goals: Good  Plan Discharge plan remains appropriate    Co-evaluation                 AM-PAC OT "6 Clicks" Daily Activity     Outcome Measure   Help from another person eating meals?: A Little Help from another person taking care of personal grooming?: A Little Help from another person toileting, which includes using toliet, bedpan, or urinal?: A Little Help from another person bathing (including washing, rinsing, drying)?: A Little Help from another person to put on and taking off regular upper body clothing?: A Little Help from another person to put on and taking off regular lower body clothing?: A Little 6 Click Score: 18    End of Session    OT Visit Diagnosis: Unsteadiness on feet (R26.81);Other symptoms and signs involving the nervous system (R29.898);Low vision, both eyes (H54.2);Muscle weakness (generalized) (M62.81)   Activity Tolerance Patient tolerated treatment well   Patient Left in  chair;with call bell/phone within reach;with family/visitor present   Nurse Communication Mobility status        Time: 2637-8588 OT Time Calculation (min): 9 min  Charges: OT General Charges $OT Visit: 1 Visit OT Treatments $Neuromuscular Re-education: 8-22 mins  Dalphine Handing, MSOT, OTR/L Acute Rehabilitation Services [facility] Office Number: [phone] Pager: [phone]  Dalphine Handing 05/18/2020, 2:00 PM

## 2020-05-18 NOTE — Discharge Summary (Signed)
Physician Discharge Summary  Stacy Flores EVO:[id number] DOB: 10/26/1955 DOA: 05/16/2020  PCP: Brayton El, PA-C  Admit date: 05/16/2020 Discharge date: 05/18/2020  Admitted From: home Discharge disposition: home   Recommendations for Outpatient Follow-Up:   1. No ASA 2. No driving 3. 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  Diet recommendation: Low sodium, heart healthy.  Wound care: None.  Code status: Full.   History of Present Illness:   Stacy Flores is a 64 y.o. female with medical history significant for cerebral amyloid angiopathy, history of intracranial hemorrhage, hypertension, and seizure disorder, now presenting to the emergency department for evaluation of left arm numbness and weakness and left facial droop.  Patient reports development of left hand numbness and tingling and decreased left grip strength that began around 4 PM.  Family noted that she also had a left facial droop and change in speech.  Patient states that she had a mild headache associated with this but that has since resolved.  She reports some minimal residual left grip deficit that had persisted since the ICH a few months ago.  She also had some left arm twitching this evening that has since resolved.  She denies any fevers, chills, chest pain, palpitations, cough, or shortness of breath.   Hospital Course by Problem:   Intracranial hemorrhage; cerebral amyloid angiopathy -Patient with CAA and hx of ICH presents with left facial droop and left arm numbness and weakness and is found to have new hyperdensities on head CT suspicious for recurrent ICH -Neurology consulting and much appreciated: repeat CT scan on  10/9 normal -no ASA   Hypertension -resume norvasc -SBP goal <140  Seizures  - Keppra started in June 2021 when she presented with suspected focal seizure involving LUE likely related to ICH -Continue Keppra    Medical Consultants:   neurology   Discharge Exam:   Vitals:   05/17/20 2007 05/18/20 0926  BP: 112/71 134/83  Pulse: 82 73  Resp: 20 19  Temp: 97.9 F (36.6 C) 98.4 F (36.9 C)  SpO2: 98% 100%   Vitals:   05/17/20 1154 05/17/20 1655 05/17/20 2007 05/18/20 0926  BP: 114/71  112/71 134/83  Pulse: 72 80 82 73  Resp: 18  20 19   Temp: 98.5 F (36.9 C) 98.8 F (37.1 C) 97.9 F (36.6 C) 98.4 F (36.9 C)  TempSrc: Oral Oral Oral Oral  SpO2: 99% 98% 98% 100%  Weight:      Height:        General exam: Appears calm and comfortable.   The results of significant diagnostics from this hospitalization (including imaging, microbiology, ancillary and laboratory) are listed below for reference.     Procedures and Diagnostic Studies:   CT HEAD WO CONTRAST  Result Date: 05/17/2020 CLINICAL DATA:  Follow-up examination for acute stroke. EXAM: CT HEAD WITHOUT CONTRAST TECHNIQUE: Contiguous axial images were obtained from the base of the skull through the vertex without intravenous contrast. COMPARISON:  Prior CT from 05/16/2020. FINDINGS: Brain: Previously identified punctate parenchymal hemorrhage at the right parietal lobe again seen, which has mildly bloomed in the interim and is slightly more prominent, now measuring up to 6 mm (series 5, image 44). Additionally, there are at least 3  additional punctate parenchymal hemorrhages within the adjacent right occipital lobe (series 5, images 48, 51, 52). Findings are suspected to of likely did present on prior exam, although are more now more prominent. Additional subtle focus of parenchymal hemorrhage at the left occipital lobe is also slightly more prominent measuring 4 mm (series 5, image 47). No other definite new  hemorrhage. No significant mass effect. Encephalomalacia and gliosis at the right parietal lobe related to prior hemorrhage again noted. No other acute large vessel territory infarct. No extra-axial collection. Vascular: No hyperdense vessel. Skull: Scalp soft tissues within normal limits.  Calvarium intact. Sinuses/Orbits: Globes and orbital soft tissues within normal limits. Paranasal sinuses remain largely clear. Trace bilateral mastoid effusions noted. Other: None. IMPRESSION: 1. Slight interval increase in conspicuity/size of previously identified punctate parenchymal hemorrhages involving the bilateral parieto-occipital regions, with a few additional punctate hemorrhages involving the right occipital region as above. These additional small hemorrhages are suspected to likely have been present on recent CT, but were extremely difficult to visualize at that time. No significant mass effect. 2. No other new acute intracranial abnormality. Electronically Signed   By: Rise Mu M.D.   On: 05/17/2020 01:03   CT HEAD CODE STROKE WO CONTRAST  Result Date: 05/16/2020 CLINICAL DATA:  Code stroke. Left hand numbness beginning today. History of amyloid angiopathy with multiple hemorrhages. EXAM: CT HEAD WITHOUT CONTRAST TECHNIQUE: Contiguous axial images were obtained from the base of the skull through the vertex without intravenous contrast. COMPARISON:  CT and MRI 03/01/2020. FINDINGS: Brain: No focal abnormality seen affecting the brainstem or cerebellum. Resolution of the majority of the intraparenchymal hemorrhage in the right posterior parietal region that was seen in July of this year. Encephalomalacia and gliosis in that region. 4 mm focus of hyperdensity in the nearby right posterior parietal brain could represent an additional recent hemorrhage, in this patient known to have had multiple previous hemorrhages in that region. Additional 2 mm punctate focus in the left parietal region could represent  a tiny acute parenchymal hemorrhage. No intraventricular, subarachnoid or subdural blood. No hydrocephalus. Extra-axial collection. Vascular: There is atherosclerotic calcification of the major vessels at the base of the brain. Skull: Negative Sinuses/Orbits: Clear Other: None IMPRESSION: 1. Resolution of the majority of the intraparenchymal hemorrhage in the right posterior parietal region that was seen in July of this year. 2. 4 mm focus of hyperdensity in the nearby right posterior parietal brain could represent an additional recent hemorrhage, in this patient known to have had multiple previous hemorrhages in that region. Additional 2 mm focus in the left parietal region could represent a tiny acute parenchymal hemorrhage. 3. These results were called by telephone at the time of interpretation on 05/16/2020 at 6:56 pm to provider Geoffery Lyons , who verbally acknowledged these results. Electronically Signed   By: Paulina Fusi M.D.   On: 05/16/2020 18:59     Labs:   Basic Metabolic Panel: Recent Labs  Lab 05/16/20 2012  NA 137  K 3.6  CL 100  CO2 25  GLUCOSE 119*  BUN 9  CREATININE 0.59  CALCIUM 9.4   GFR Estimated Creatinine Clearance: 68.7 mL/min (by C-G formula based on SCr of 0.59 mg/dL). Liver Function Tests: Recent Labs  Lab 05/16/20 2012  AST 21  ALT 27  ALKPHOS 69  BILITOT 0.3  PROT 7.9  ALBUMIN 4.4   No results for input(s): LIPASE, AMYLASE in the last 168 hours. No results for input(s): AMMONIA in the last 168  hours. Coagulation profile Recent Labs  Lab 05/16/20 2012  INR 1.0    CBC: Recent Labs  Lab 05/16/20 1857  WBC 6.5  NEUTROABS 4.5  HGB 13.2  HCT 40.9  MCV 86.1  PLT 265   Cardiac Enzymes: No results for input(s): CKTOTAL, CKMB, CKMBINDEX, TROPONINI in the last 168 hours. BNP: Invalid input(s): POCBNP CBG: Recent Labs  Lab 05/16/20 1834  GLUCAP 106*   D-Dimer No results for input(s): DDIMER in the last 72 hours. Hgb A1c No results for  input(s): HGBA1C in the last 72 hours. Lipid Profile No results for input(s): CHOL, HDL, LDLCALC, TRIG, CHOLHDL, LDLDIRECT in the last 72 hours. Thyroid function studies No results for input(s): TSH, T4TOTAL, T3FREE, THYROIDAB in the last 72 hours.  Invalid input(s): FREET3 Anemia work up No results for input(s): VITAMINB12, FOLATE, FERRITIN, TIBC, IRON, RETICCTPCT in the last 72 hours. Microbiology Recent Results (from the past 240 hour(s))  Respiratory Panel by RT PCR (Flu A&B, Covid) - Urine, Clean Catch     Status: None   Collection Time: 05/16/20  6:58 PM   Specimen: Urine, Clean Catch; Nasopharyngeal  Result Value Ref Range Status   SARS Coronavirus 2 by RT PCR NEGATIVE NEGATIVE Final    Comment: (NOTE) SARS-CoV-2 target nucleic acids are NOT DETECTED.  The SARS-CoV-2 RNA is generally detectable in upper respiratoy specimens during the acute phase of infection. The lowest concentration of SARS-CoV-2 viral copies this assay can detect is 131 copies/mL. A negative result does not preclude SARS-Cov-2 infection and should not be used as the sole basis for treatment or other patient management decisions. A negative result may occur with  improper specimen collection/handling, submission of specimen other than nasopharyngeal swab, presence of viral mutation(s) within the areas targeted by this assay, and inadequate number of viral copies (<131 copies/mL). A negative result must be combined with clinical observations, patient history, and epidemiological information. The expected result is Negative.  Fact Sheet for Patients:  https://www.moore.com/https://www.fda.gov/media/142436/download  Fact Sheet for Healthcare Providers:  https://www.young.biz/https://www.fda.gov/media/142435/download  This test is no t yet approved or cleared by the [place] FDA and  has been authorized for detection and/or diagnosis of SARS-CoV-2 by FDA under an Emergency Use Authorization (EUA). This EUA will remain  in effect (meaning this  test can be used) for the duration of the COVID-19 declaration under Section 564(b)(1) of the Act, 21 U.S.C. section 360bbb-3(b)(1), unless the authorization is terminated or revoked sooner.     Influenza A by PCR NEGATIVE NEGATIVE Final   Influenza B by PCR NEGATIVE NEGATIVE Final    Comment: (NOTE) The Xpert Xpress SARS-CoV-2/FLU/RSV assay is intended as an aid in  the diagnosis of influenza from Nasopharyngeal swab specimens and  should not be used as a sole basis for treatment. Nasal washings and  aspirates are unacceptable for Xpert Xpress SARS-CoV-2/FLU/RSV  testing.  Fact Sheet for Patients: https://www.moore.com/https://www.fda.gov/media/142436/download  Fact Sheet for Healthcare Providers: https://www.young.biz/https://www.fda.gov/media/142435/download  This test is not yet approved or cleared by the [place] FDA and  has been authorized for detection and/or diagnosis of SARS-CoV-2 by  FDA under an Emergency Use Authorization (EUA). This EUA will remain  in effect (meaning this test can be used) for the duration of the  Covid-19 declaration under Section 564(b)(1) of the Act, 21  U.S.C. section 360bbb-3(b)(1), unless the authorization is  terminated or revoked. Performed at [facility] [place], [address] [place], [place] [address]      Discharge Instructions:  Discharge Instructions    Ambulatory referral to Occupational Therapy   Complete by: As directed    Ambulatory referral to Physical Therapy   Complete by: As directed    Ambulatory referral to Speech Therapy   Complete by: As directed    Diet - low sodium heart healthy   Complete by: As directed    Discharge instructions   Complete by: As directed    Keep SBP <140 24 hour supervision Outpatient PT/OT/SLP   Increase activity slowly   Complete by: As directed      Allergies as of 05/18/2020      Reactions   Aspirin Other (See Comments)   High risk of intracerebral hemorrhage due to cerebral amyloid angiopathy, please avoid  all antiplatelets      Medication List    TAKE these medications   acetaminophen 500 MG tablet Commonly known as: TYLENOL Take 500 mg by mouth every 6 (six) hours as needed for headache.   amLODipine 5 MG tablet Commonly known as: NORVASC Take 5 mg by mouth daily. What changed: Another medication with the same name was removed. Continue taking this medication, and follow the directions you see here.   levETIRAcetam 750 MG tablet Commonly known as: KEPPRA Take 1 tablet (750 mg total) by mouth 2 (two) times daily.            Durable Medical Equipment  (From admission, onward)         Start     Ordered   05/18/20 1058  For home use only DME 3 n 1  Once        05/18/20 1057            Time coordinating discharge: 35 min  Signed:  Joseph Art DO  [facility] 05/18/2020, 11:14 AM

## 2020-05-18 NOTE — TOC Transition Note (Signed)
Transition of Care [facility]) - CM/SW Discharge Note   Patient Details  Name: Mariona Scholes MRN: [id number] Date of Birth: 03/29/1956  [facility]) CM/SW Contact:  Kermit Balo, RN Phone Number: 05/18/2020, 11:50 AM   Clinical Narrative:    Pt is discharging home with outpatient therapy. Pt prefers to attend in [facility]. CM faxed [place] outpatient rehab the orders for therapy. [phone] in 1 to be delivered to the room per [employer]. No new medications.  Pt has transportation home.    Final next level of care: OP Rehab Barriers to Discharge: Insurance Authorization, Barriers Unresolved (comment)   Patient Goals and CMS Choice     Choice offered to / list presented to : Patient  Discharge Placement                       Discharge Plan and Services                DME Arranged: 3-N-1 DME Agency: [employer] (charity services) Date DME Agency Contacted: 05/18/20   Representative spoke with at DME Agency: [employer]            Social Determinants of Health (SDOH) Interventions     Readmission Risk Interventions No flowsheet data found.

## 2020-05-18 NOTE — Evaluation (Addendum)
Speech Language Pathology Evaluation Patient Details Name: Stacy Flores MRN: [id number] DOB: Sep 15, 1955 Today's Date: 05/18/2020 Time: 0938-1829 SLP Time Calculation (min) (ACUTE ONLY): 17 min  Problem List:  Patient Active Problem List   Diagnosis Date Noted  . ICH (intracerebral hemorrhage) (HCC) 05/17/2020  . Acute cerebrovascular accident (CVA) (HCC) 03/01/2020  . Seizure disorder (HCC) 03/01/2020  . Muscle twitching 01/14/2020  . Weakness of left upper extremity 01/13/2020  . Cerebral amyloid angiopathy (CODE) 07/25/2019  . Essential hypertension 07/25/2019  . Hyperlipidemia 07/25/2019  . 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 Intracerebral hemorrhage (HCC) 07/21/2019   Past Medical History:  Past Medical History:  Diagnosis Date  . Hypertension   . Stroke [facility])    Past Surgical History:  Past Surgical History:  Procedure Laterality Date  . ABDOMINAL HYSTERECTOMY    . TONSILLECTOMY     HPI:  Patient is a 64 year old female who presented with L UE weakness, numbness, and spasms along with L facial droop and a change in speech. She has a history of an intracranial hemorrhage in December 2020 and recurrence in July 2021. CT displayed slight interval inc in size of previous punctate parenchymal hemorrhages involving B parieto-occipital regions with a few additional punctate hemorrhages involving R occipital region. NIH initially 6. Medical hx of previous CVAs, seizures, and HTN.   Assessment / Plan / Recommendation Clinical Impression   Pt presents with moderately severe cognitive deficits as verified by a score of 16/30 on the [place] Mental Status Exam (SLUMS).  Deficits were most notable for memory and problem solving.  Pt also has a left visual field deficit with inattention for which she requires min-mod assist to compensate.  Pt's speech was fluent and free from notable dysarthria or word finding impairment.  Pt and pt's daughter  report that pt is near her baseline from initial ICH and had  been struggling with her memory even prior to event.  Pt's family provided assistance with medications, finances, and other household responsibilities at baseline.  Pt would benefit from OP ST follow up at discharge to maximize functional independence and reduce burden of care in the home environment.      SLP Assessment  SLP Recommendation/Assessment: Patient needs continued Speech Lanaguage Pathology Services SLP Visit Diagnosis: Cognitive communication deficit (R41.841)    Follow Up Recommendations  Outpatient SLP    Frequency and Duration min 1 x/week         SLP Evaluation Cognition  Overall Cognitive Status: History of cognitive impairments - at baseline Arousal/Alertness: Awake/alert Orientation Level: Oriented X4 Attention: Sustained Sustained Attention: Appears intact Memory: Impaired Memory Impairment: Storage deficit;Retrieval deficit;Decreased recall of new information Awareness: Impaired Awareness Impairment: Anticipatory impairment Problem Solving: Impaired Problem Solving Impairment: Functional complex Executive Function: Organizing;Self Monitoring;Self Correcting Organizing: Impaired Organizing Impairment: Functional complex Self Monitoring: Impaired Self Monitoring Impairment: Functional complex Self Correcting: Impaired Self Correcting Impairment: Functional complex Safety/Judgment: Impaired       Comprehension  Auditory Comprehension Overall Auditory Comprehension: Appears within functional limits for tasks assessed    Expression Expression Primary Mode of Expression: Verbal Verbal Expression Overall Verbal Expression: Appears within functional limits for tasks assessed   Oral / Motor  Oral Motor/Sensory Function Overall Oral Motor/Sensory Function: Within functional limits Motor Speech Overall Motor Speech: Appears within functional limits for tasks assessed   GO                     Kaheem Halleck,  Stacy Flores 05/18/2020, 10:18 AM

## 2022-01-27 IMAGING — CT CT HEAD W/O CM
3 series · 15 of 47 positions shown, 18 images · non-contrast
Comparison: Prior CT from 05/16/2020.

CLINICAL DATA: Follow-up examination for acute stroke.

EXAM:
CT HEAD WITHOUT CONTRAST
TECHNIQUE: Contiguous axial images were obtained from the base of the skull
through the vertex without intravenous contrast.

[Series 3: head 5.0 h30s · axial · 0.38mm/px · z∈[-176,-51]mm · 9 of 31 slices shown, 12 images]
[im 3/31  brain]
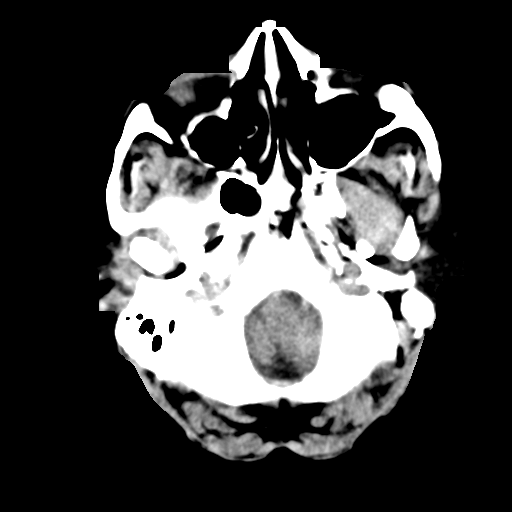
[im 3/31  bone]
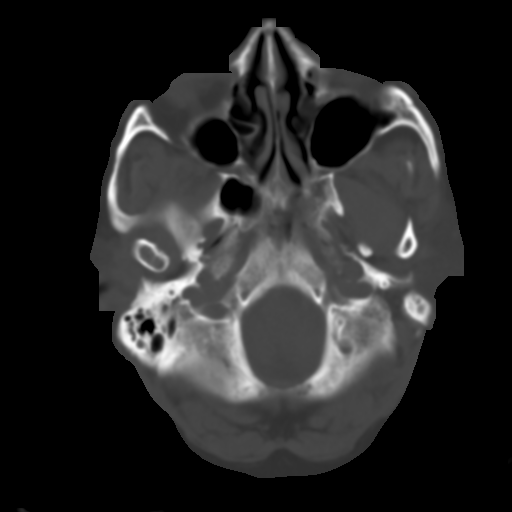
[im 6/31  brain]
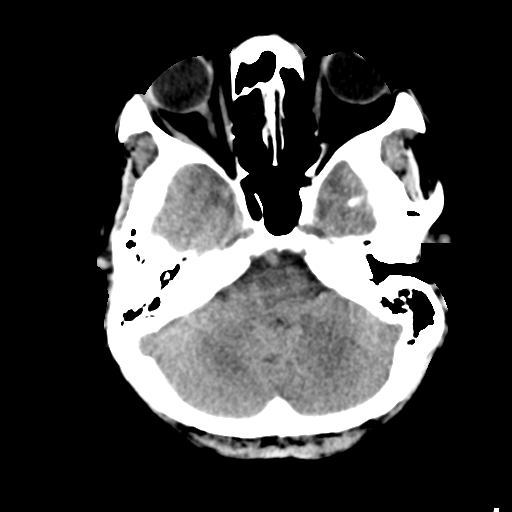
[im 9/31  brain]
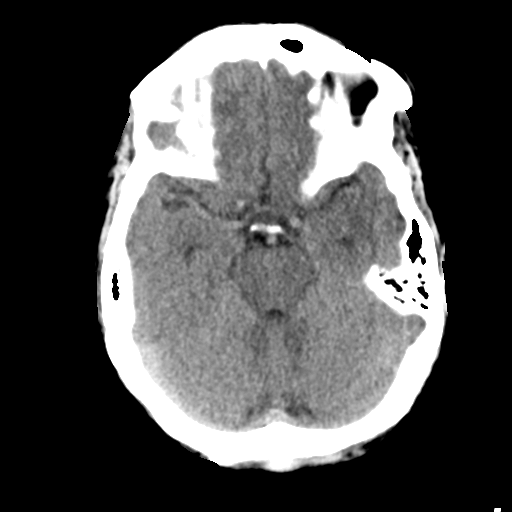
[im 12/31  brain]
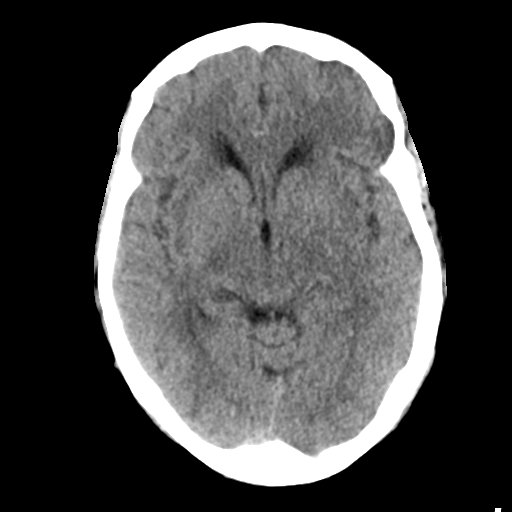
[im 16/31  brain]
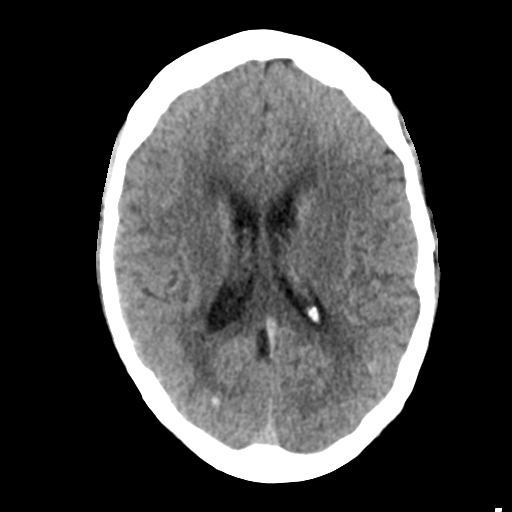
[im 16/31  bone]
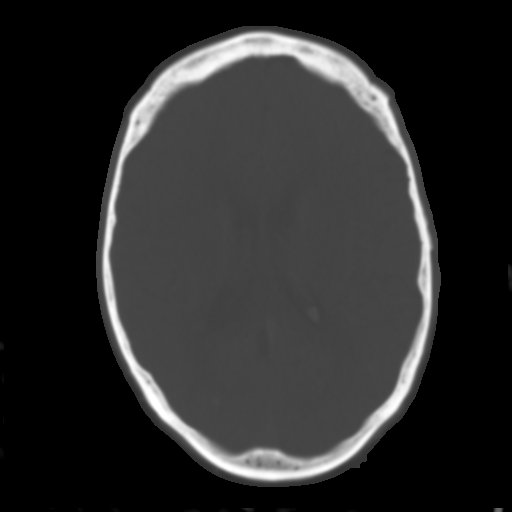
[im 19/31  brain]
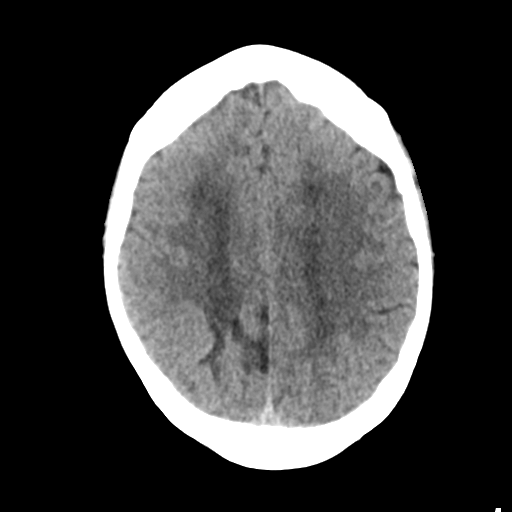
[im 22/31  brain]
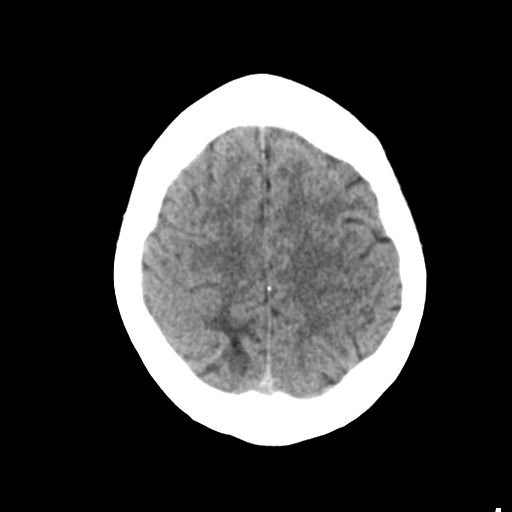
[im 25/31  brain]
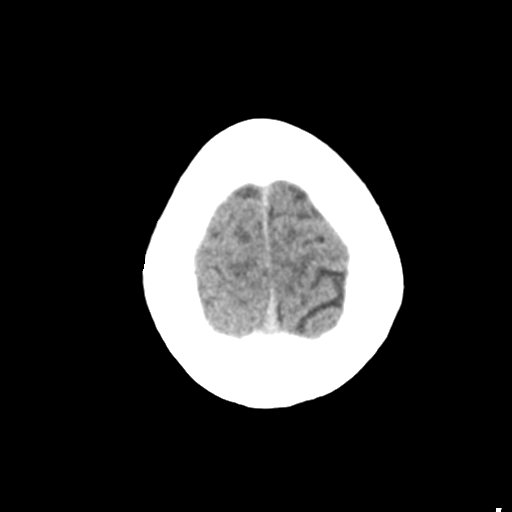
[im 28/31  brain]
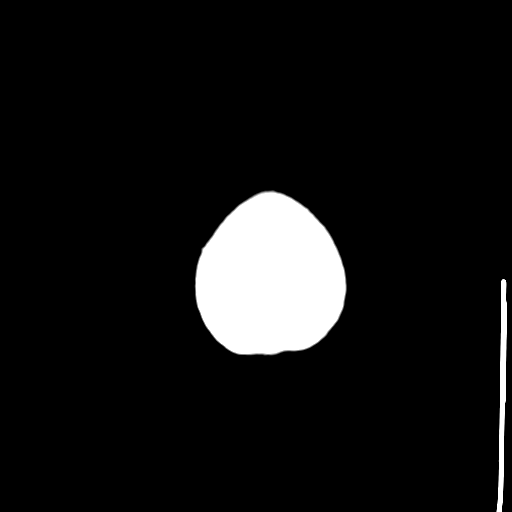
[im 28/31  bone]
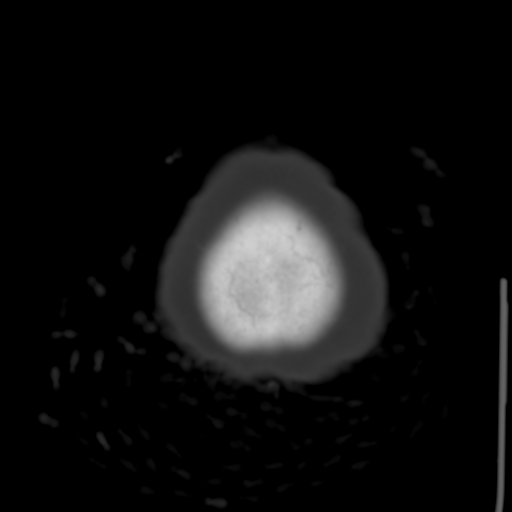

[Series 5: head 3.0 mpr cor · coronal · 0.29mm/px · 3 of 67 slices shown]
[im 23/67  brain]
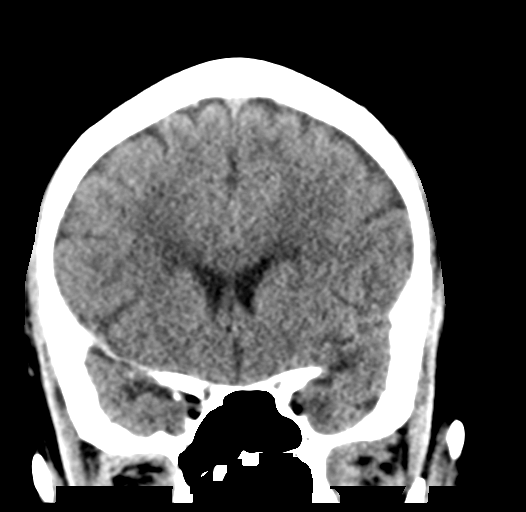
[im 30/67  brain]
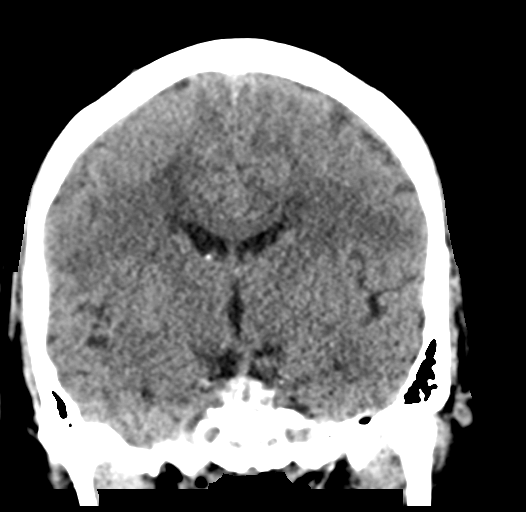
[im 37/67  brain]
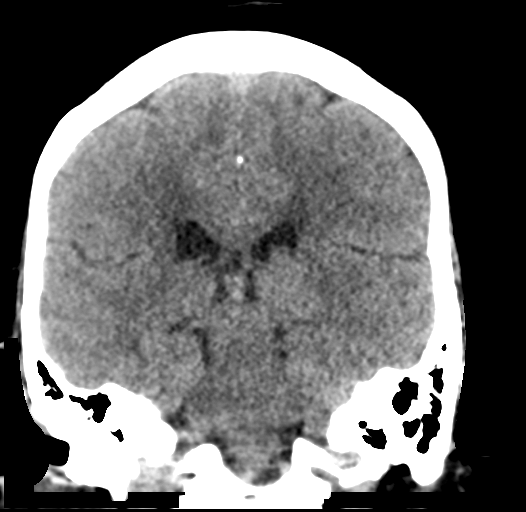

[Series 6: head 3.0 mpr sag · sagittal · 0.30mm/px · 3 of 49 slices shown]
[im 17/49  brain]
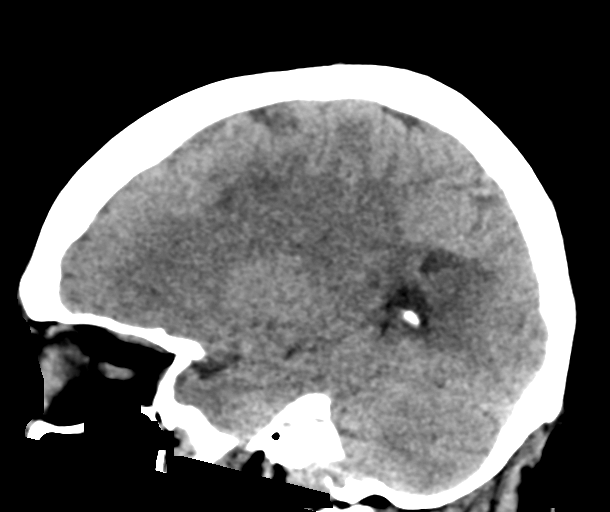
[im 25/49  brain]
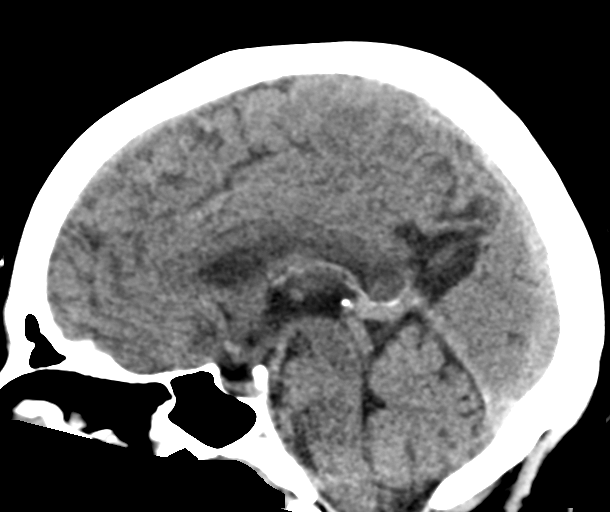
[im 33/49  brain]
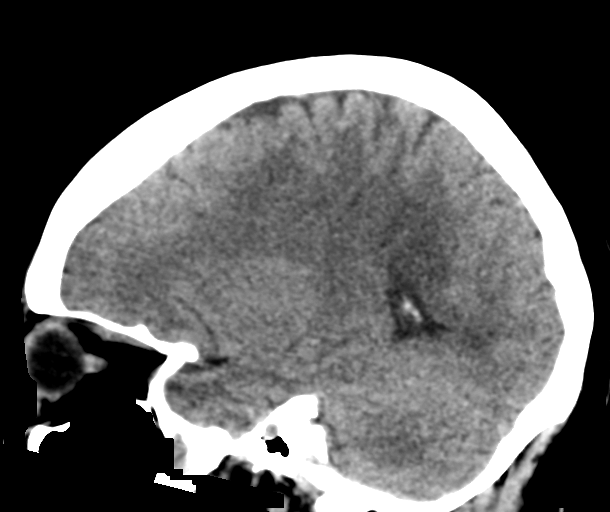

[15 of 47 positions shown; findings below may reference images not displayed]

FINDINGS: Brain: Previously identified punctate parenchymal hemorrhage at the
right parietal lobe again seen, which has mildly bloomed in the
interim and is slightly more prominent, now measuring up to 6 mm
(series 5, image 44). Additionally, there are at least 3 additional
punctate parenchymal hemorrhages within the adjacent right occipital
lobe (series 5, images 48, 51, 52). Findings are suspected to of
likely did present on prior exam, although are more now more
prominent. Additional subtle focus of parenchymal hemorrhage at the
left occipital lobe is also slightly more prominent measuring 4 mm
(series 5, image 47).

No other definite new hemorrhage. No significant mass effect.
Encephalomalacia and gliosis at the right parietal lobe related to
prior hemorrhage again noted. No other acute large vessel territory
infarct. No extra-axial collection.

Vascular: No hyperdense vessel.

Skull: Scalp soft tissues within normal limits.  Calvarium intact.

Sinuses/Orbits: Globes and orbital soft tissues within normal
limits. Paranasal sinuses remain largely clear. Trace bilateral
mastoid effusions noted.

Other: None.
IMPRESSION: 1. Slight interval increase in conspicuity/size of previously
identified punctate parenchymal hemorrhages involving the bilateral
parieto-occipital regions, with a few additional punctate
hemorrhages involving the right occipital region as above. These
additional small hemorrhages are suspected to likely have been
present on recent CT, but were extremely difficult to visualize at
that time. No significant mass effect.
2. No other new acute intracranial abnormality.

## 2023-12-28 ENCOUNTER — Emergency Department (HOSPITAL_BASED_OUTPATIENT_CLINIC_OR_DEPARTMENT_OTHER)
Admission: EM | Admit: 2023-12-28 | Discharge: 2023-12-29 | Disposition: A | Discharge status: Home / Self Care | Payer: Medicare (Managed Care) | Attending: Emergency Medicine | Admitting: Emergency Medicine

## 2023-12-28 ENCOUNTER — Emergency Department (HOSPITAL_BASED_OUTPATIENT_CLINIC_OR_DEPARTMENT_OTHER): Payer: Medicare (Managed Care)

## 2023-12-28 ENCOUNTER — Other Ambulatory Visit: Payer: Self-pay

## 2023-12-28 ENCOUNTER — Encounter (HOSPITAL_BASED_OUTPATIENT_CLINIC_OR_DEPARTMENT_OTHER): Payer: Self-pay

## 2023-12-28 DIAGNOSIS — R42 Dizziness and giddiness: Secondary | ICD-10-CM | POA: Insufficient documentation

## 2023-12-28 DIAGNOSIS — R112 Nausea with vomiting, unspecified: Secondary | ICD-10-CM | POA: Diagnosis not present

## 2023-12-28 LAB — COMPREHENSIVE METABOLIC PANEL WITH GFR
ALT: 28 U/L (ref 0–44)
AST: 29 U/L (ref 15–41)
Albumin: 5.2 g/dL — ABNORMAL HIGH (ref 3.5–5.0)
Alkaline Phosphatase: 91 U/L (ref 38–126)
Anion gap: 18 — ABNORMAL HIGH (ref 5–15)
BUN: 9 mg/dL (ref 8–23)
CO2: 24 mmol/L (ref 22–32)
Calcium: 10 mg/dL (ref 8.9–10.3)
Chloride: 96 mmol/L — ABNORMAL LOW (ref 98–111)
Creatinine, Ser: 0.68 mg/dL (ref 0.44–1.00)
GFR, Estimated: >60 mL/min (ref >60)
Glucose, Bld: 185 mg/dL — ABNORMAL HIGH (ref 70–99)
Potassium: 3.9 mmol/L (ref 3.5–5.1)
Sodium: 139 mmol/L (ref 135–145)
Total Bilirubin: 0.4 mg/dL (ref 0.0–1.2)
Total Protein: 8.9 g/dL — ABNORMAL HIGH (ref 6.5–8.1)

## 2023-12-28 LAB — MAGNESIUM: Magnesium: 1.8 mg/dL (ref 1.7–2.4)

## 2023-12-28 LAB — URINALYSIS, MICROSCOPIC (REFLEX): WBC, UA: NONE SEEN WBC/hpf (ref 0–5)

## 2023-12-28 LAB — URINALYSIS, ROUTINE W REFLEX MICROSCOPIC
Bilirubin Urine: NEGATIVE
Glucose, UA: >=500 mg/dL — AB
Ketones, ur: 15 mg/dL — AB
Leukocytes,Ua: NEGATIVE
Nitrite: NEGATIVE
Protein, ur: 30 mg/dL — AB
Specific Gravity, Urine: 1.02 (ref 1.005–1.030)
pH: 7 (ref 5.0–8.0)

## 2023-12-28 LAB — CBC
HCT: 42.6 % (ref 36.0–46.0)
Hemoglobin: 14.2 g/dL (ref 12.0–15.0)
MCH: 28.6 pg (ref 26.0–34.0)
MCHC: 33.3 g/dL (ref 30.0–36.0)
MCV: 85.9 fL (ref 80.0–100.0)
Platelets: 289 10*3/uL (ref 150–400)
RBC: 4.96 MIL/uL (ref 3.87–5.11)
RDW: 14.4 % (ref 11.5–15.5)
WBC: 10.4 10*3/uL (ref 4.0–10.5)
nRBC: 0 % (ref 0.0–0.2)

## 2023-12-28 LAB — CBG MONITORING, ED: Glucose-Capillary: 185 mg/dL — ABNORMAL HIGH (ref 70–99)

## 2023-12-28 MED ORDER — SODIUM CHLORIDE 0.9 % IV SOLN: INTRAVENOUS | Status: DC

## 2023-12-28 MED ORDER — LEVETIRACETAM (KEPPRA) 500 MG/5 ML ADULT IV PUSH
1500.0000 mg | Freq: Once | INTRAVENOUS | Status: AC
  Administered 2023-12-29: 1500 mg via INTRAVENOUS
  Filled 2023-12-28: qty 15

## 2023-12-28 MED ORDER — MECLIZINE HCL 25 MG PO TABS
25.0000 mg | ORAL_TABLET | Freq: Once | ORAL | Status: AC
  Administered 2023-12-28: 25 mg via ORAL
  Filled 2023-12-28: qty 1

## 2023-12-28 MED ORDER — SODIUM CHLORIDE 0.9 % IV BOLUS
500.0000 mL | Freq: Once | INTRAVENOUS | Status: AC
  Administered 2023-12-28: 500 mL via INTRAVENOUS

## 2023-12-28 MED ORDER — ONDANSETRON HCL 4 MG/2ML IJ SOLN
4.0000 mg | Freq: Once | INTRAMUSCULAR | Status: AC
  Administered 2023-12-28: 4 mg via INTRAVENOUS
  Filled 2023-12-28: qty 2

## 2023-12-28 NOTE — ED Notes (Signed)
 Pt states she has been dizzy all day with nausea and vomiting. Pt has not had her Keppra  in 3 days, dtg picked up her refill today. Pt has not been able to eat or drink all day, she tries and she immediately vomits

## 2023-12-28 NOTE — ED Provider Notes (Addendum)
 [place] [facility] AT [facility] Provider Note   CSN: [id number] Arrival date & time: 12/28/23  1856     History  Chief Complaint  Patient presents with   Dizziness    Stacy Flores is a 68 y.o. female.  Patient brought in by family members for dizziness.  They were concerned about possible brain bleed she has a history of seizures following previous brain bleed.  Patient has quite extensive history of of cerebral hemorrhage.  Chart review's shows that in October 2021 she had a nontraumatic intracerebral hemorrhage with admission then in March 01, 2020 had a CVA.  In February 23, 2020 had intracranial bleed.  And then in December 2020 had a intercranial hemorrhage.  Not clear exactly what happened today.  Bit of patient's had nausea and vomiting there is a degree of dizziness without room spinning.  Not clear which one started first.  Patient not able to really state.  Patient denies any room spinning currently no increased dizziness with moving her head left to right.  Patient is also supposed to be on Keppra  to help protect against seizures does not sound as if there was any seizure-like activity.  Patient has vomited several times today.  And has had some muscle cramps related to that.  No headache.  No focal numbness or weakness.  The only strokelike symptom would be the dizziness which is not reproducible.  Patient supposed to be on Keppra  750 mg tablet 2 times daily.  Patient ran out of that medicine just got it refilled today.  But did not take any doses.  No abdominal pain no diarrhea.  No chest pain.       Home Medications Prior to Admission medications   Medication Sig Start Date End Date Taking? Authorizing Provider  acetaminophen  (TYLENOL ) 500 MG tablet Take 500 mg by mouth every 6 (six) hours as needed for headache.    [provider]  amLODipine  (NORVASC ) 5 MG tablet Take 5 mg by mouth daily. 05/09/20   [provider]  levETIRAcetam   (KEPPRA ) 750 MG tablet Take 1 tablet (750 mg total) by mouth 2 (two) times daily. 02/26/20   Rinehuls, Helga Loan, PA-C      Allergies    Aspirin     Review of Systems   Review of Systems  Constitutional:  Negative for chills and fever.  HENT:  Negative for ear pain and sore throat.   Eyes:  Negative for pain and visual disturbance.  Respiratory:  Negative for cough and shortness of breath.   Cardiovascular:  Negative for chest pain and palpitations.  Gastrointestinal:  Positive for nausea and vomiting. Negative for abdominal pain.  Genitourinary:  Negative for dysuria and hematuria.  Musculoskeletal:  Negative for arthralgias and back pain.  Skin:  Negative for color change and rash.  Neurological:  Positive for dizziness. Negative for seizures and syncope.  All other systems reviewed and are negative.   Physical Exam Updated Vital Signs BP (!) 143/75 (BP Location: Right Arm)   Pulse 90   Temp 97.9 F (36.6 C) (Oral)   Resp 16   Wt 68 kg   SpO2 100%   BMI 26.57 kg/m  Physical Exam Vitals and nursing note reviewed.  Constitutional:      General: She is not in acute distress.    Appearance: Normal appearance. She is well-developed. She is not ill-appearing.  HENT:     Head: Normocephalic and atraumatic.     Mouth/Throat:  Mouth: Mucous membranes are dry.  Eyes:     Extraocular Movements: Extraocular movements intact.     Conjunctiva/sclera: Conjunctivae normal.     Pupils: Pupils are equal, round, and reactive to light.  Cardiovascular:     Rate and Rhythm: Normal rate and regular rhythm.     Heart sounds: No murmur heard. Pulmonary:     Effort: Pulmonary effort is normal. No respiratory distress.     Breath sounds: Normal breath sounds.  Abdominal:     Palpations: Abdomen is soft.     Tenderness: There is no abdominal tenderness.  Musculoskeletal:        General: No swelling.     Cervical back: Normal range of motion and neck supple. No rigidity.  Skin:     General: Skin is warm and dry.     Capillary Refill: Capillary refill takes less than 2 seconds.  Neurological:     Mental Status: She is alert. Mental status is at baseline.     Cranial Nerves: Cranial nerve deficit present.     Sensory: No sensory deficit.     Motor: No weakness.     Comments: Moving chin left to right back to center brings on a little bit of dizziness but not room spinning.  No weakness to lower extremities no weakness to upper extremity.  No speech abnormalities.  Family does state that she has baseline with her confusion.  Psychiatric:        Mood and Affect: Mood normal.     ED Results / Procedures / Treatments   Labs (all labs ordered are listed, but only abnormal results are displayed) Labs Reviewed  COMPREHENSIVE METABOLIC PANEL WITH GFR - Abnormal; Notable for the following components:      Result Value   Chloride 96 (*)    Glucose, Bld 185 (*)    Total Protein 8.9 (*)    Albumin 5.2 (*)    Anion gap 18 (*)    All other components within normal limits  URINALYSIS, ROUTINE W REFLEX MICROSCOPIC - Abnormal; Notable for the following components:   Glucose, UA >=500 (*)    Hgb urine dipstick TRACE (*)    Ketones, ur 15 (*)    Protein, ur 30 (*)    All other components within normal limits  URINALYSIS, MICROSCOPIC (REFLEX) - Abnormal; Notable for the following components:   Bacteria, UA RARE (*)    All other components within normal limits  CBG MONITORING, ED - Abnormal; Notable for the following components:   Glucose-Capillary 185 (*)    All other components within normal limits  CBC    EKG EKG Interpretation Date/Time:  Monday Dec 28 2023 19:39:38 EDT Ventricular Rate:  79 PR Interval:  127 QRS Duration:  90 QT Interval:  424 QTC Calculation: 487 R Axis:   26  Text Interpretation: Sinus rhythm Consider left atrial enlargement Low voltage, precordial leads Abnormal T, consider ischemia, diffuse leads Confirmed by Sarann Tregre [phone]) on  12/28/2023 7:43:19 PM  Radiology CT HEAD WO CONTRAST Result Date: 12/28/2023 CLINICAL DATA:  Neuro deficit, acute, stroke suspected dizziness and vomiting that started today. Pt has hx of brain bleed and seizures. Pt has not had her keppra  in 3 days. Pt denies headache and generalized weakness. Pt aANDox4. EXAM: CT HEAD WITHOUT CONTRAST TECHNIQUE: Contiguous axial images were obtained from the base of the skull through the vertex without intravenous contrast. RADIATION DOSE REDUCTION: This exam was performed according to the departmental dose-optimization program which  includes automated exposure control, adjustment of the mA and/or kV according to patient size and/or use of iterative reconstruction technique. COMPARISON:  CT head 06/03/2022 FINDINGS: Brain: Similar-appearing right cerebral encephalomalacia. Left parieto-occipital encephalomalacia. Patchy and confluent areas of decreased attenuation are noted throughout the deep and periventricular white matter of the cerebral hemispheres bilaterally, compatible with chronic microvascular ischemic disease. No evidence of large-territorial acute infarction. No parenchymal hemorrhage. No mass lesion. No extra-axial collection. No mass effect or midline shift. No hydrocephalus. Basilar cisterns are patent. Vascular: No hyperdense vessel. Atherosclerotic calcifications are present within the cavernous internal carotid arteries. Skull: No acute fracture or focal lesion. Sinuses/Orbits: Paranasal sinuses and mastoid air cells are clear. The orbits are unremarkable. Other: None. IMPRESSION: No acute intracranial abnormality in a patient with prior cerebral infarctions and chronic microvascular ischemic changes. Electronically Signed   By: Morgane  Naveau M.D.   On: 12/28/2023 20:49    Procedures Procedures    Medications Ordered in ED Medications  ondansetron  (ZOFRAN ) injection 4 mg (has no administration in time range)  0.9 %  sodium chloride  infusion (has no  administration in time range)  sodium chloride  0.9 % bolus 500 mL (has no administration in time range)    ED Course/ Medical Decision Making/ A&P                                 Medical Decision Making Amount and/or Complexity of Data Reviewed Labs: ordered. Radiology: ordered.  Risk Prescription drug management.   Urinalysis ketones are up some.  But not consistent with urinary tract infection.  Some blood sugar in the urine.  But her blood sugar in her blood was 185.  Complete metabolic panel CO2 is normal at 24 potassium sodium is normal glucose was 185 renal function GFR greater than 60 LFTs are normal.  Anion gap a little bit at 18.  White blood cell count 10.4 hemoglobin 14.2 platelets 289.  Head CT no acute intercranial abnormality and patient with prior cerebral infarctions.  The EKG did have some abnormal T waves.  Will get troponins just to be complete.  Will give some IV fluids.  Will give some antinausea medicine.  Patient will need to be sent in for MRI she will need Ativan  for the MRI.  Difficult history to determine whether there was a new type of stroke related to the dizziness.  MRI required to rule that in or rule that out.  Will discuss with neurology to see if they want to do MRA.  Discussed with on-call neurology.  Dr. Murvin Arthurs, states that further imaging would not be helpful because there is not anything additional that her on if she did have a mini stroke because of her history of the amyloid problem.  She certainly is at risk for recurrent strokes and recurrent bleeds.  With pretty much ruled out a bleed based on the head CT of any significance.  He is not recommending any additional imaging because they would be no intervention.  Will give her IV fluids will give her Zofran  will give her Antivert .  Will reassess to see if she is able to stand any better with that.  If she is not she may require admission hospitalist service.  But neurology does not have any  intervention or additional studies that would lead to any specific intervention.  Patient feeling much better on the Zofran  and the Antivert .  Family wants to take her  home.  The bed stay with her.  They feel comfortable with the discharge.   Final Clinical Impression(s) / ED Diagnoses Final diagnoses:  Dizziness    Rx / DC Orders ED Discharge Orders     None         Nicklas Barns, MD 12/28/23 [id number]    Nicklas Barns, MD 12/28/23 [id number]    Nicklas Barns, MD 12/29/23 0040

## 2023-12-28 NOTE — ED Triage Notes (Signed)
 Pt arrives with c/o dizziness and vomiting that started today. Pt has hx of brain bleed and seizures. Pt has not had her keppra  in 3 days. Pt denies headache and generalized weakness. Pt a&ox4.

## 2023-12-29 MED ORDER — ONDANSETRON 4 MG PO TBDP: 4.0000 mg | ORAL_TABLET | Freq: Three times a day (TID) | ORAL | 0 refills | Status: AC | PRN

## 2023-12-29 MED ORDER — MECLIZINE HCL 25 MG PO TABS: 25.0000 mg | ORAL_TABLET | Freq: Three times a day (TID) | ORAL | 0 refills | Status: AC | PRN

## 2023-12-29 NOTE — Discharge Instructions (Signed)
 Discussed with neurology at [facility].  They did not recommend MRI intervention because they would not do anything different because of her history of the amyloid of the brain and the bleeds in the past.  They did say that she is at risk for small stroke so that is a possibility but they would not do anything any different.  Said she was okay for discharge home.  If she was feeling better.

## 2023-12-29 NOTE — ED Notes (Signed)
 Pt feels much better , no dizziness or nausea at this time Pt ambulated to [facility] without assisstance
